# Patient Record
Sex: Male | Born: 1955
Health system: Southern US, Community
[De-identification: ages and names within clinical notes are randomized; demographics above are authoritative.]

## PROBLEM LIST (undated history)

## (undated) DIAGNOSIS — I4891 Unspecified atrial fibrillation: Secondary | ICD-10-CM

## (undated) DIAGNOSIS — N179 Acute kidney failure, unspecified: Secondary | ICD-10-CM

## (undated) DIAGNOSIS — D649 Anemia, unspecified: Secondary | ICD-10-CM

## (undated) DIAGNOSIS — I671 Cerebral aneurysm, nonruptured: Secondary | ICD-10-CM

## (undated) DIAGNOSIS — I639 Cerebral infarction, unspecified: Secondary | ICD-10-CM

## (undated) DIAGNOSIS — I10 Essential (primary) hypertension: Secondary | ICD-10-CM

## (undated) HISTORY — DX: Essential (primary) hypertension: I10

## (undated) HISTORY — DX: Unspecified atrial fibrillation: I48.91

## (undated) HISTORY — PX: KNEE SURGERY: SHX244

## (undated) HISTORY — PX: APPENDECTOMY: SHX54

---

## 2018-12-03 ENCOUNTER — Encounter: Payer: Self-pay | Admitting: Cardiology

## 2018-12-03 ENCOUNTER — Ambulatory Visit (INDEPENDENT_AMBULATORY_CARE_PROVIDER_SITE_OTHER): Payer: Self-pay | Admitting: Cardiology

## 2018-12-03 ENCOUNTER — Other Ambulatory Visit: Payer: Self-pay

## 2018-12-03 VITALS — BP 180/110 | HR 81 | Ht 63.0 in | Wt 181.8 lb

## 2018-12-03 DIAGNOSIS — I10 Essential (primary) hypertension: Secondary | ICD-10-CM

## 2018-12-03 DIAGNOSIS — E785 Hyperlipidemia, unspecified: Secondary | ICD-10-CM

## 2018-12-03 DIAGNOSIS — E669 Obesity, unspecified: Secondary | ICD-10-CM

## 2018-12-03 DIAGNOSIS — I480 Paroxysmal atrial fibrillation: Secondary | ICD-10-CM

## 2018-12-03 HISTORY — DX: Essential (primary) hypertension: I10

## 2018-12-03 HISTORY — DX: Obesity, unspecified: E66.9

## 2018-12-03 HISTORY — DX: Hyperlipidemia, unspecified: E78.5

## 2018-12-03 HISTORY — DX: Paroxysmal atrial fibrillation: I48.0

## 2018-12-03 MED ORDER — CARVEDILOL 6.25 MG PO TABS: 6.2500 mg | ORAL_TABLET | Freq: Two times a day (BID) | ORAL | 1 refills | Status: DC

## 2018-12-03 MED ORDER — APIXABAN 5 MG PO TABS: 5.0000 mg | ORAL_TABLET | Freq: Two times a day (BID) | ORAL | 5 refills | Status: DC

## 2018-12-03 NOTE — Progress Notes (Signed)
Cardiology Office Note:    Date:  12/03/2018   ID:  Blake Carey, DOB 1955-03-04, MRN [id number]  PCP:  Imagene Riches, NP  Cardiologist:  Berniece Salines, DO  Electrophysiologist:  None   Referring MD: Imagene Riches, NP   Patient was referred for atrial fibrillation.  History of Present Illness:    Blake Carey is a 63 y.o. male with a hx of hypertension diagnosed 8 years ago at that time he was told to diet and exercise, atrial fibrillation which was diagnosed yesterday presents today to be evaluated for both his hypertension as well as A. fib.  Today he denies any chest, shortness of breath, nausea, vomiting.  Past Medical History:  Diagnosis Date  . Atrial fibrillation ([place])   . HTN (hypertension)     Past Surgical History:  Procedure Laterality Date  . APPENDECTOMY    . KNEE SURGERY Right     Current Medications: Current Meds  Medication Sig  . lisinopril (ZESTRIL) 10 MG tablet Take 10 mg by mouth daily.     Allergies:   Patient has no known allergies.   Social History   Socioeconomic History  . Marital status: Unknown    Spouse name: Not on file  . Number of children: Not on file  . Years of education: Not on file  . Highest education level: Not on file  Occupational History  . Not on file  Social Needs  . Financial resource strain: Not on file  . Food insecurity    Worry: Not on file    Inability: Not on file  . Transportation needs    Medical: Not on file    Non-medical: Not on file  Tobacco Use  . Smoking status: Never Smoker  . Smokeless tobacco: Never Used  Substance and Sexual Activity  . Alcohol use: Never    Frequency: Never  . Drug use: Never  . Sexual activity: Not on file  Lifestyle  . Physical activity    Days per week: Not on file    Minutes per session: Not on file  . Stress: Not on file  Relationships  . Social Herbalist on phone: Not on file    Gets together: Not on file    Attends religious service: Not on  file    Active member of club or organization: Not on file    Attends meetings of clubs or organizations: Not on file    Relationship status: Not on file  Other Topics Concern  . Not on file  Social History Narrative  . Not on file     Family History: The patient's family history includes Prostate cancer in his father.  ROS:   Review of Systems  Constitution: Negative for decreased appetite, fever and weight gain.  HENT: Negative for congestion, ear discharge, hoarse voice and sore throat.   Eyes: Negative for discharge, redness, vision loss in right eye and visual halos.  Cardiovascular: Negative for chest pain, dyspnea on exertion, leg swelling, orthopnea and palpitations.  Respiratory: Negative for cough, hemoptysis, shortness of breath and snoring.   Endocrine: Negative for heat intolerance and polyphagia.  Hematologic/Lymphatic: Negative for bleeding problem. Does not bruise/bleed easily.  Skin: Negative for flushing, nail changes, rash and suspicious lesions.  Musculoskeletal: Negative for arthritis, joint pain, muscle cramps, myalgias, neck pain and stiffness.  Gastrointestinal: Negative for abdominal pain, bowel incontinence, diarrhea and excessive appetite.  Genitourinary: Negative for decreased libido, genital sores and incomplete emptying.  Neurological:  Negative for brief paralysis, focal weakness, headaches and loss of balance.  Psychiatric/Behavioral: Negative for altered mental status, depression and suicidal ideas.  Allergic/Immunologic: Negative for HIV exposure and persistent infections.    EKGs/Labs/Other Studies Reviewed:    The following studies were reviewed today:   EKG:  The ekg ordered today demonstrates atrial fibrillation with controlled ventricular rate heart rate 81 bpm.  Change from prior EKG performed on 12/01/2018.  Recent Labs: No results found for requested labs within last 8760 hours.  Chemistry: Sodium 140, potassium 4.5, chloride 107, bicarb  27, BUN 19, creatinine 1.03, serum 9.3, albumin 4.2, alk phos 77, AST 23, ALT 19, CBC: WBC 5.9, hemoglobin 14.3, hematocrit 47, PLT 203  Recent Lipid Panel No results found for: CHOL, TRIG, HDL, CHOLHDL, VLDL, LDLCALC, LDLDIRECT   Lipid profile: Performed 12/02/2018 LDL 207, triglyceride 92, HDL 37 total cholesterol 268.  Physical Exam:    VS:  BP (!) 180/110 (BP Location: Right Arm)   Pulse 81   Ht '5\' 3"'  (1.6 m)   Wt 181 lb 12.8 oz (82.5 kg)   BMI 32.20 kg/m     Wt Readings from Last 3 Encounters:  12/03/18 181 lb 12.8 oz (82.5 kg)    GEN: Obese male, well nourished, well developed in no acute distress HEENT: Normal NECK: No JVD; No carotid bruits LYMPHATICS: No lymphadenopathy CARDIAC: S1S2 noted,RRR, no murmurs, rubs, gallops RESPIRATORY:  Clear to auscultation without rales, wheezing or rhonchi  ABDOMEN: Soft, non-tender, non-distended, +bowel sounds, no guarding. EXTREMITIES: No edema, No cyanosis, no clubbing MUSCULOSKELETAL:  No edema; No deformity  SKIN: Warm and dry NEUROLOGIC:  Alert and oriented x 3, non-focal PSYCHIATRIC:  Normal affect, good insight  ASSESSMENT:    1. Essential hypertension   2. Paroxysmal atrial fibrillation (HCC)   3. Dyslipidemia   4. Obesity (BMI 30-39.9)    PLAN:    1.  Hypertension-patient is hypertensive in the office today is manual blood pressure is 180/110 mmHg.  He has been started on lisinopril 10 mg daily which he has not picked up.  Some going to continue him on lisinopril 10 mg daily.  I would going to add Coreg 6.25 mg twice daily.  He has been advised about decrease salt intake.  He tells me he is recently in his diet to more healthy diet and stop eating red meats.  Also has increased his exercise.  2.  Atrial fibrillation-the patient is in atrial fibrillation today in the office.  I have started him on Coreg 25 twice daily which will help with his rate control as well as his blood pressure.  His CHadsVasc Score is 2.  I will  start the patient on anticoagulation with Eliquis 5 mg twice daily.  If this does not happen we will hopefully after 4 weeks be able to safely perform a DC cardioversion.  3.  Hyperlipidemia-h his LDL is 206 and his HDL is 37.  I did educate patient that he should ideally be on a moderate dose intensity statin but at this time he has declined to start this medication.  He prefers to go with lifestyle modification.  Is that he is change his eating habits and he has been losing weight.  I will repeat this testing in 4-8 weeks.   4.  Obesity-the patient understands the need to lose weight with diet and exercise. We have discussed specific strategies for this.  The patient is in agreement with the above plan. The patient left the office  in stable condition.  The patient will follow up in 4 months.    Medication Adjustments/Labs and Tests Ordered: Current medicines are reviewed at length with the patient today.  Concerns regarding medicines are outlined above.  Orders Placed This Encounter  Procedures  . Lipid Profile  . EKG 12-Lead  . ECHOCARDIOGRAM COMPLETE   Meds ordered this encounter  Medications  . carvedilol (COREG) 6.25 MG tablet    Sig: Take 1 tablet (6.25 mg total) by mouth 2 (two) times daily.    Dispense:  180 tablet    Refill:  1  . apixaban (ELIQUIS) 5 MG TABS tablet    Sig: Take 1 tablet (5 mg total) by mouth 2 (two) times daily.    Dispense:  60 tablet    Refill:  5    Patient Instructions  Medication Instructions:  Your physician has recommended you make the following change in your medication:   START: Coreg 6.25 mg Take 1 tablet twice daily with meals START: Eliquis 5 mg Take 1 tablet twice daily  *If you need a refill on your cardiac medications before your next appointment, please call your pharmacy*  Lab Work: Your physician recommends that you return for lab work in:   When you get your echo: Fasting Lipid  If you have labs (blood work) drawn today and  your tests are completely normal, you will receive your results only by: Marland Kitchen MyChart Message (if you have MyChart) OR . A paper copy in the mail If you have any lab test that is abnormal or we need to change your treatment, we will call you to review the results.  Testing/Procedures: Your physician has requested that you have an echocardiogram. Echocardiography is a painless test that uses sound waves to create images of your heart. It provides your doctor with information about the size and shape of your heart and how well your heart's chambers and valves are working. This procedure takes approximately one hour. There are no restrictions for this procedure.   Follow-Up: At [facility], you and your health needs are our priority.  As part of our continuing mission to provide you with exceptional heart care, we have created designated Provider Care Teams.  These Care Teams include your primary Cardiologist (physician) and Advanced Practice Providers (APPs -  Physician Assistants and Nurse Practitioners) who all work together to provide you with the care you need, when you need it.  Your next appointment:   4 weeks  The format for your next appointment:   In Person  Provider:   Berniece Salines, DO  Other Instructions    Ecocardiograma Echocardiogram Un ecocardiograma es un procedimiento que [place] ondas sonoras indoloras (ultrasonido) para obtener una imagen del corazn. Las imgenes de un ecocardiograma pueden proporcionar informacin importante sobre lo siguiente:  Signos de arteriopata coronaria (EAC).  Signos de aneurisma. Un aneurisma es una zona debilitada o daada de la pared de una arteria que se abulta por la fuerza normal del bombeo de la sangre a travs del cuerpo.  Tamao y forma del corazn. Los [employer] tamao y la forma del corazn se pueden asociar a determinadas afecciones, como insuficiencia cardaca, aneurisma y [place].  Funcin del msculo cardaco.  Funcin de la  vlvula cardaca.  Signos de un infarto de miocardio previo.  Acumulacin de lquido alrededor del corazn.  Engrosamiento del msculo cardaco.  Un tumor o crecimiento infeccioso alrededor de las vlvulas cardacas. Informe al mdico acerca de lo siguiente:  Cualquier  alergia que tenga.  Todos los [employer] consume, incluidos vitaminas, hierbas, gotas oftlmicas, cremas y medicamentos de venta libre.  Cualquier enfermedad de la sangre que tenga.  Cirugas a las que se someti.  Cualquier enfermedad que tenga.  Si est embarazada o podra estarlo. Cules son los riesgos? En general, se trata de un procedimiento seguro. Sin embargo, pueden ocurrir complicaciones, por ejemplo:  Reaccin alrgica al tinte de contraste utilizado en el procedimiento. Qu ocurre antes del procedimiento? No se requiere Associate Professor. Podr comer y beber normalmente. Qu ocurre durante el procedimiento?   Pueden colocarle un tubo (catter) intravenoso en una de las venas.  Puede recibir Programmer, multimedia a travs de esta va. Un contraste es una inyeccin que mejora la calidad de las imgenes del corazn.  Le aplicarn un gel sobre el pecho.  Le pasarn un instrumento similar a una vara (transductor) sobre el pecho. El gel ayudar a transmitir las [employer] del transductor.  Las ondas sonoras rebotarn inofensivamente en el corazn para permitir capturar las imgenes del corazn en movimiento, en tiempo real. Las imgenes se registrarn en una computadora. Este procedimiento puede variar segn el mdico y el hospital. Sander Nephew sucede despus del procedimiento?  Puede retomar su rutina diaria normal, incluidos la dieta, las actividades y [employer], a menos que su mdico le indique lo contrario. Resumen  Un ecocardiograma es un procedimiento que [place] ondas sonoras indoloras (ultrasonido) para obtener una imagen del corazn.  Las imgenes de un ecocardiograma pueden brindar  informacin importante sobre el tamao y la forma del corazn, la funcin del msculo cardaco, la funcin de la vlvula cardaca y la acumulacin de lquido alrededor del Training and development officer.  No es necesario prepararse para este procedimiento. Podr comer y beber normalmente.  Al finalizar el ecocardiograma, puede retomar su rutina diaria normal, a menos que su mdico le indique lo contrario. Esta informacin no tiene Marine scientist el consejo del mdico. Asegrese de hacerle al mdico cualquier pregunta que tenga. Document Released: 01/15/2005 Document Revised: 10/25/2016 Document Reviewed: 05/11/2016 Elsevier Patient Education  [place].       Adopting a Healthy Lifestyle.  Know what a healthy weight is for you (roughly BMI <25) and aim to maintain this   Aim for 7+ servings of fruits and vegetables daily   65-80+ fluid ounces of water or unsweet tea for healthy kidneys   Limit to max 1 drink of alcohol per day; avoid smoking/tobacco   Limit animal fats in diet for cholesterol and heart health - choose grass fed whenever available   Avoid highly processed foods, and foods high in saturated/trans fats   Aim for low stress - take time to unwind and care for your mental health   Aim for 150 min of moderate intensity exercise weekly for heart health, and weights twice weekly for bone health   Aim for 7-9 hours of sleep daily   When it comes to diets, agreement about the perfect plan isnt easy to find, even among the experts. Experts at the [place] developed an idea known as the Healthy Eating Plate. Just imagine a plate divided into logical, healthy portions.   The emphasis is on diet quality:   Load up on vegetables and fruits - one-half of your plate: Aim for color and variety, and remember that potatoes dont count.   Go for whole grains - one-quarter of your plate: Whole wheat, barley, wheat berries, quinoa, oats, brown rice, and foods made  with  them. If you want pasta, go with whole wheat pasta.   Protein power - one-quarter of your plate: Fish, chicken, beans, and nuts are all healthy, versatile protein sources. Limit red meat.   The diet, however, does go beyond the plate, offering a few other suggestions.   Use healthy plant oils, such as olive, canola, soy, corn, sunflower and peanut. Check the labels, and avoid partially hydrogenated oil, which have unhealthy trans fats.   If youre thirsty, drink water. Coffee and tea are good in moderation, but skip sugary drinks and limit milk and dairy products to one or two daily servings.   The type of carbohydrate in the diet is more important than the amount. Some sources of carbohydrates, such as vegetables, fruits, whole grains, and beans-are healthier than others.   Finally, stay active  Signed, Berniece Salines, DO  12/03/2018 5:12 PM    [facility]

## 2018-12-03 NOTE — Patient Instructions (Signed)
Medication Instructions:  Your physician has recommended you make the following change in your medication:   START: Coreg 6.25 mg Take 1 tablet twice daily with meals START: Eliquis 5 mg Take 1 tablet twice daily  *If you need a refill on your cardiac medications before your next appointment, please call your pharmacy*  Lab Work: Your physician recommends that you return for lab work in:   When you get your echo: Fasting Lipid  If you have labs (blood work) drawn today and your tests are completely normal, you will receive your results only by: Marland Kitchen MyChart Message (if you have MyChart) OR . A paper copy in the mail If you have any lab test that is abnormal or we need to change your treatment, we will call you to review the results.  Testing/Procedures: Your physician has requested that you have an echocardiogram. Echocardiography is a painless test that uses sound waves to create images of your heart. It provides your doctor with information about the size and shape of your heart and how well your heart's chambers and valves are working. This procedure takes approximately one hour. There are no restrictions for this procedure.   Follow-Up: At [facility], you and your health needs are our priority.  As part of our continuing mission to provide you with exceptional heart care, we have created designated Provider Care Teams.  These Care Teams include your primary Cardiologist (physician) and Advanced Practice Providers (APPs -  Physician Assistants and Nurse Practitioners) who all work together to provide you with the care you need, when you need it.  Your next appointment:   4 weeks  The format for your next appointment:   In Person  Provider:   Berniece Salines, DO  Other Instructions    Ecocardiograma Echocardiogram Un ecocardiograma es un procedimiento que [place] ondas sonoras indoloras (ultrasonido) para obtener una imagen del corazn. Las imgenes de un ecocardiograma pueden  proporcionar informacin importante sobre lo siguiente:  Signos de arteriopata coronaria (EAC).  Signos de aneurisma. Un aneurisma es una zona debilitada o daada de la pared de una arteria que se abulta por la fuerza normal del bombeo de la sangre a travs del cuerpo.  Tamao y forma del corazn. Los [employer] tamao y la forma del corazn se pueden asociar a determinadas afecciones, como insuficiencia cardaca, aneurisma y [place].  Funcin del msculo cardaco.  Funcin de la vlvula cardaca.  Signos de un infarto de miocardio previo.  Acumulacin de lquido alrededor del corazn.  Engrosamiento del msculo cardaco.  Un tumor o crecimiento infeccioso alrededor de las vlvulas cardacas. Informe al mdico acerca de lo siguiente:  Cualquier alergia que tenga.  Todos los [employer] consume, incluidos vitaminas, hierbas, gotas oftlmicas, cremas y medicamentos de venta libre.  Cualquier enfermedad de la sangre que tenga.  Cirugas a las que se someti.  Cualquier enfermedad que tenga.  Si est embarazada o podra estarlo. Cules son los riesgos? En general, se trata de un procedimiento seguro. Sin embargo, pueden ocurrir complicaciones, por ejemplo:  Reaccin alrgica al tinte de contraste utilizado en el procedimiento. Qu ocurre antes del procedimiento? No se requiere Associate Professor. Podr comer y beber normalmente. Qu ocurre durante el procedimiento?   Pueden colocarle un tubo (catter) intravenoso en una de las venas.  Puede recibir Programmer, multimedia a travs de esta va. Un contraste es una inyeccin que mejora la calidad de las imgenes del corazn.  Le aplicarn un gel sobre el pecho.  Marin Comment  pasarn un instrumento similar a una vara (transductor) sobre el pecho. El gel ayudar a transmitir las [employer] del transductor.  Las ondas sonoras rebotarn inofensivamente en el corazn para permitir capturar las imgenes del corazn en movimiento, en  tiempo real. Las imgenes se registrarn en una computadora. Este procedimiento puede variar segn el mdico y el hospital. Ladell Heads sucede despus del procedimiento?  Puede retomar su rutina diaria normal, incluidos la dieta, las actividades y [employer], a menos que su mdico le indique lo contrario. Resumen  Un ecocardiograma es un procedimiento que [place] ondas sonoras indoloras (ultrasonido) para obtener una imagen del corazn.  Las imgenes de un ecocardiograma pueden brindar informacin importante sobre el tamao y la forma del corazn, la funcin del msculo cardaco, la funcin de la vlvula cardaca y la acumulacin de lquido alrededor del Programmer, multimedia.  No es necesario prepararse para este procedimiento. Podr comer y beber normalmente.  Al finalizar el ecocardiograma, puede retomar su rutina diaria normal, a menos que su mdico le indique lo contrario. Esta informacin no tiene Theme park manager el consejo del mdico. Asegrese de hacerle al mdico cualquier pregunta que tenga. Document Released: 01/15/2005 Document Revised: 10/25/2016 Document Reviewed: 05/11/2016 Elsevier Patient Education  2020 [employer].

## 2018-12-09 ENCOUNTER — Telehealth: Payer: Self-pay | Admitting: Cardiology

## 2018-12-09 NOTE — Telephone Encounter (Signed)
Left message to return call 

## 2018-12-09 NOTE — Telephone Encounter (Signed)
Has questions about getting paper work for medications filled out

## 2018-12-17 NOTE — Telephone Encounter (Signed)
Left message that Eliquis paperwork has been filled out and faxed back to the company.

## 2018-12-31 ENCOUNTER — Ambulatory Visit (INDEPENDENT_AMBULATORY_CARE_PROVIDER_SITE_OTHER): Payer: Self-pay | Admitting: Cardiology

## 2018-12-31 ENCOUNTER — Encounter: Payer: Self-pay | Admitting: Cardiology

## 2018-12-31 ENCOUNTER — Other Ambulatory Visit: Payer: Self-pay

## 2018-12-31 VITALS — BP 164/110 | HR 70 | Ht 63.0 in | Wt 181.0 lb

## 2018-12-31 DIAGNOSIS — I480 Paroxysmal atrial fibrillation: Secondary | ICD-10-CM

## 2018-12-31 DIAGNOSIS — Z01812 Encounter for preprocedural laboratory examination: Secondary | ICD-10-CM

## 2018-12-31 DIAGNOSIS — I10 Essential (primary) hypertension: Secondary | ICD-10-CM

## 2018-12-31 HISTORY — DX: Encounter for preprocedural laboratory examination: Z01.812

## 2018-12-31 MED ORDER — CARVEDILOL 12.5 MG PO TABS: 12.5000 mg | ORAL_TABLET | Freq: Two times a day (BID) | ORAL | 1 refills | Status: DC

## 2018-12-31 MED ORDER — LISINOPRIL 20 MG PO TABS: 20.0000 mg | ORAL_TABLET | Freq: Every day | ORAL | 3 refills | Status: DC

## 2018-12-31 NOTE — Patient Instructions (Addendum)
Medication Instructions:  Your physician has recommended you make the following change in your medication:   INCREASE: Coreg (carvedilol) to 12.5 mg Take 1 tab twice daily( May take 2 tabs of 6.25 mg twice daily until you run out) INCREASE: Lisinopril to 20 mg Take 1 tab daily ( May take 2 tabs of 10 mg tabs daily until you run out)  *If you need a refill on your cardiac medications before your next appointment, please call your pharmacy*  Lab Work: NONe  If you have labs (blood work) drawn today and your tests are completely normal, you will receive your results only by: Marland Kitchen MyChart Message (if you have MyChart) OR . A paper copy in the mail If you have any lab test that is abnormal or we need to change your treatment, we will call you to review the results.  Testing/Procedures: NONE  Follow-Up: At [facility], you and your health needs are our priority.  As part of our continuing mission to provide you with exceptional heart care, we have created designated Provider Care Teams.  These Care Teams include your primary Cardiologist (physician) and Advanced Practice Providers (APPs -  Physician Assistants and Nurse Practitioners) who all work together to provide you with the care you need, when you need it.  Your next appointment:   1 month(s)  The format for your next appointment:   In Person  Provider:   Berniece Salines, DO  Other Instructions

## 2018-12-31 NOTE — Progress Notes (Signed)
Cardiology Office Note:    Date:  12/31/2018   ID:  Blake Carey, DOB May 27, 1955, MRN [id number]  PCP:  Dema Severin, NP  Cardiologist:  Thomasene Ripple, DO  Electrophysiologist:  None   Referring MD: Dema Severin, NP   Chief Complaint  Patient presents with  . Follow-up    History of Present Illness:    Blake Carey is a 63 y.o. male with a hx of hypertension diagnosed 8 years ago at that time he was told to diet and exercise, atrial fibrillation which was diagnosed in November 2020.  I did see the patient on December 03, 2018 at that time he was hypertensive and had not started his antihypertensive medication.  He was also in atrial fibrillation so I therefore started the patient on carvedilol 6.25 mg twice a day along with Eliquis 5 mg twice daily.  During that visit I discussed the management plan for atrial fibrillation with the patient and all his questions were answered.  Today he is here for follow-up visit, he denies chest pain,nausea, vomiting or shortness of breath.   Past Medical History:  Diagnosis Date  . Atrial fibrillation (HCC)   . HTN (hypertension)     Past Surgical History:  Procedure Laterality Date  . APPENDECTOMY    . KNEE SURGERY Right     Current Medications: Current Meds  Medication Sig  . apixaban (ELIQUIS) 5 MG TABS tablet Take 1 tablet (5 mg total) by mouth 2 (two) times daily.  . [DISCONTINUED] carvedilol (COREG) 6.25 MG tablet Take 1 tablet (6.25 mg total) by mouth 2 (two) times daily.  . [DISCONTINUED] lisinopril (ZESTRIL) 10 MG tablet Take 10 mg by mouth daily.     Allergies:   Patient has no known allergies.   Social History   Socioeconomic History  . Marital status: Unknown    Spouse name: Not on file  . Number of children: Not on file  . Years of education: Not on file  . Highest education level: Not on file  Occupational History  . Not on file  Social Needs  . Financial resource strain: Not on file  . Food insecurity     Worry: Not on file    Inability: Not on file  . Transportation needs    Medical: Not on file    Non-medical: Not on file  Tobacco Use  . Smoking status: Never Smoker  . Smokeless tobacco: Never Used  Substance and Sexual Activity  . Alcohol use: Never    Frequency: Never  . Drug use: Never  . Sexual activity: Not on file  Lifestyle  . Physical activity    Days per week: Not on file    Minutes per session: Not on file  . Stress: Not on file  Relationships  . Social Musician on phone: Not on file    Gets together: Not on file    Attends religious service: Not on file    Active member of club or organization: Not on file    Attends meetings of clubs or organizations: Not on file    Relationship status: Not on file  Other Topics Concern  . Not on file  Social History Narrative  . Not on file     Family History: The patient's family history includes Prostate cancer in his father.  ROS:   Review of Systems  Constitution: Negative for decreased appetite, fever and weight gain.  HENT: Negative for congestion, ear discharge, hoarse voice  and sore throat.   Eyes: Negative for discharge, redness, vision loss in right eye and visual halos.  Cardiovascular: Negative for chest pain, dyspnea on exertion, leg swelling, orthopnea and palpitations.  Respiratory: Negative for cough, hemoptysis, shortness of breath and snoring.   Endocrine: Negative for heat intolerance and polyphagia.  Hematologic/Lymphatic: Negative for bleeding problem. Does not bruise/bleed easily.  Skin: Negative for flushing, nail changes, rash and suspicious lesions.  Musculoskeletal: Negative for arthritis, joint pain, muscle cramps, myalgias, neck pain and stiffness.  Gastrointestinal: Negative for abdominal pain, bowel incontinence, diarrhea and excessive appetite.  Genitourinary: Negative for decreased libido, genital sores and incomplete emptying.  Neurological: Negative for brief paralysis,  focal weakness, headaches and loss of balance.  Psychiatric/Behavioral: Negative for altered mental status, depression and suicidal ideas.  Allergic/Immunologic: Negative for HIV exposure and persistent infections.    EKGs/Labs/Other Studies Reviewed:    The following studies were reviewed today:  EKG:  The ekg ordered today demonstrates atrial fibrillation, 65 bpm.  Recent Labs: No results found for requested labs within last 8760 hours.  Recent Lipid Panel No results found for: CHOL, TRIG, HDL, CHOLHDL, VLDL, LDLCALC, LDLDIRECT  Physical Exam:    VS:  BP (!) 164/110 (BP Location: Left Arm, Patient Position: Sitting, Cuff Size: Normal)   Pulse 70   Ht 5\' 3"  (1.6 m)   Wt 181 lb (82.1 kg)   SpO2 98%   BMI 32.06 kg/m     Wt Readings from Last 3 Encounters:  12/31/18 181 lb (82.1 kg)  12/03/18 181 lb 12.8 oz (82.5 kg)     GEN: Well nourished, well developed in no acute distress HEENT: Normal NECK: No JVD; No carotid bruits LYMPHATICS: No lymphadenopathy CARDIAC: S1S2 noted,RRR, no murmurs, rubs, gallops RESPIRATORY:  Clear to auscultation without rales, wheezing or rhonchi  ABDOMEN: Soft, non-tender, non-distended, +bowel sounds, no guarding. EXTREMITIES: No edema, No cyanosis, no clubbing MUSCULOSKELETAL:  No edema; No deformity  SKIN: Warm and dry NEUROLOGIC:  Alert and oriented x 3, non-focal PSYCHIATRIC:  Normal affect, good insight  ASSESSMENT:   Hypertension atrial fibrillation Obesity  PLAN:    1.  He is hypertensive in the office today therefore I am going to increase his lisinopril 20 mg daily, as well as his carvedilol to 12.5 mg twice a day.  I will see the patient back in 1 month for blood pressure check.  2.  He remains in atrial fibrillation today, he was encouraged to continue his Eliquis 5 mg twice a day.  Will be on carvedilol for rate control.  He is agreeable for DC cardioversion if we are going to schedule in the near future.  3.  Obesity- the  patient understands the need to lose weight with diet and exercise. We have discussed specific strategies for this.  4.  His echocardiogram is still pending which is scheduled for January 21, 2019.  The patient is in agreement with the above plan. The patient left the office in stable condition.  The patient will follow up in 1 month or sooner if needed   Medication Adjustments/Labs and Tests Ordered: Current medicines are reviewed at length with the patient today.  Concerns regarding medicines are outlined above.  Orders Placed This Encounter  Procedures  . EKG 12-Lead   Meds ordered this encounter  Medications  . carvedilol (COREG) 12.5 MG tablet    Sig: Take 1 tablet (12.5 mg total) by mouth 2 (two) times daily.    Dispense:  180 tablet  Refill:  1  . lisinopril (ZESTRIL) 20 MG tablet    Sig: Take 1 tablet (20 mg total) by mouth daily.    Dispense:  90 tablet    Refill:  3    Patient Instructions  Medication Instructions:  Your physician has recommended you make the following change in your medication:   INCREASE: Coreg (carvedilol) to 12.5 mg Take 1 tab twice daily( May take 2 tabs of 6.25 mg twice daily until you run out) INCREASE: Lisinopril to 20 mg Take 1 tab daily ( May take 2 tabs of 10 mg tabs daily until you run out)  *If you need a refill on your cardiac medications before your next appointment, please call your pharmacy*  Lab Work: NONe  If you have labs (blood work) drawn today and your tests are completely normal, you will receive your results only by: Marland Kitchen MyChart Message (if you have MyChart) OR . A paper copy in the mail If you have any lab test that is abnormal or we need to change your treatment, we will call you to review the results.  Testing/Procedures: NONE  Follow-Up: At [facility], you and your health needs are our priority.  As part of our continuing mission to provide you with exceptional heart care, we have created designated Provider  Care Teams.  These Care Teams include your primary Cardiologist (physician) and Advanced Practice Providers (APPs -  Physician Assistants and Nurse Practitioners) who all work together to provide you with the care you need, when you need it.  Your next appointment:   1 month(s)  The format for your next appointment:   In Person  Provider:   Thomasene Ripple, DO  Other Instructions      Adopting a Healthy Lifestyle.  Know what a healthy weight is for you (roughly BMI <25) and aim to maintain this   Aim for 7+ servings of fruits and vegetables daily   65-80+ fluid ounces of water or unsweet tea for healthy kidneys   Limit to max 1 drink of alcohol per day; avoid smoking/tobacco   Limit animal fats in diet for cholesterol and heart health - choose grass fed whenever available   Avoid highly processed foods, and foods high in saturated/trans fats   Aim for low stress - take time to unwind and care for your mental health   Aim for 150 min of moderate intensity exercise weekly for heart health, and weights twice weekly for bone health   Aim for 7-9 hours of sleep daily   When it comes to diets, agreement about the perfect plan isnt easy to find, even among the experts. Experts at the [facility] [employer] developed an idea known as the Healthy Eating Plate. Just imagine a plate divided into logical, healthy portions.   The emphasis is on diet quality:   Load up on vegetables and fruits - one-half of your plate: Aim for color and variety, and remember that potatoes dont count.   Go for whole grains - one-quarter of your plate: Whole wheat, barley, wheat berries, quinoa, oats, brown rice, and foods made with them. If you want pasta, go with whole wheat pasta.   Protein power - one-quarter of your plate: Fish, chicken, beans, and nuts are all healthy, versatile protein sources. Limit red meat.   The diet, however, does go beyond the plate, offering a few other  suggestions.   Use healthy plant oils, such as olive, canola, soy, corn, sunflower and peanut. Check  the labels, and avoid partially hydrogenated oil, which have unhealthy trans fats.   If youre thirsty, drink water. Coffee and tea are good in moderation, but skip sugary drinks and limit milk and dairy products to one or two daily servings.   The type of carbohydrate in the diet is more important than the amount. Some sources of carbohydrates, such as vegetables, fruits, whole grains, and beans-are healthier than others.   Finally, stay active  Signed, Berniece Salines, DO  12/31/2018 2:32 PM    [place]

## 2019-01-21 ENCOUNTER — Other Ambulatory Visit: Payer: Self-pay

## 2019-02-10 ENCOUNTER — Encounter: Payer: Self-pay | Admitting: Cardiology

## 2019-02-10 ENCOUNTER — Other Ambulatory Visit: Payer: Self-pay

## 2019-02-10 ENCOUNTER — Ambulatory Visit (INDEPENDENT_AMBULATORY_CARE_PROVIDER_SITE_OTHER): Payer: Self-pay | Admitting: Cardiology

## 2019-02-10 ENCOUNTER — Encounter: Payer: Self-pay | Admitting: *Deleted

## 2019-02-10 VITALS — BP 172/92 | HR 70 | Ht 63.0 in | Wt 176.0 lb

## 2019-02-10 DIAGNOSIS — I10 Essential (primary) hypertension: Secondary | ICD-10-CM

## 2019-02-10 DIAGNOSIS — I480 Paroxysmal atrial fibrillation: Secondary | ICD-10-CM

## 2019-02-10 MED ORDER — LISINOPRIL 20 MG PO TABS: 20.0000 mg | ORAL_TABLET | Freq: Two times a day (BID) | ORAL | 1 refills | Status: DC

## 2019-02-10 NOTE — Patient Instructions (Signed)
Medication Instructions:  Your physician has recommended you make the following change in your medication:   INCREASE: Lisinopril to 20 mg Twice daily  *If you need a refill on your cardiac medications before your next appointment, please call your pharmacy*  Lab Work: NOne If you have labs (blood work) drawn today and your tests are completely normal, you will receive your results only by: Marland Kitchen MyChart Message (if you have MyChart) OR . A paper copy in the mail If you have any lab test that is abnormal or we need to change your treatment, we will call you to review the results.  Testing/Procedures: None  Follow-Up: At [facility], you and your health needs are our priority.  As part of our continuing mission to provide you with exceptional heart care, we have created designated Provider Care Teams.  These Care Teams include your primary Cardiologist (physician) and Advanced Practice Providers (APPs -  Physician Assistants and Nurse Practitioners) who all work together to provide you with the care you need, when you need it.  Your next appointment:   2 month(s)  The format for your next appointment:   In Person  Provider:   Thomasene Ripple, DO  Other Instructions

## 2019-02-10 NOTE — Progress Notes (Signed)
Cardiology Office Note:    Date:  02/10/2019   ID:  Blake Carey, DOB 21-Feb-1955, MRN [id number]  PCP:  Dema Severin, NP  Cardiologist:  Thomasene Ripple, DO  Electrophysiologist:  None   Referring MD: Dema Severin, NP    History of Present Illness:    Blake Torresis a 64 y.o.malewith a hx of hypertension diagnosed 8 years ago at that time he was told to diet and exercise, atrial fibrillation which was diagnosed in November 2020.  The patient predominately Spanish therefore he was here with a cone provided intepreter.   I did see the patient on December 03, 2018 at that time he was hypertensive and had not started his antihypertensive medication.  He was also in atrial fibrillation so I therefore started the patient on carvedilol 6.25 mg twice a day along with Eliquis 5 mg twice daily.  During that visit I discussed the management plan for atrial fibrillation with the patient and all his questions were answered.  He presented on January 10, 2019 follow-up visit for hypertension at that time I increase his carvedilol to 12.5 mg daily and his lisinopril to 20 mg daily.  He did have his echocardiogram pending for January 21, 2019 however the patient did not get this testing done at that time.   Today he reports that he is experiencing some coughing at night.  He denies any chest pain, shortness of breath, nausea, vomiting.   Past Medical History:  Diagnosis Date  . Atrial fibrillation (HCC)   . HTN (hypertension)     Past Surgical History:  Procedure Laterality Date  . APPENDECTOMY    . KNEE SURGERY Right     Current Medications: Current Meds  Medication Sig  . apixaban (ELIQUIS) 5 MG TABS tablet Take 1 tablet (5 mg total) by mouth 2 (two) times daily.  . carvedilol (COREG) 12.5 MG tablet Take 1 tablet (12.5 mg total) by mouth 2 (two) times daily.  . [DISCONTINUED] lisinopril (ZESTRIL) 20 MG tablet Take 1 tablet (20 mg total) by mouth daily.     Allergies:   Patient  has no known allergies.   Social History   Socioeconomic History  . Marital status: Unknown    Spouse name: Not on file  . Number of children: Not on file  . Years of education: Not on file  . Highest education level: Not on file  Occupational History  . Not on file  Tobacco Use  . Smoking status: Never Smoker  . Smokeless tobacco: Never Used  Substance and Sexual Activity  . Alcohol use: Never  . Drug use: Never  . Sexual activity: Not on file  Other Topics Concern  . Not on file  Social History Narrative  . Not on file   Social Determinants of Health   Financial Resource Strain:   . Difficulty of Paying Living Expenses: Not on file  Food Insecurity:   . Worried About Programme researcher, broadcasting/film/video in the Last Year: Not on file  . Ran Out of Food in the Last Year: Not on file  Transportation Needs:   . Lack of Transportation (Medical): Not on file  . Lack of Transportation (Non-Medical): Not on file  Physical Activity:   . Days of Exercise per Week: Not on file  . Minutes of Exercise per Session: Not on file  Stress:   . Feeling of Stress : Not on file  Social Connections:   . Frequency of Communication with Friends and Family: Not  on file  . Frequency of Social Gatherings with Friends and Family: Not on file  . Attends Religious Services: Not on file  . Active Member of Clubs or Organizations: Not on file  . Attends Banker Meetings: Not on file  . Marital Status: Not on file     Family History: The patient's family history includes Prostate cancer in his father.  ROS:   Review of Systems  Constitution: Negative for decreased appetite, fever and weight gain.  HENT: Negative for congestion, ear discharge, hoarse voice and sore throat.   Eyes: Negative for discharge, redness, vision loss in right eye and visual halos.  Cardiovascular: Negative for chest pain, dyspnea on exertion, leg swelling, orthopnea and palpitations.  Respiratory: Negative for cough,  hemoptysis, shortness of breath and snoring.   Endocrine: Negative for heat intolerance and polyphagia.  Hematologic/Lymphatic: Negative for bleeding problem. Does not bruise/bleed easily.  Skin: Negative for flushing, nail changes, rash and suspicious lesions.  Musculoskeletal: Negative for arthritis, joint pain, muscle cramps, myalgias, neck pain and stiffness.  Gastrointestinal: Negative for abdominal pain, bowel incontinence, diarrhea and excessive appetite.  Genitourinary: Negative for decreased libido, genital sores and incomplete emptying.  Neurological: Negative for brief paralysis, focal weakness, headaches and loss of balance.  Psychiatric/Behavioral: Negative for altered mental status, depression and suicidal ideas.  Allergic/Immunologic: Negative for HIV exposure and persistent infections.    EKGs/Labs/Other Studies Reviewed:    The following studies were reviewed today:   EKG:  The ekg ordered today demonstrates atrial fibrillation with controlled ventricular rate, heart rate 63 bpm with rare PVCs  Recent Labs: No results found for requested labs within last 8760 hours.  Recent Lipid Panel No results found for: CHOL, TRIG, HDL, CHOLHDL, VLDL, LDLCALC, LDLDIRECT  Physical Exam:    VS:  BP (!) 172/92   Pulse 70   Ht 5\' 3"  (1.6 m)   Wt 176 lb (79.8 kg)   SpO2 98%   BMI 31.18 kg/m     Wt Readings from Last 3 Encounters:  02/10/19 176 lb (79.8 kg)  12/31/18 181 lb (82.1 kg)  12/03/18 181 lb 12.8 oz (82.5 kg)     GEN: Well nourished, well developed in no acute distress HEENT: Normal NECK: No JVD; No carotid bruits LYMPHATICS: No lymphadenopathy CARDIAC: S1S2 noted,RRR, no murmurs, rubs, gallops RESPIRATORY:  Clear to auscultation without rales, wheezing or rhonchi  ABDOMEN: Soft, non-tender, non-distended, +bowel sounds, no guarding. EXTREMITIES: No edema, No cyanosis, no clubbing MUSCULOSKELETAL:  No edema; No deformity  SKIN: Warm and dry NEUROLOGIC:  Alert  and oriented x 3, non-focal PSYCHIATRIC:  Normal affect, good insight  ASSESSMENT:    1. Paroxysmal atrial fibrillation (HCC)   2. Essential hypertension    PLAN:    1.  He is in for atrial fibrillation today I talked to him about moving forward with cardioversion but the patient prefers to do this next month.  He will continue on his Eliquis and his carvedilol at 12.5 mg daily.  Thankfully his rate is controlled.  2.  His blood pressure is improved but not well controlled target is less than 130/80 therefore I will increase his lisinopril to 20 mg twice a day.  3.  I encouraged the patient today to reschedule his echocardiogram so we can be able to understand his LV systolic function well as if there is any other structural abnormalities.  The patient is in agreement with the above plan. The patient left the office in stable  condition.  The patient will follow up in 2 months or sooner if needed  Medication Adjustments/Labs and Tests Ordered: Current medicines are reviewed at length with the patient today.  Concerns regarding medicines are outlined above.  Orders Placed This Encounter  Procedures  . EKG 12-Lead  . ECHOCARDIOGRAM COMPLETE   Meds ordered this encounter  Medications  . lisinopril (ZESTRIL) 20 MG tablet    Sig: Take 1 tablet (20 mg total) by mouth 2 (two) times daily.    Dispense:  180 tablet    Refill:  1    Patient Instructions  Medication Instructions:  Your physician has recommended you make the following change in your medication:   INCREASE: Lisinopril to 20 mg Twice daily  *If you need a refill on your cardiac medications before your next appointment, please call your pharmacy*  Lab Work: NOne If you have labs (blood work) drawn today and your tests are completely normal, you will receive your results only by: Marland Kitchen MyChart Message (if you have MyChart) OR . A paper copy in the mail If you have any lab test that is abnormal or we need to change your  treatment, we will call you to review the results.  Testing/Procedures: None  Follow-Up: At [facility], you and your health needs are our priority.  As part of our continuing mission to provide you with exceptional heart care, we have created designated Provider Care Teams.  These Care Teams include your primary Cardiologist (physician) and Advanced Practice Providers (APPs -  Physician Assistants and Nurse Practitioners) who all work together to provide you with the care you need, when you need it.  Your next appointment:   2 month(s)  The format for your next appointment:   In Person  Provider:   Berniece Salines, DO  Other Instructions      Adopting a Healthy Lifestyle.  Know what a healthy weight is for you (roughly BMI <25) and aim to maintain this   Aim for 7+ servings of fruits and vegetables daily   65-80+ fluid ounces of water or unsweet tea for healthy kidneys   Limit to max 1 drink of alcohol per day; avoid smoking/tobacco   Limit animal fats in diet for cholesterol and heart health - choose grass fed whenever available   Avoid highly processed foods, and foods high in saturated/trans fats   Aim for low stress - take time to unwind and care for your mental health   Aim for 150 min of moderate intensity exercise weekly for heart health, and weights twice weekly for bone health   Aim for 7-9 hours of sleep daily   When it comes to diets, agreement about the perfect plan isnt easy to find, even among the experts. Experts at the [employer] developed an idea known as the Healthy Eating Plate. Just imagine a plate divided into logical, healthy portions.   The emphasis is on diet quality:   Load up on vegetables and fruits - one-half of your plate: Aim for color and variety, and remember that potatoes dont count.   Go for whole grains - one-quarter of your plate: Whole wheat, barley, wheat berries, quinoa, oats, brown rice, and foods made  with them. If you want pasta, go with whole wheat pasta.   Protein power - one-quarter of your plate: Fish, chicken, beans, and nuts are all healthy, versatile protein sources. Limit red meat.   The diet, however, does go beyond the plate, offering a  few other suggestions.   Use healthy plant oils, such as olive, canola, soy, corn, sunflower and peanut. Check the labels, and avoid partially hydrogenated oil, which have unhealthy trans fats.   If youre thirsty, drink water. Coffee and tea are good in moderation, but skip sugary drinks and limit milk and dairy products to one or two daily servings.   The type of carbohydrate in the diet is more important than the amount. Some sources of carbohydrates, such as vegetables, fruits, whole grains, and beans-are healthier than others.   Finally, stay active  Signed, Thomasene Ripple, DO  02/10/2019 2:28 PM    [facility]

## 2019-03-02 ENCOUNTER — Telehealth: Payer: Self-pay | Admitting: *Deleted

## 2019-03-02 DIAGNOSIS — Z01812 Encounter for preprocedural laboratory examination: Secondary | ICD-10-CM

## 2019-03-02 DIAGNOSIS — I480 Paroxysmal atrial fibrillation: Secondary | ICD-10-CM

## 2019-03-02 NOTE — Telephone Encounter (Signed)
Left message to return call regarding cardioversion next week and need for labs to be drawn prior to procedure(CBC and BMP)

## 2019-03-04 NOTE — Telephone Encounter (Signed)
Left message again for patient at home number and cell had no voicemail. Need to have labs drawn for cardioversion that we are trying to set up next week.

## 2019-03-05 NOTE — Telephone Encounter (Signed)
Attempted to call home number. No answer and voicemail full. Mobile number with no voicemail set up.

## 2019-03-10 LAB — BASIC METABOLIC PANEL
BUN/Creatinine Ratio: 12 (ref 10–24)
BUN: 12 mg/dL (ref 8–27)
CO2: 26 mmol/L (ref 20–29)
Calcium: 9.5 mg/dL (ref 8.6–10.2)
Chloride: 105 mmol/L (ref 96–106)
Creatinine, Ser: 1.03 mg/dL (ref 0.76–1.27)
GFR calc Af Amer: 88 mL/min/{1.73_m2} (ref >59)
GFR calc non Af Amer: 76 mL/min/{1.73_m2} (ref >59)
Glucose: 89 mg/dL (ref 65–99)
Potassium: 4.4 mmol/L (ref 3.5–5.2)
Sodium: 142 mmol/L (ref 134–144)

## 2019-03-10 LAB — CBC
Hematocrit: 45 % (ref 37.5–51.0)
Hemoglobin: 13.7 g/dL (ref 13.0–17.7)
MCH: 25.4 pg — ABNORMAL LOW (ref 26.6–33.0)
MCHC: 30.4 g/dL — ABNORMAL LOW (ref 31.5–35.7)
MCV: 84 fL (ref 79–97)
Platelets: 217 10*3/uL (ref 150–450)
RBC: 5.39 x10E6/uL (ref 4.14–5.80)
RDW: 14.6 % (ref 11.6–15.4)
WBC: 6 10*3/uL (ref 3.4–10.8)

## 2019-03-10 NOTE — Telephone Encounter (Signed)
Attempted to call patient daughter(who speaks English) to set up DCCV for this week. Mailbox full and unable to leave message. Will continue to attempt contact later.

## 2019-03-16 ENCOUNTER — Telehealth: Payer: Self-pay | Admitting: Cardiology

## 2019-03-16 ENCOUNTER — Other Ambulatory Visit: Payer: Self-pay | Admitting: *Deleted

## 2019-03-16 MED ORDER — APIXABAN 5 MG PO TABS: 5.0000 mg | ORAL_TABLET | Freq: Two times a day (BID) | ORAL | 3 refills | Status: DC

## 2019-03-16 NOTE — Telephone Encounter (Signed)
Pt c/o medication issue:  1. Name of Medication: apixaban (ELIQUIS) 5 MG TABS tablet    2. How are you currently taking this medication (dosage and times per day)? n/a  3. Are you having a reaction (difficulty breathing--STAT)? no  4. What is your medication issue? Patient's daughter states that they cannot afford this medication and wants to know if there is an alternative to eliquis.

## 2019-03-16 NOTE — Telephone Encounter (Signed)
New message    *STAT* If patient is at the pharmacy, call can be transferred to refill team.   1. Which medications need to be refilled? (please list name of each medication and dose if known) apixaban (ELIQUIS) 5 MG TABS tablet per patient's daughter wants the generic in this medication  2. Which pharmacy/location (including street and city if local pharmacy) is medication to be sent to?[facility] - [place], [place] - [address]  3. Do they need a 30 day or 90 day supply? 90  Patient is out of medication

## 2019-03-16 NOTE — Telephone Encounter (Signed)
Refill sent to  [facility] . There is no generic for Eliquis.

## 2019-03-16 NOTE — Telephone Encounter (Signed)
Telephone call to daughter No answer and voicemail full.

## 2019-03-16 NOTE — Telephone Encounter (Signed)
Called other daughter Darien Ramus. Informed her we have samples at the front desk waiting for them to pick up.Darien Ramus states she will be by today.

## 2019-04-01 ENCOUNTER — Other Ambulatory Visit: Payer: Self-pay

## 2019-04-01 ENCOUNTER — Ambulatory Visit (INDEPENDENT_AMBULATORY_CARE_PROVIDER_SITE_OTHER): Payer: Self-pay

## 2019-04-01 DIAGNOSIS — I480 Paroxysmal atrial fibrillation: Secondary | ICD-10-CM

## 2019-04-01 NOTE — Progress Notes (Signed)
Complete echocardiogram has been performed.  Jimmy Oluwatomisin Deman RDCS, RVT 

## 2019-04-04 ENCOUNTER — Other Ambulatory Visit: Payer: Self-pay | Admitting: Cardiology

## 2019-04-06 ENCOUNTER — Telehealth: Payer: Self-pay

## 2019-04-06 ENCOUNTER — Telehealth: Payer: Self-pay | Admitting: Cardiology

## 2019-04-06 ENCOUNTER — Other Ambulatory Visit: Payer: Self-pay

## 2019-04-06 MED ORDER — CARVEDILOL 12.5 MG PO TABS: 12.5000 mg | ORAL_TABLET | Freq: Two times a day (BID) | ORAL | 1 refills | Status: DC

## 2019-04-06 NOTE — Telephone Encounter (Signed)
Mailed Echo results 04/06/19

## 2019-04-06 NOTE — Telephone Encounter (Signed)
-----   Message from Thomasene Ripple, DO sent at 04/01/2019  4:41 PM EST ----- The echo showed that the heart is not fully relaxing like it should ( diastolic dysfunction) ,but otherwise normal. I will discuss it at the next office visit.

## 2019-04-06 NOTE — Telephone Encounter (Signed)
° ° °*  STAT* If patient is at the pharmacy, call can be transferred to refill team.   1. Which medications need to be refilled? (please list name of each medication and dose if known)   carvedilol (COREG) 12.5 MG tablet     2. Which pharmacy/location (including street and city if local pharmacy) is medication to be sent to? [facility] - [place], [place] - [address]  3. Do they need a 30 day or 90 day supply? 90

## 2019-04-07 ENCOUNTER — Other Ambulatory Visit: Payer: Self-pay | Admitting: *Deleted

## 2019-04-07 MED ORDER — CARVEDILOL 12.5 MG PO TABS: 12.5000 mg | ORAL_TABLET | Freq: Two times a day (BID) | ORAL | 1 refills | Status: DC

## 2019-04-10 ENCOUNTER — Ambulatory Visit: Payer: Self-pay | Admitting: Cardiology

## 2019-05-01 ENCOUNTER — Other Ambulatory Visit: Payer: Self-pay | Admitting: Cardiology

## 2019-05-01 MED ORDER — LISINOPRIL 20 MG PO TABS: 20.0000 mg | ORAL_TABLET | Freq: Two times a day (BID) | ORAL | 2 refills | Status: DC

## 2019-05-01 MED ORDER — CARVEDILOL 12.5 MG PO TABS: 12.5000 mg | ORAL_TABLET | Freq: Two times a day (BID) | ORAL | 2 refills | Status: DC

## 2019-05-01 MED ORDER — APIXABAN 5 MG PO TABS: 5.0000 mg | ORAL_TABLET | Freq: Two times a day (BID) | ORAL | 1 refills | Status: DC

## 2019-05-01 NOTE — Telephone Encounter (Signed)
*  STAT* If patient is at the pharmacy, call can be transferred to refill team.   1. Which medications need to be refilled? (please list name of each medication and dose if known)  apixaban (ELIQUIS) 5 MG TABS tablet carvedilol (COREG) 12.5 MG tablet lisinopril (ZESTRIL) 20 MG tablet  2. Which pharmacy/location (including street and city if local pharmacy) is medication to be sent to?  [facility] - [place], [place] - [address]  3. Do they need a 30 day or 90 day supply? 90

## 2019-05-01 NOTE — Telephone Encounter (Signed)
63 M 79.8 kg, SCr 1.03 (2/21). LOV 1/21 Tobb

## 2019-05-01 NOTE — Addendum Note (Signed)
Addended by: Kacin Dancy, Elmarie Shiley L on: 05/01/2019 03:38 PM   Modules accepted: Orders

## 2019-05-15 ENCOUNTER — Telehealth: Payer: Self-pay | Admitting: Cardiology

## 2019-05-15 ENCOUNTER — Encounter: Payer: Self-pay | Admitting: Cardiology

## 2019-05-15 ENCOUNTER — Other Ambulatory Visit: Payer: Self-pay

## 2019-05-15 ENCOUNTER — Ambulatory Visit (INDEPENDENT_AMBULATORY_CARE_PROVIDER_SITE_OTHER): Payer: Self-pay | Admitting: Cardiology

## 2019-05-15 VITALS — BP 160/76 | HR 58 | Ht 63.0 in | Wt 166.0 lb

## 2019-05-15 DIAGNOSIS — E669 Obesity, unspecified: Secondary | ICD-10-CM

## 2019-05-15 DIAGNOSIS — I480 Paroxysmal atrial fibrillation: Secondary | ICD-10-CM

## 2019-05-15 DIAGNOSIS — I10 Essential (primary) hypertension: Secondary | ICD-10-CM

## 2019-05-15 DIAGNOSIS — E785 Hyperlipidemia, unspecified: Secondary | ICD-10-CM

## 2019-05-15 MED ORDER — AMLODIPINE BESYLATE 2.5 MG PO TABS: 2.5000 mg | ORAL_TABLET | Freq: Every day | ORAL | 1 refills | Status: DC

## 2019-05-15 NOTE — Telephone Encounter (Signed)
New message:    Patient interpreter calling to let the doctor know he is on his way, [facility] Be there in 10 min. If need to reschedule please call.

## 2019-05-15 NOTE — Patient Instructions (Signed)
Medication Instructions:    START TAKING AMLODIPINE 2.5 MG ONCE A DAY   *If you need a refill on your cardiac medications before your next appointment, please call your pharmacy*   Lab Work: NONE ORDERED  TODAY    If you have labs (blood work) drawn today and your tests are completely normal, you will receive your results only by: Marland Kitchen MyChart Message (if you have MyChart) OR . A paper copy in the mail If you have any lab test that is abnormal or we need to change your treatment, we will call you to review the results.   Testing/Procedures: NONE ORDERED  TODAY   Follow-Up: At [facility], you and your health needs are our priority.  As part of our continuing mission to provide you with exceptional heart care, we have created designated Provider Care Teams.  These Care Teams include your primary Cardiologist (physician) and Advanced Practice Providers (APPs -  Physician Assistants and Nurse Practitioners) who all work together to provide you with the care you need, when you need it.  We recommend signing up for the patient portal called "MyChart".  Sign up information is provided on this After Visit Summary.  MyChart is used to connect with patients for Virtual Visits (Telemedicine).  Patients are able to view lab/test results, encounter notes, upcoming appointments, etc.  Non-urgent messages can be sent to your provider as well.   To learn more about what you can do with MyChart, go to [email or web address].    Your next appointment:    1 month(s)  The format for your next appointment:   In Person  Provider:   You will see Thomasene Ripple, DO.  Or, you can be scheduled with the following Advanced Practice Provider on your designated Care Team (at our [facility]):  Gillian Shields, FNP     Other Instructions

## 2019-05-15 NOTE — Progress Notes (Signed)
Cardiology Office Note:    Date:  05/15/2019   ID:  Blake Carey, DOB Feb 16, 1955, MRN [id number]  PCP:  Imagene Riches, NP  Cardiologist:  Berniece Salines, DO  Electrophysiologist:  None   Referring MD: Imagene Riches, NP   Chief Complaint  Patient presents with  . Follow-up    History of Present Illness:    Blake Torresis a 64 y.o.malewith a hx of hypertension diagnosed 8 years ago at that time he was told to diet and exercise, atrial fibrillation which was diagnosedin November 2020.  The patient predominately Spanish therefore he was here with a cone provided intepreter.   I last saw the patient on 02/10/2019, I discussed with him about cardioversion at that time he preferred to have time to consider the option. He is here for a follow up visit today. He tells me that he would rather not under a cardioversion. He states that he has been taking his medications with no interruptions. No other complaints at this time.   Past Medical History:  Diagnosis Date  . Atrial fibrillation ([place])   . HTN (hypertension)     Past Surgical History:  Procedure Laterality Date  . APPENDECTOMY    . KNEE SURGERY Right     Current Medications: Current Meds  Medication Sig  . apixaban (ELIQUIS) 5 MG TABS tablet Take 1 tablet (5 mg total) by mouth 2 (two) times daily.  . carvedilol (COREG) 12.5 MG tablet Take 1 tablet (12.5 mg total) by mouth 2 (two) times daily.  Marland Kitchen lisinopril (ZESTRIL) 20 MG tablet Take 1 tablet (20 mg total) by mouth 2 (two) times daily.     Allergies:   Patient has no known allergies.   Social History   Socioeconomic History  . Marital status: Unknown    Spouse name: Not on file  . Number of children: Not on file  . Years of education: Not on file  . Highest education level: Not on file  Occupational History  . Not on file  Tobacco Use  . Smoking status: Never Smoker  . Smokeless tobacco: Never Used  Substance and Sexual Activity  . Alcohol use: Never  .  Drug use: Never  . Sexual activity: Not on file  Other Topics Concern  . Not on file  Social History Narrative  . Not on file   Social Determinants of Health   Financial Resource Strain:   . Difficulty of Paying Living Expenses:   Food Insecurity:   . Worried About Charity fundraiser in the Last Year:   . Arboriculturist in the Last Year:   Transportation Needs:   . Film/video editor (Medical):   Marland Kitchen Lack of Transportation (Non-Medical):   Physical Activity:   . Days of Exercise per Week:   . Minutes of Exercise per Session:   Stress:   . Feeling of Stress :   Social Connections:   . Frequency of Communication with Friends and Family:   . Frequency of Social Gatherings with Friends and Family:   . Attends Religious Services:   . Active Member of Clubs or Organizations:   . Attends Archivist Meetings:   Marland Kitchen Marital Status:      Family History: The patient's family history includes Prostate cancer in his father.  ROS:   Review of Systems  Constitution: Negative for decreased appetite, fever and weight gain.  HENT: Negative for congestion, ear discharge, hoarse voice and sore throat.   Eyes:  Negative for discharge, redness, vision loss in right eye and visual halos.  Cardiovascular: Negative for chest pain, dyspnea on exertion, leg swelling, orthopnea and palpitations.  Respiratory: Negative for cough, hemoptysis, shortness of breath and snoring.   Endocrine: Negative for heat intolerance and polyphagia.  Hematologic/Lymphatic: Negative for bleeding problem. Does not bruise/bleed easily.  Skin: Negative for flushing, nail changes, rash and suspicious lesions.  Musculoskeletal: Negative for arthritis, joint pain, muscle cramps, myalgias, neck pain and stiffness.  Gastrointestinal: Negative for abdominal pain, bowel incontinence, diarrhea and excessive appetite.  Genitourinary: Negative for decreased libido, genital sores and incomplete emptying.  Neurological:  Negative for brief paralysis, focal weakness, headaches and loss of balance.  Psychiatric/Behavioral: Negative for altered mental status, depression and suicidal ideas.  Allergic/Immunologic: Negative for HIV exposure and persistent infections.    EKGs/Labs/Other Studies Reviewed:    The following studies were reviewed today:   EKG: None today  TTE IMPRESSIONS  3/32021 1. Left ventricular ejection fraction, by estimation, is 60 to 65%. The  left ventricle has normal function. The left ventricle has no regional wall motion abnormalities. Left ventricular diastolic parameters are indeterminate.  2. Right ventricular systolic function is normal. The right ventricular size is normal. There is normal pulmonary artery systolic pressure.  3. Left atrial size was moderately dilated.  4. The mitral valve is normal in structure and function. Trivial mitral valve regurgitation. No evidence of mitral stenosis.  5. The aortic valve is normal in structure and function. Aortic valve regurgitation is mild. No aortic stenosis is present.  6. The inferior vena cava is normal in size with greater than 50%  respiratory variability, suggesting right atrial pressure of 3 mmHg.   Recent Labs: 03/09/2019: BUN 12; Creatinine, Ser 1.03; Hemoglobin 13.7; Platelets 217; Potassium 4.4; Sodium 142  Recent Lipid Panel No results found for: CHOL, TRIG, HDL, CHOLHDL, VLDL, LDLCALC, LDLDIRECT  Physical Exam:    VS:  BP (!) 160/76 (BP Location: Right Arm, Patient Position: Sitting, Cuff Size: Normal)   Pulse (!) 58   Ht 5\' 3"  (1.6 m)   Wt 166 lb (75.3 kg)   SpO2 98%   BMI 29.41 kg/m     Wt Readings from Last 3 Encounters:  05/15/19 166 lb (75.3 kg)  02/10/19 176 lb (79.8 kg)  12/31/18 181 lb (82.1 kg)     GEN: Well nourished, well developed in no acute distress HEENT: Normal NECK: No JVD; No carotid bruits LYMPHATICS: No lymphadenopathy CARDIAC: S1S2 noted,RRR, no murmurs, rubs, gallops RESPIRATORY:   Clear to auscultation without rales, wheezing or rhonchi  ABDOMEN: Soft, non-tender, non-distended, +bowel sounds, no guarding. EXTREMITIES: No edema, No cyanosis, no clubbing MUSCULOSKELETAL:  No deformity  SKIN: Warm and dry NEUROLOGIC:  Alert and oriented x 3, non-focal PSYCHIATRIC:  Normal affect, good insight  ASSESSMENT:    1. Essential hypertension   2. Paroxysmal atrial fibrillation (HCC)   3. Dyslipidemia   4. Obesity (BMI 30-39.9)    PLAN:    He is hypertensive today in the office, he tells me that he has been taking his medication as prescribed.  Since he has been taking her medicine concerned with his increase in blood pressure from going to add amlodipine 2.5 mg to his blood pressure regimen.  Of educated patient about this medication he is agreeable to take this medication.  He has opted for rate control of atrial fibrillation and does not want to undergo any cardioversion.  So can continue him on his carvedilol 12.5  mg twice daily and Eliquis 5 mg twice a day.  He tells me that he would prefer to be on another reasonable anticoagulation.  I discussed the use of Coumadin with the patient but then he declined.  For now he states he will continue with the Eliquis. He was given sample today.   Hyperlipidemia - he prefers diet modification.   Obesity - he has lost 10 lbs since his last visit I did congratulate the patient he is very happy about his weight loss.  The patient is in agreement with the above plan. The patient left the office in stable condition.  The patient will follow up in 1 month due to his medication change.  Medication Adjustments/Labs and Tests Ordered: Current medicines are reviewed at length with the patient today.  Concerns regarding medicines are outlined above.  No orders of the defined types were placed in this encounter.  Meds ordered this encounter  Medications  . amLODipine (NORVASC) 2.5 MG tablet    Sig: Take 1 tablet (2.5 mg total) by mouth  daily.    Dispense:  180 tablet    Refill:  1    Patient Instructions  Medication Instructions:    START TAKING AMLODIPINE 2.5 MG ONCE A DAY   *If you need a refill on your cardiac medications before your next appointment, please call your pharmacy*   Lab Work: NONE ORDERED  TODAY    If you have labs (blood work) drawn today and your tests are completely normal, you will receive your results only by: Marland Kitchen MyChart Message (if you have MyChart) OR . A paper copy in the mail If you have any lab test that is abnormal or we need to change your treatment, we will call you to review the results.   Testing/Procedures: NONE ORDERED  TODAY   Follow-Up: At [facility], you and your health needs are our priority.  As part of our continuing mission to provide you with exceptional heart care, we have created designated Provider Care Teams.  These Care Teams include your primary Cardiologist (physician) and Advanced Practice Providers (APPs -  Physician Assistants and Nurse Practitioners) who all work together to provide you with the care you need, when you need it.  We recommend signing up for the patient portal called "MyChart".  Sign up information is provided on this After Visit Summary.  MyChart is used to connect with patients for Virtual Visits (Telemedicine).  Patients are able to view lab/test results, encounter notes, upcoming appointments, etc.  Non-urgent messages can be sent to your provider as well.   To learn more about what you can do with MyChart, go to [email or web address].    Your next appointment:    1 month(s)  The format for your next appointment:   In Person  Provider:   You will see Thomasene Ripple, DO.  Or, you can be scheduled with the following Advanced Practice Provider on your designated Care Team (at our [facility]):  Gillian Shields, FNP     Other Instructions   Adopting a Healthy Lifestyle.  Know what a healthy weight is for you (roughly BMI  <25) and aim to maintain this   Aim for 7+ servings of fruits and vegetables daily   65-80+ fluid ounces of water or unsweet tea for healthy kidneys   Limit to max 1 drink of alcohol per day; avoid smoking/tobacco   Limit animal fats in diet for cholesterol and heart health - choose grass fed whenever available  Avoid highly processed foods, and foods high in saturated/trans fats   Aim for low stress - take time to unwind and care for your mental health   Aim for 150 min of moderate intensity exercise weekly for heart health, and weights twice weekly for bone health   Aim for 7-9 hours of sleep daily   When it comes to diets, agreement about the perfect plan isnt easy to find, even among the experts. Experts at the [facility] [employer] developed an idea known as the Healthy Eating Plate. Just imagine a plate divided into logical, healthy portions.   The emphasis is on diet quality:   Load up on vegetables and fruits - one-half of your plate: Aim for color and variety, and remember that potatoes dont count.   Go for whole grains - one-quarter of your plate: Whole wheat, barley, wheat berries, quinoa, oats, brown rice, and foods made with them. If you want pasta, go with whole wheat pasta.   Protein power - one-quarter of your plate: Fish, chicken, beans, and nuts are all healthy, versatile protein sources. Limit red meat.   The diet, however, does go beyond the plate, offering a few other suggestions.   Use healthy plant oils, such as olive, canola, soy, corn, sunflower and peanut. Check the labels, and avoid partially hydrogenated oil, which have unhealthy trans fats.   If youre thirsty, drink water. Coffee and tea are good in moderation, but skip sugary drinks and limit milk and dairy products to one or two daily servings.   The type of carbohydrate in the diet is more important than the amount. Some sources of carbohydrates, such as vegetables, fruits, whole  grains, and beans-are healthier than others.   Finally, stay active  Signed, Thomasene Ripple, DO  05/15/2019 10:47 PM    [facility]

## 2019-06-12 ENCOUNTER — Other Ambulatory Visit: Payer: Self-pay

## 2019-06-12 ENCOUNTER — Ambulatory Visit (INDEPENDENT_AMBULATORY_CARE_PROVIDER_SITE_OTHER): Payer: Self-pay | Admitting: Cardiology

## 2019-06-12 ENCOUNTER — Telehealth: Payer: Self-pay

## 2019-06-12 ENCOUNTER — Encounter: Payer: Self-pay | Admitting: Cardiology

## 2019-06-12 VITALS — BP 160/88 | HR 67 | Ht 63.0 in | Wt 168.0 lb

## 2019-06-12 DIAGNOSIS — I480 Paroxysmal atrial fibrillation: Secondary | ICD-10-CM

## 2019-06-12 DIAGNOSIS — E669 Obesity, unspecified: Secondary | ICD-10-CM

## 2019-06-12 DIAGNOSIS — I10 Essential (primary) hypertension: Secondary | ICD-10-CM

## 2019-06-12 DIAGNOSIS — E785 Hyperlipidemia, unspecified: Secondary | ICD-10-CM

## 2019-06-12 LAB — BASIC METABOLIC PANEL
BUN/Creatinine Ratio: 22 (ref 10–24)
BUN: 22 mg/dL (ref 8–27)
CO2: 23 mmol/L (ref 20–29)
Calcium: 9.8 mg/dL (ref 8.6–10.2)
Chloride: 100 mmol/L (ref 96–106)
Creatinine, Ser: 0.99 mg/dL (ref 0.76–1.27)
GFR calc Af Amer: 92 mL/min/{1.73_m2} (ref >59)
GFR calc non Af Amer: 80 mL/min/{1.73_m2} (ref >59)
Glucose: 151 mg/dL — ABNORMAL HIGH (ref 65–99)
Potassium: 4.5 mmol/L (ref 3.5–5.2)
Sodium: 137 mmol/L (ref 134–144)

## 2019-06-12 LAB — CBC
Hematocrit: 44.6 % (ref 37.5–51.0)
Hemoglobin: 13.8 g/dL (ref 13.0–17.7)
MCH: 25.9 pg — ABNORMAL LOW (ref 26.6–33.0)
MCHC: 30.9 g/dL — ABNORMAL LOW (ref 31.5–35.7)
MCV: 84 fL (ref 79–97)
Platelets: 231 10*3/uL (ref 150–450)
RBC: 5.32 x10E6/uL (ref 4.14–5.80)
RDW: 15 % (ref 11.6–15.4)
WBC: 12.8 10*3/uL — ABNORMAL HIGH (ref 3.4–10.8)

## 2019-06-12 MED ORDER — AMLODIPINE BESYLATE 5 MG PO TABS: 5.0000 mg | ORAL_TABLET | Freq: Every day | ORAL | 3 refills | Status: DC

## 2019-06-12 MED ORDER — HYDROCHLOROTHIAZIDE 12.5 MG PO CAPS: 12.5000 mg | ORAL_CAPSULE | Freq: Every day | ORAL | 3 refills | Status: DC

## 2019-06-12 NOTE — Patient Instructions (Addendum)
Medication Instructions:  Your physician has recommended you make the following change in your medication:  START: HCTZ 12.5 MG take one tablet by mouth daily.  INCREASE: Amlodipine 5 mg take one tablet by mouth daily.  *If you need a refill on your cardiac medications before your next appointment, please call your pharmacy*   Lab Work: Your physician recommends that you return for lab work in: TODAY  BMP, CBC If you have labs (blood work) drawn today and your tests are completely normal, you will receive your results only by: Marland Kitchen MyChart Message (if you have MyChart) OR . A paper copy in the mail If you have any lab test that is abnormal or we need to change your treatment, we will call you to review the results.   Testing/Procedures: You are scheduled for a Cardioversion on ________ AT [facility] [address], [place], [place] [address]  DIET: Nothing to eat or drink after midnight except a sip of water with medications (see medication instructions below)  Medication Instructions:  Continue your anticoagulant: Eliquis You will need to continue your anticoagulant after your procedure until you are told by your  Provider that it is safe to stop    You must have a responsible person to drive you home and stay in the waiting area during your procedure. Failure to do so could result in cancellation.  Bring your insurance cards.  *Special Note: Every effort is made to have your procedure done on time. Occasionally there are emergencies that occur at the hospital that may cause delays. Please be patient if a delay does occur.     Follow-Up: At [facility], you and your health needs are our priority.  As part of our continuing mission to provide you with exceptional heart care, we have created designated Provider Care Teams.  These Care Teams include your primary Cardiologist (physician) and Advanced Practice Providers (APPs -  Physician Assistants and Nurse Practitioners) who all  work together to provide you with the care you need, when you need it.  We recommend signing up for the patient portal called "MyChart".  Sign up information is provided on this After Visit Summary.  MyChart is used to connect with patients for Virtual Visits (Telemedicine).  Patients are able to view lab/test results, encounter notes, upcoming appointments, etc.  Non-urgent messages can be sent to your provider as well.   To learn more about what you can do with MyChart, go to [email or web address].    Your next appointment:   1 month(s)  The format for your next appointment:   In Person  Provider:   Thomasene Ripple, DO   Other Instructions You will need to have a COVID swab done prior to your cardioversion. Once the cardioversion is scheduled we will call you and let you know and let you know when you need to get your COVID swab done.

## 2019-06-12 NOTE — Telephone Encounter (Addendum)
This telephone encounter was just to make documentation that Dr. Servando Salina has decided to post-pone scheduling this patients cardioversion at [facility]. She states that she would like to discuss this with him again at his next follow up visit and will then schedule it at [facility].  Tried calling the patient to let him know this but his phone went straight to voicemail and his mailbox was full so I could not leave a message.

## 2019-06-12 NOTE — Progress Notes (Signed)
Cardiology Office Note:    Date:  06/12/2019   ID:  Blake Carey, DOB Dec 03, 1955, MRN [id number]  PCP:  Dema Severin, NP  Cardiologist:  Thomasene Ripple, DO  Electrophysiologist:  None   Referring MD: Dema Severin, NP   Chief Complaint  Patient presents with  . Follow-up    History of Present Illness:    Blake Torresis a 64 y.o.malewith a hx of hypertension diagnosed 8 years ago at that time he was told to diet and exercise, atrial fibrillation which was diagnosedin November 2020.The patient predominately Spanish therefore he was here with a cone provided intepreter.  I last saw the patient on 02/10/2019, I discussed with him about cardioversion at that time he preferred to have time to consider the option. He is here for a follow up visit today. He tells me that he would rather not under a cardioversion. He states that he has been taking his medications with no interruptions. No other complaints at this time. \  I did see the patient on 05/15/2019 at that time his blood pressure regimen.  In addition, we discussed cardioversion and the patient was not ready to do so.   Is here today for follow-up visit.  He tells me that he ended up in emergency department [facility] for shortness of breath 02/16/2019 at that time he underwent a coronary CTA which showed no pulmonary embolism.  He has partial complete calcified gallstones with mild aortic atherosclerosis.  There is concern for bronchitis he may was given steroids as well as medicine.  He had albuterol inhaler given to him as well.  He reports that he feels a lot better.  Past Medical History:  Diagnosis Date  . Atrial fibrillation (HCC)   . HTN (hypertension)     Past Surgical History:  Procedure Laterality Date  . APPENDECTOMY    . KNEE SURGERY Right     Current Medications: Current Meds  Medication Sig  . apixaban (ELIQUIS) 5 MG TABS tablet Take 1 tablet (5 mg total) by mouth 2 (two) times daily.  .  benzonatate (TESSALON) 100 MG capsule Take 100 mg by mouth 3 (three) times daily as needed.  . carvedilol (COREG) 12.5 MG tablet Take 1 tablet (12.5 mg total) by mouth 2 (two) times daily.  Marland Kitchen lisinopril (ZESTRIL) 20 MG tablet Take 20 mg by mouth daily.  Marland Kitchen PREDNISONE PO Take by mouth as directed.  . [DISCONTINUED] amLODipine (NORVASC) 2.5 MG tablet Take 1 tablet (2.5 mg total) by mouth daily.     Allergies:   Patient has no known allergies.   Social History   Socioeconomic History  . Marital status: Unknown    Spouse name: Not on file  . Number of children: Not on file  . Years of education: Not on file  . Highest education level: Not on file  Occupational History  . Not on file  Tobacco Use  . Smoking status: Never Smoker  . Smokeless tobacco: Never Used  Substance and Sexual Activity  . Alcohol use: Never  . Drug use: Never  . Sexual activity: Not on file  Other Topics Concern  . Not on file  Social History Narrative  . Not on file   Social Determinants of Health   Financial Resource Strain:   . Difficulty of Paying Living Expenses:   Food Insecurity:   . Worried About Programme researcher, broadcasting/film/video in the Last Year:   . [employer] of Food in the Last Year:  Transportation Needs:   . Freight forwarder (Medical):   Marland Kitchen Lack of Transportation (Non-Medical):   Physical Activity:   . Days of Exercise per Week:   . Minutes of Exercise per Session:   Stress:   . Feeling of Stress :   Social Connections:   . Frequency of Communication with Friends and Family:   . Frequency of Social Gatherings with Friends and Family:   . Attends Religious Services:   . Active Member of Clubs or Organizations:   . Attends Banker Meetings:   Marland Kitchen Marital Status:      Family History: The patient's family history includes Prostate cancer in his father.  ROS:   Review of Systems  Constitution: Negative for decreased appetite, fever and weight gain.  HENT: Negative for congestion,  ear discharge, hoarse voice and sore throat.   Eyes: Negative for discharge, redness, vision loss in right eye and visual halos.  Cardiovascular: Negative for chest pain, dyspnea on exertion, leg swelling, orthopnea and palpitations.  Respiratory: Negative for cough, hemoptysis, shortness of breath and snoring.   Endocrine: Negative for heat intolerance and polyphagia.  Hematologic/Lymphatic: Negative for bleeding problem. Does not bruise/bleed easily.  Skin: Negative for flushing, nail changes, rash and suspicious lesions.  Musculoskeletal: Negative for arthritis, joint pain, muscle cramps, myalgias, neck pain and stiffness.  Gastrointestinal: Negative for abdominal pain, bowel incontinence, diarrhea and excessive appetite.  Genitourinary: Negative for decreased libido, genital sores and incomplete emptying.  Neurological: Negative for brief paralysis, focal weakness, headaches and loss of balance.  Psychiatric/Behavioral: Negative for altered mental status, depression and suicidal ideas.  Allergic/Immunologic: Negative for HIV exposure and persistent infections.    EKGs/Labs/Other Studies Reviewed:    The following studies were reviewed today:   EKG: None today  TTE IMPRESSIONS  3/32021 1. Left ventricular ejection fraction, by estimation, is 60 to 65%. The  left ventricle has normal function. The left ventricle has no regional wall motion abnormalities. Left ventricular diastolic parameters are indeterminate.  2. Right ventricular systolic function is normal. The right ventricular size is normal. There is normal pulmonary artery systolic pressure.  3. Left atrial size was moderately dilated.  4. The mitral valve is normal in structure and function. Trivial mitral valve regurgitation. No evidence of mitral stenosis.  5. The aortic valve is normal in structure and function. Aortic valve regurgitation is mild. No aortic stenosis is present.  6. The inferior vena cava is normal in  size with greater than 50%  respiratory variability, suggesting right atrial pressure of 3 mmHg.    Recent Labs: 03/09/2019: BUN 12; Creatinine, Ser 1.03; Hemoglobin 13.7; Platelets 217; Potassium 4.4; Sodium 142  Recent Lipid Panel No results found for: CHOL, TRIG, HDL, CHOLHDL, VLDL, LDLCALC, LDLDIRECT  Physical Exam:    VS:  BP (!) 160/88 (BP Location: Right Arm, Patient Position: Sitting, Cuff Size: Normal)   Pulse 67   Ht 5\' 3"  (1.6 m)   Wt 168 lb (76.2 kg)   SpO2 98%   BMI 29.76 kg/m     Wt Readings from Last 3 Encounters:  06/12/19 168 lb (76.2 kg)  05/15/19 166 lb (75.3 kg)  02/10/19 176 lb (79.8 kg)     GEN: Well nourished, well developed in no acute distress HEENT: Normal NECK: No JVD; No carotid bruits LYMPHATICS: No lymphadenopathy CARDIAC: S1S2 noted,RRR, no murmurs, rubs, gallops RESPIRATORY:  Clear to auscultation without rales, wheezing or rhonchi  ABDOMEN: Soft, non-tender, non-distended, +bowel sounds, no guarding. EXTREMITIES:  No edema, No cyanosis, no clubbing MUSCULOSKELETAL:  No deformity  SKIN: Warm and dry NEUROLOGIC:  Alert and oriented x 3, non-focal PSYCHIATRIC:  Normal affect, good insight  ASSESSMENT:    1. Essential hypertension   2. Paroxysmal atrial fibrillation (HCC)   3. Dyslipidemia   4. Obesity (BMI 30-39.9)    PLAN:     He tells me that he take his blood pressure medication is present but his pressure remains to be elevated.  I am going to his amlodipine to 5 mg daily and add hydrochlorothiazide 12.5 mg daily to his regimen which also includes carvedilol 12.5 mg daily.  Hoping that his regimen can bring him down to less than 130/80.  For now discussed possibility of cardioversion.  Beginning to open up on this possibility.  For now we will continue his Eliquis 5 mg twice a day and rate control with carvedilol.  His next visit will be able to schedule his cardioversion. Blood work done which will include BMP and  mag. Hyperlipidemia-patient prefers diet modification at this time.   The patient is in agreement with the above plan. The patient left the office in stable condition.  The patient will follow up in a month or sooner if needed.   Medication Adjustments/Labs and Tests Ordered: Current medicines are reviewed at length with the patient today.  Concerns regarding medicines are outlined above.  Orders Placed This Encounter  Procedures  . Basic Metabolic Panel (BMET)  . CBC   Meds ordered this encounter  Medications  . hydrochlorothiazide (MICROZIDE) 12.5 MG capsule    Sig: Take 1 capsule (12.5 mg total) by mouth daily.    Dispense:  90 capsule    Refill:  3  . amLODipine (NORVASC) 5 MG tablet    Sig: Take 1 tablet (5 mg total) by mouth daily.    Dispense:  90 tablet    Refill:  3    Patient Instructions  Medication Instructions:  Your physician has recommended you make the following change in your medication:  START: HCTZ 12.5 MG take one tablet by mouth daily.  INCREASE: Amlodipine 5 mg take one tablet by mouth daily.  *If you need a refill on your cardiac medications before your next appointment, please call your pharmacy*   Lab Work: Your physician recommends that you return for lab work in: TODAY  BMP, CBC If you have labs (blood work) drawn today and your tests are completely normal, you will receive your results only by: Marland Kitchen MyChart Message (if you have MyChart) OR . A paper copy in the mail If you have any lab test that is abnormal or we need to change your treatment, we will call you to review the results.   Testing/Procedures: You are scheduled for a Cardioversion on ________ AT [facility] [address] [place], [place] [address]  DIET: Nothing to eat or drink after midnight except a sip of water with medications (see medication instructions below)  Medication Instructions:  Continue your anticoagulant: Eliquis You will need to continue your anticoagulant after  your procedure until you are told by your  Provider that it is safe to stop    You must have a responsible person to drive you home and stay in the waiting area during your procedure. Failure to do so could result in cancellation.  Bring your insurance cards.  *Special Note: Every effort is made to have your procedure done on time. Occasionally there are emergencies that occur at the hospital that  may cause delays. Please be patient if a delay does occur.     Follow-Up: At [facility], you and your health needs are our priority.  As part of our continuing mission to provide you with exceptional heart care, we have created designated Provider Care Teams.  These Care Teams include your primary Cardiologist (physician) and Advanced Practice Providers (APPs -  Physician Assistants and Nurse Practitioners) who all work together to provide you with the care you need, when you need it.  We recommend signing up for the patient portal called "MyChart".  Sign up information is provided on this After Visit Summary.  MyChart is used to connect with patients for Virtual Visits (Telemedicine).  Patients are able to view lab/test results, encounter notes, upcoming appointments, etc.  Non-urgent messages can be sent to your provider as well.   To learn more about what you can do with MyChart, go to [email or web address].    Your next appointment:   1 month(s)  The format for your next appointment:   In Person  Provider:   Berniece Salines, DO   Other Instructions You will need to have a COVID swab done prior to your cardioversion. Once the cardioversion is scheduled we will call you and let you know and let you know when you need to get your COVID swab done.      Adopting a Healthy Lifestyle.  Know what a healthy weight is for you (roughly BMI <25) and aim to maintain this   Aim for 7+ servings of fruits and vegetables daily   65-80+ fluid ounces of water or unsweet tea for healthy kidneys    Limit to max 1 drink of alcohol per day; avoid smoking/tobacco   Limit animal fats in diet for cholesterol and heart health - choose grass fed whenever available   Avoid highly processed foods, and foods high in saturated/trans fats   Aim for low stress - take time to unwind and care for your mental health   Aim for 150 min of moderate intensity exercise weekly for heart health, and weights twice weekly for bone health   Aim for 7-9 hours of sleep daily   When it comes to diets, agreement about the perfect plan isnt easy to find, even among the experts. Experts at the [employer] developed an idea known as the Healthy Eating Plate. Just imagine a plate divided into logical, healthy portions.   The emphasis is on diet quality:   Load up on vegetables and fruits - one-half of your plate: Aim for color and variety, and remember that potatoes dont count.   Go for whole grains - one-quarter of your plate: Whole wheat, barley, wheat berries, quinoa, oats, brown rice, and foods made with them. If you want pasta, go with whole wheat pasta.   Protein power - one-quarter of your plate: Fish, chicken, beans, and nuts are all healthy, versatile protein sources. Limit red meat.   The diet, however, does go beyond the plate, offering a few other suggestions.   Use healthy plant oils, such as olive, canola, soy, corn, sunflower and peanut. Check the labels, and avoid partially hydrogenated oil, which have unhealthy trans fats.   If youre thirsty, drink water. Coffee and tea are good in moderation, but skip sugary drinks and limit milk and dairy products to one or two daily servings.   The type of carbohydrate in the diet is more important than the amount. Some sources of carbohydrates, such as vegetables,  fruits, whole grains, and beans-are healthier than others.   Finally, stay active  Signed, Thomasene RippleKardie Vishal Sandlin, DO  06/12/2019 10:01 AM    [facility]

## 2019-06-12 NOTE — Telephone Encounter (Signed)
Telephone note started in error 

## 2019-06-15 ENCOUNTER — Telehealth: Payer: Self-pay

## 2019-06-15 ENCOUNTER — Telehealth: Payer: Self-pay | Admitting: Cardiology

## 2019-06-15 NOTE — Telephone Encounter (Signed)
-----   Message from Thomasene Ripple, DO sent at 06/12/2019  6:22 PM EDT ----- Wbc and blood glucose elevated. Other labs normal

## 2019-06-15 NOTE — Telephone Encounter (Signed)
New message   Patient's daughter has a question about patient's medication list and dosage of the medications. Please call to discuss.

## 2019-06-15 NOTE — Telephone Encounter (Signed)
Tried calling patient. No answer and no voicemail set up for me to leave a message.  Results were reviewed on my chart on 06/15/19

## 2019-06-15 NOTE — Telephone Encounter (Signed)
I spoke to the patients daughter Blake Carey who requested that I call the other daughterSunny Carey. I spoke with the patient briefly and he stated that it would be fine for [place] to speak with both of these daughters. When trying to call [name] at [phone] I did not get an answer and the voicemail was full so I could not leave a message for her to call back.

## 2019-06-15 NOTE — Telephone Encounter (Signed)
Spoke with patient through My Chart. 

## 2019-06-20 ENCOUNTER — Encounter (HOSPITAL_COMMUNITY)
Admission: EM | Disposition: A | Discharge status: Home / Self Care | Payer: Self-pay | Source: Home / Self Care | Attending: Neurology

## 2019-06-20 ENCOUNTER — Emergency Department (HOSPITAL_COMMUNITY): Payer: Self-pay

## 2019-06-20 ENCOUNTER — Encounter (HOSPITAL_COMMUNITY): Payer: Self-pay | Admitting: Anesthesiology

## 2019-06-20 ENCOUNTER — Inpatient Hospital Stay (HOSPITAL_COMMUNITY)
Admission: EM | Admit: 2019-06-20 | Discharge: 2019-06-26 | DRG: 024 | Disposition: A | Discharge status: Home / Self Care | Payer: Self-pay | Attending: Neurology | Admitting: Neurology

## 2019-06-20 ENCOUNTER — Other Ambulatory Visit: Payer: Self-pay

## 2019-06-20 ENCOUNTER — Emergency Department (HOSPITAL_COMMUNITY): Payer: Self-pay | Admitting: Anesthesiology

## 2019-06-20 DIAGNOSIS — E876 Hypokalemia: Secondary | ICD-10-CM | POA: Diagnosis not present

## 2019-06-20 DIAGNOSIS — Z7901 Long term (current) use of anticoagulants: Secondary | ICD-10-CM

## 2019-06-20 DIAGNOSIS — B9620 Unspecified Escherichia coli [E. coli] as the cause of diseases classified elsewhere: Secondary | ICD-10-CM | POA: Diagnosis present

## 2019-06-20 DIAGNOSIS — I351 Nonrheumatic aortic (valve) insufficiency: Secondary | ICD-10-CM | POA: Diagnosis present

## 2019-06-20 DIAGNOSIS — Z20822 Contact with and (suspected) exposure to covid-19: Secondary | ICD-10-CM | POA: Diagnosis present

## 2019-06-20 DIAGNOSIS — R4701 Aphasia: Secondary | ICD-10-CM | POA: Diagnosis present

## 2019-06-20 DIAGNOSIS — Z79899 Other long term (current) drug therapy: Secondary | ICD-10-CM

## 2019-06-20 DIAGNOSIS — E669 Obesity, unspecified: Secondary | ICD-10-CM | POA: Diagnosis present

## 2019-06-20 DIAGNOSIS — Z8042 Family history of malignant neoplasm of prostate: Secondary | ICD-10-CM

## 2019-06-20 DIAGNOSIS — I959 Hypotension, unspecified: Secondary | ICD-10-CM | POA: Diagnosis not present

## 2019-06-20 DIAGNOSIS — N179 Acute kidney failure, unspecified: Secondary | ICD-10-CM | POA: Diagnosis present

## 2019-06-20 DIAGNOSIS — N390 Urinary tract infection, site not specified: Secondary | ICD-10-CM | POA: Diagnosis present

## 2019-06-20 DIAGNOSIS — E785 Hyperlipidemia, unspecified: Secondary | ICD-10-CM | POA: Diagnosis present

## 2019-06-20 DIAGNOSIS — R509 Fever, unspecified: Secondary | ICD-10-CM

## 2019-06-20 DIAGNOSIS — T508X5A Adverse effect of diagnostic agents, initial encounter: Secondary | ICD-10-CM | POA: Diagnosis not present

## 2019-06-20 DIAGNOSIS — I63412 Cerebral infarction due to embolism of left middle cerebral artery: Principal | ICD-10-CM | POA: Diagnosis present

## 2019-06-20 DIAGNOSIS — G8191 Hemiplegia, unspecified affecting right dominant side: Secondary | ICD-10-CM | POA: Diagnosis present

## 2019-06-20 DIAGNOSIS — R29720 NIHSS score 20: Secondary | ICD-10-CM | POA: Diagnosis present

## 2019-06-20 DIAGNOSIS — N141 Nephropathy induced by other drugs, medicaments and biological substances: Secondary | ICD-10-CM | POA: Diagnosis not present

## 2019-06-20 DIAGNOSIS — I480 Paroxysmal atrial fibrillation: Secondary | ICD-10-CM | POA: Diagnosis present

## 2019-06-20 DIAGNOSIS — Y92239 Unspecified place in hospital as the place of occurrence of the external cause: Secondary | ICD-10-CM | POA: Diagnosis not present

## 2019-06-20 DIAGNOSIS — Z6829 Body mass index (BMI) 29.0-29.9, adult: Secondary | ICD-10-CM

## 2019-06-20 DIAGNOSIS — I672 Cerebral atherosclerosis: Secondary | ICD-10-CM | POA: Diagnosis present

## 2019-06-20 DIAGNOSIS — I671 Cerebral aneurysm, nonruptured: Secondary | ICD-10-CM

## 2019-06-20 DIAGNOSIS — I63013 Cerebral infarction due to thrombosis of bilateral vertebral arteries: Secondary | ICD-10-CM

## 2019-06-20 DIAGNOSIS — I10 Essential (primary) hypertension: Secondary | ICD-10-CM | POA: Diagnosis present

## 2019-06-20 DIAGNOSIS — D72829 Elevated white blood cell count, unspecified: Secondary | ICD-10-CM | POA: Diagnosis present

## 2019-06-20 DIAGNOSIS — I639 Cerebral infarction, unspecified: Secondary | ICD-10-CM

## 2019-06-20 HISTORY — PX: RADIOLOGY WITH ANESTHESIA: SHX6223

## 2019-06-20 HISTORY — PX: IR US GUIDE VASC ACCESS RIGHT: IMG2390

## 2019-06-20 HISTORY — DX: Cerebral infarction, unspecified: I63.9

## 2019-06-20 HISTORY — PX: IR PERCUTANEOUS ART THROMBECTOMY/INFUSION INTRACRANIAL INC DIAG ANGIO: IMG6087

## 2019-06-20 HISTORY — PX: IR CT HEAD LTD: IMG2386

## 2019-06-20 LAB — CBC
HCT: 45.9 % (ref 39.0–52.0)
Hemoglobin: 14.6 g/dL (ref 13.0–17.0)
MCH: 26.4 pg (ref 26.0–34.0)
MCHC: 31.8 g/dL (ref 30.0–36.0)
MCV: 82.9 fL (ref 80.0–100.0)
Platelets: 259 10*3/uL (ref 150–400)
RBC: 5.54 MIL/uL (ref 4.22–5.81)
RDW: 16.3 % — ABNORMAL HIGH (ref 11.5–15.5)
WBC: 18.9 10*3/uL — ABNORMAL HIGH (ref 4.0–10.5)
nRBC: 0 % (ref 0.0–0.2)

## 2019-06-20 LAB — I-STAT CHEM 8, ED
BUN: 36 mg/dL — ABNORMAL HIGH (ref 8–23)
Calcium, Ion: 1.02 mmol/L — ABNORMAL LOW (ref 1.15–1.40)
Chloride: 98 mmol/L (ref 98–111)
Creatinine, Ser: 1.4 mg/dL — ABNORMAL HIGH (ref 0.61–1.24)
Glucose, Bld: 105 mg/dL — ABNORMAL HIGH (ref 70–99)
HCT: 47 % (ref 39.0–52.0)
Hemoglobin: 16 g/dL (ref 13.0–17.0)
Potassium: 3.9 mmol/L (ref 3.5–5.1)
Sodium: 133 mmol/L — ABNORMAL LOW (ref 135–145)
TCO2: 30 mmol/L (ref 22–32)

## 2019-06-20 LAB — DIFFERENTIAL
Abs Immature Granulocytes: 0.23 10*3/uL — ABNORMAL HIGH (ref 0.00–0.07)
Basophils Absolute: 0 10*3/uL (ref 0.0–0.1)
Basophils Relative: 0 %
Eosinophils Absolute: 0.2 10*3/uL (ref 0.0–0.5)
Eosinophils Relative: 1 %
Immature Granulocytes: 1 %
Lymphocytes Relative: 16 %
Lymphs Abs: 2.9 10*3/uL (ref 0.7–4.0)
Monocytes Absolute: 1.4 10*3/uL — ABNORMAL HIGH (ref 0.1–1.0)
Monocytes Relative: 8 %
Neutro Abs: 14.1 10*3/uL — ABNORMAL HIGH (ref 1.7–7.7)
Neutrophils Relative %: 74 %

## 2019-06-20 LAB — COMPREHENSIVE METABOLIC PANEL
ALT: 67 U/L — ABNORMAL HIGH (ref 0–44)
AST: 29 U/L (ref 15–41)
Albumin: 3.5 g/dL (ref 3.5–5.0)
Alkaline Phosphatase: 57 U/L (ref 38–126)
Anion gap: 11 (ref 5–15)
BUN: 34 mg/dL — ABNORMAL HIGH (ref 8–23)
CO2: 27 mmol/L (ref 22–32)
Calcium: 9 mg/dL (ref 8.9–10.3)
Chloride: 95 mmol/L — ABNORMAL LOW (ref 98–111)
Creatinine, Ser: 1.21 mg/dL (ref 0.61–1.24)
GFR calc Af Amer: >60 mL/min (ref >60)
GFR calc non Af Amer: >60 mL/min (ref >60)
Glucose, Bld: 108 mg/dL — ABNORMAL HIGH (ref 70–99)
Potassium: 4.1 mmol/L (ref 3.5–5.1)
Sodium: 133 mmol/L — ABNORMAL LOW (ref 135–145)
Total Bilirubin: 1 mg/dL (ref 0.3–1.2)
Total Protein: 6.6 g/dL (ref 6.5–8.1)

## 2019-06-20 LAB — SARS CORONAVIRUS 2 BY RT PCR (HOSPITAL ORDER, PERFORMED IN ~~LOC~~ HOSPITAL LAB): SARS Coronavirus 2: NEGATIVE

## 2019-06-20 LAB — APTT: aPTT: 27 seconds (ref 24–36)

## 2019-06-20 LAB — PROTIME-INR
INR: 1 (ref 0.8–1.2)
Prothrombin Time: 13.2 seconds (ref 11.4–15.2)

## 2019-06-20 SURGERY — IR WITH ANESTHESIA
Anesthesia: General

## 2019-06-20 MED ORDER — LISINOPRIL 20 MG PO TABS: 20.0000 mg | ORAL_TABLET | Freq: Every day | ORAL | Status: DC

## 2019-06-20 MED ORDER — CLEVIDIPINE BUTYRATE 0.5 MG/ML IV EMUL
0.0000 mg/h | INTRAVENOUS | Status: DC
  Administered 2019-06-20: 8 mg/h via INTRAVENOUS
  Filled 2019-06-20: qty 50

## 2019-06-20 MED ORDER — HYDRALAZINE HCL 20 MG/ML IJ SOLN
INTRAMUSCULAR | Status: DC | PRN
  Administered 2019-06-20: 10 mg via INTRAVENOUS

## 2019-06-20 MED ORDER — PHENYLEPHRINE HCL-NACL 10-0.9 MG/250ML-% IV SOLN
INTRAVENOUS | Status: DC | PRN
  Administered 2019-06-20: 50 ug/min via INTRAVENOUS

## 2019-06-20 MED ORDER — IOHEXOL 350 MG/ML SOLN
75.0000 mL | Freq: Once | INTRAVENOUS | Status: AC | PRN
  Administered 2019-06-20: 75 mL via INTRAVENOUS

## 2019-06-20 MED ORDER — IOHEXOL 300 MG/ML  SOLN
150.0000 mL | Freq: Once | INTRAMUSCULAR | Status: AC | PRN
  Administered 2019-06-20: 50 mL via INTRA_ARTERIAL

## 2019-06-20 MED ORDER — STROKE: EARLY STAGES OF RECOVERY BOOK
Freq: Once | Status: AC
  Filled 2019-06-20 (×2): qty 1

## 2019-06-20 MED ORDER — HYDROCHLOROTHIAZIDE 12.5 MG PO CAPS: 12.5000 mg | ORAL_CAPSULE | Freq: Every day | ORAL | Status: DC

## 2019-06-20 MED ORDER — ACETAMINOPHEN 160 MG/5ML PO SOLN: 650.0000 mg | ORAL | Status: DC | PRN

## 2019-06-20 MED ORDER — SUCCINYLCHOLINE CHLORIDE 20 MG/ML IJ SOLN
INTRAMUSCULAR | Status: DC | PRN
Start: 2019-06-20 — End: 2019-06-20
  Administered 2019-06-20: 100 mg via INTRAVENOUS

## 2019-06-20 MED ORDER — SODIUM CHLORIDE 0.9% FLUSH: 3.0000 mL | Freq: Once | INTRAVENOUS | Status: DC

## 2019-06-20 MED ORDER — ACETAMINOPHEN 650 MG RE SUPP: 650.0000 mg | RECTAL | Status: DC | PRN

## 2019-06-20 MED ORDER — CARVEDILOL 12.5 MG PO TABS: 12.5000 mg | ORAL_TABLET | Freq: Two times a day (BID) | ORAL | Status: DC

## 2019-06-20 MED ORDER — SUGAMMADEX SODIUM 200 MG/2ML IV SOLN
INTRAVENOUS | Status: DC | PRN
  Administered 2019-06-20: 200 mg via INTRAVENOUS

## 2019-06-20 MED ORDER — IOHEXOL 300 MG/ML  SOLN
50.0000 mL | Freq: Once | INTRAMUSCULAR | Status: AC | PRN
  Administered 2019-06-20: 10 mL via INTRA_ARTERIAL

## 2019-06-20 MED ORDER — SODIUM CHLORIDE 0.9 % IV SOLN: INTRAVENOUS | Status: DC | PRN

## 2019-06-20 MED ORDER — SODIUM CHLORIDE 0.9 % IV SOLN: INTRAVENOUS | Status: DC

## 2019-06-20 MED ORDER — ROCURONIUM BROMIDE 10 MG/ML (PF) SYRINGE
PREFILLED_SYRINGE | INTRAVENOUS | Status: DC | PRN
  Administered 2019-06-20: 50 mg via INTRAVENOUS

## 2019-06-20 MED ORDER — ONDANSETRON HCL 4 MG/2ML IJ SOLN
INTRAMUSCULAR | Status: DC | PRN
  Administered 2019-06-20: 4 mg via INTRAVENOUS

## 2019-06-20 MED ORDER — ACETAMINOPHEN 325 MG PO TABS
650.0000 mg | ORAL_TABLET | ORAL | Status: DC | PRN
  Administered 2019-06-23: 650 mg via ORAL
  Filled 2019-06-20: qty 2

## 2019-06-20 MED ORDER — EPHEDRINE SULFATE 50 MG/ML IJ SOLN
INTRAMUSCULAR | Status: DC | PRN
  Administered 2019-06-20: 5 mg via INTRAVENOUS
  Administered 2019-06-20: 10 mg via INTRAVENOUS

## 2019-06-20 MED ORDER — AMLODIPINE BESYLATE 5 MG PO TABS: 5.0000 mg | ORAL_TABLET | Freq: Every day | ORAL | Status: DC

## 2019-06-20 MED ORDER — LIDOCAINE 2% (20 MG/ML) 5 ML SYRINGE
INTRAMUSCULAR | Status: DC | PRN
  Administered 2019-06-20: 80 mg via INTRAVENOUS

## 2019-06-20 MED ORDER — PROPOFOL 10 MG/ML IV BOLUS
INTRAVENOUS | Status: DC | PRN
  Administered 2019-06-20: 30 mg via INTRAVENOUS
  Administered 2019-06-20: 50 mg via INTRAVENOUS
  Administered 2019-06-20: 120 mg via INTRAVENOUS

## 2019-06-20 MED ORDER — CLEVIDIPINE BUTYRATE 0.5 MG/ML IV EMUL
INTRAVENOUS | Status: AC
  Filled 2019-06-20: qty 50

## 2019-06-20 MED ORDER — CLEVIDIPINE BUTYRATE 0.5 MG/ML IV EMUL
INTRAVENOUS | Status: DC | PRN
  Administered 2019-06-20: 4 mg/h via INTRAVENOUS

## 2019-06-20 MED ORDER — SENNOSIDES-DOCUSATE SODIUM 8.6-50 MG PO TABS
1.0000 | ORAL_TABLET | Freq: Every evening | ORAL | Status: DC | PRN
  Administered 2019-06-25: 1 via ORAL
  Filled 2019-06-20: qty 1

## 2019-06-20 NOTE — Transfer of Care (Signed)
Immediate Anesthesia Transfer of Care Note  Patient: Agapito Games  Procedure(s) Performed: IR WITH ANESTHESIA (N/A )  Patient Location: ICU  Anesthesia Type:General  Level of Consciousness: awake and responds to stimulation  Airway & Oxygen Therapy: Patient Spontanous Breathing and Patient connected to face mask oxygen  Post-op Assessment: Report given to RN, Post -op Vital signs reviewed and stable and Patient moving all extremities X 4  Post vital signs: Reviewed and stable  Last Vitals:  Vitals Value Taken Time  BP    Temp    Pulse 75 06/20/19 2152  Resp 22 06/20/19 2152  SpO2 100 % 06/20/19 2152  Vitals shown include unvalidated device data.  Last Pain:  Vitals:   06/20/19 1930  TempSrc: Oral         Complications: No apparent anesthesia complications

## 2019-06-20 NOTE — H&P (Signed)
Neurology History and Physical    Chief Complaint: Acute onset of right hemiplegia and aphasia.   HPI: Blake Carey is an 64 y.o. male with a history of HTN and a-fib, on Eliquis, who presents via EMS as a Code Stroke after acute onset of right hemiplegia and aphasia while eating at a restaurant this evening. Time of onset is the same as LKN: 1845. He was witnessed by family to become non-verbal and not acting right, as well as unable to follow instructions.   He has no prior history of stroke.   LSN: [id number] tPA Given: No: On Eliquis NIHSS: 20  CT head: Normal for age non contrast CT appearance of the brain. ASPECTS 10.  CTA head and neck: 1. Positive for Left MCA M1 large vessel occlusion. 2. 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 3. Additionally, the study is positive for: - Severe stenosis of the Dominant Left Vertebral Artery V4 segment, - Moderate distal Basilar Artery stenosis, - severe Right PCA P2 stenosis, - mild to moderate Right MCA branch irregularity.  Past Medical History:  Diagnosis Date  . Atrial fibrillation (HCC)   . HTN (hypertension)     Past Surgical History:  Procedure Laterality Date  . APPENDECTOMY    . KNEE SURGERY Right     Family History  Problem Relation Age of Onset  . Prostate cancer Father    Social History:  reports that he has never smoked. He has never used smokeless tobacco. He reports that he does not drink alcohol or use drugs.  Allergies: No Known Allergies  Home Medications:  No current facility-administered medications on file prior to encounter.   Current Outpatient Medications on File Prior to Encounter  Medication Sig Dispense Refill  . amLODipine (NORVASC) 5 MG tablet Take 1 tablet (5 mg total) by mouth daily. 90 tablet 3  . apixaban (ELIQUIS) 5 MG TABS tablet Take 1 tablet (5 mg total) by mouth 2 (two) times daily. 180 tablet 1  . benzonatate (TESSALON) 100 MG capsule Take 100 mg  by mouth 3 (three) times daily as needed.    . carvedilol (COREG) 12.5 MG tablet Take 1 tablet (12.5 mg total) by mouth 2 (two) times daily. 180 tablet 2  . hydrochlorothiazide (MICROZIDE) 12.5 MG capsule Take 1 capsule (12.5 mg total) by mouth daily. 90 capsule 3  . lisinopril (ZESTRIL) 20 MG tablet Take 20 mg by mouth daily.    Marland Kitchen PREDNISONE PO Take by mouth as directed.       ROS: Unable to obtain due to aphasia.   Physical Examination: There were no vitals taken for this visit.  HEENT: [place]/AT Lungs: Respirations unlabored Ext: No edema  Neurologic Examination: Assistance from medical translator required (Spanish). Mental Status: Awake and alert. Dense receptive and expressive aphasia. Unable to follow commands. Mild right hemineglect.  Cranial Nerves: II:  No blink to threat on the right. PERRL.  III,IV, VI: No ptosis. Leftward gaze preference, but at times will gaze to the right past the midline in response to verbal stimuli coming from that direction. No nystagmus.   V,VII: No definite facial droop, but patient will not grimace to command or noxious. Reacts to tactile stimuli bilaterally.  VIII: Cannot formally assess hearing.  IX,X: Does not open mouth to command.  XI: Head preferentially rotated slightly to the left.  XII: Does not protrude tongue to command.  Motor: Moves LUE and LLE normally both spontaneously and to noxious stimuli.  RUE 0/5 RLE 3/5 Bulk is normal x 4.  Sensory: Decreased reactivity to noxious stimuli applied to RUE and RLE.  Deep Tendon Reflexes:  2+ bilateral brachioradialis and patellae Plantars: Right: equivocal   Left: downgoing Cerebellar: No gross ataxia on the left. Unable to assess on the right Gait: Unable to assess   Results for orders placed or performed during the hospital encounter of 06/20/19 (from the past 48 hour(s))  I-stat chem 8, ed     Status: Abnormal   Collection Time: 06/20/19  7:30 PM  Result Value Ref Range   Sodium 133  (L) 135 - 145 mmol/L   Potassium 3.9 3.5 - 5.1 mmol/L   Chloride 98 98 - 111 mmol/L   BUN 36 (H) 8 - 23 mg/dL   Creatinine, Ser 1.40 (H) 0.61 - 1.24 mg/dL   Glucose, Bld 105 (H) 70 - 99 mg/dL    Comment: Glucose reference range applies only to samples taken after fasting for at least 8 hours.   Calcium, Ion 1.02 (L) 1.15 - 1.40 mmol/L   TCO2 30 22 - 32 mmol/L   Hemoglobin 16.0 13.0 - 17.0 g/dL   HCT 47.0 39.0 - 52.0 %   No results found.  Assessment: 64 y.o. male with a PMHx of HTN and a-fib, on Eliquis, who presents via EMS as a Code Stroke after acute onset of right hemiplegia and aphasia while eating at a restaurant this evening. Time of onset is the same as LKN: 2671. He was witnessed by family to become non-verbal and not acting right, as well as unable to follow instructions. He has no prior history of stroke.  1. CTA of head and neck is positive for left MCA M1 segment occlusion. Additionally, the study is positive for severe stenosis of the Dominant Left Vertebral Artery V4 segment, Moderate distal Basilar Artery stenosis, severe Right PCA P2 stenosis and mild to moderate Right MCA branch irregularity. 2. CTA also reveals a 5 mm Saccular Aneurysm of the Anterior.  Communicating Artery. 3. NIHSS of 20 4. Stroke Risk Factors - atrial fibrillation and HTN 5. The patient is a candidate for endovascular thrombectomy. Case discussed with Dr. Earleen Newport. Daughter has provided informed consent for thrombectomy after extensive discussion of risks/benefits. The patient is unable to provide his own consent due to aphasia.   Plan: 1. Admitting to Neuro ICU under the Neurology service following VIR.  2. Post-VIR order set to include frequent neuro checks and BP management.  3. No antiplatelet medications or anticoagulants for at least 24 hours following tPA.  4. DVT prophylaxis with SCDs.  5. Will need to be started on a statin.  6. May need to be switched to an alternate anticoagulant (Coumadin  or Pradaxa) if follow up CT at 24 hours is negative for hemorrhagic conversion, as he has failed therapy with Eliquis. 7. MRI brain  8. TEE to assess for possible mural thrombus (has had a recent TTE).  9. Cardiac telemetry 10. PT/OT/Speech.  11. NPO until passes swallow evaluation.  12. Fasting lipid panel, HgbA1c 13. Consult Dr. Debbrah Alar on Monday to assess for possible coiling of Acomm aneurysm.   A total of 50 minutes was spent in the emergent neurological evaluation and management of this critically ill patient.   @Electronically  signed: Dr. Kerney Elbe  06/20/2019, 7:44 PM

## 2019-06-20 NOTE — Sedation Documentation (Signed)
Gold colored wedding band removed and given to pts daughter, Tobi Bastos.

## 2019-06-20 NOTE — Anesthesia Postprocedure Evaluation (Signed)
Anesthesia Post Note  Patient: Blake Carey  Procedure(s) Performed: IR WITH ANESTHESIA (N/A )     Patient location during evaluation: SICU Anesthesia Type: General Level of consciousness: awake and lethargic Pain management: pain level controlled Vital Signs Assessment: post-procedure vital signs reviewed and stable Respiratory status: spontaneous breathing, nonlabored ventilation, respiratory function stable and patient connected to face mask oxygen Cardiovascular status: Requiring cleviprex gtt for BP goals per IR. Postop Assessment: no headache Anesthetic complications: no Comments: S/p Code Stroke for Left MCA stroke. Presented left side down, aphasic.  Minimally interactive following extubation and transport to ICU.    Last Vitals:  Vitals:   06/20/19 2155 06/20/19 2200  BP:  (!) 150/86  Pulse:  (!) 111  Resp:  (!) 21  Temp: (P) 36.6 C   SpO2:  100%    Last Pain:  Vitals:   06/20/19 2150  TempSrc: Oral                 Cecile Hearing

## 2019-06-20 NOTE — Anesthesia Procedure Notes (Addendum)
Procedure Name: Intubation Date/Time: 06/20/2019 8:22 PM Performed by: Edmonia Caprio, CRNA Pre-anesthesia Checklist: Patient identified, Emergency Drugs available, Suction available, Patient being monitored and Timeout performed Patient Re-evaluated:Patient Re-evaluated prior to induction Oxygen Delivery Method: Ambu bag Preoxygenation: Pre-oxygenation with 100% oxygen Induction Type: IV induction Laryngoscope Size: Glidescope and 3 Grade View: Grade I Tube type: Oral Tube size: 7.5 mm Number of attempts: 1 Airway Equipment and Method: Stylet and Video-laryngoscopy Placement Confirmation: ETT inserted through vocal cords under direct vision,  breath sounds checked- equal and bilateral,  CO2 detector and positive ETCO2 Secured at: 23 cm Tube secured with: Tape Dental Injury: Teeth and Oropharynx as per pre-operative assessment

## 2019-06-20 NOTE — Progress Notes (Signed)
NeuroInterventional Radiology  Pre-Procedure Note  History: 64 yo male presents as code stroke to [facility].   LSN was 6:45pm, when family noticed patient had acute symptoms of dysarthria.   Known history of afib, taking eliquis.   Baseline mRS: 0 NIHSS:   19  CT ASPECTS: 10 CTA:   Left M1 occlusion  Discussed with Dr. Otelia Limes of Stroke Neurology,  Given the patient's symptoms, imaging findings, baseline function, I agree they are an appropriate candidate for attempt at mechanical thrombectomy.    The risks and benefits of the procedure were discussed with the patient/patient's family by way of 3-person phone call, with specific risks including: bleeding, infection, arterial injury/dissection, contrast reaction, kidney injury, need for further procedure/surgery, neurologic deficit, 10-15% risk of intracranial hemorrhage, cardiopulmonary collapse, death. All questions were answered.  The patient/family would like to proceed with attempt at thrombectomy.    Plan for cerebral angiogram and attempt at mechanical thrombectomy.   Signed,   Yvone Neu. Loreta Ave, DO

## 2019-06-20 NOTE — Sedation Documentation (Signed)
Groin and pulses assess on arrival to 4N22. No change, see flowsheet.

## 2019-06-20 NOTE — Sedation Documentation (Signed)
Pt extubated

## 2019-06-20 NOTE — Anesthesia Procedure Notes (Signed)
Arterial Line Insertion Start/End5/22/2021 8:35 PM Performed by: Edmonia Caprio, CRNA  Preanesthetic checklist: patient identified, IV checked and monitors and equipment checked Emergency situation radial was placed Catheter size: 20 G Hand hygiene performed  and Seldinger technique used  Attempts: 1 Procedure performed without using ultrasound guided technique. Following insertion, dressing applied and Biopatch. Patient tolerated the procedure well with no immediate complications.

## 2019-06-20 NOTE — Anesthesia Preprocedure Evaluation (Signed)
Anesthesia Evaluation  Patient identified by MRN, date of birth, ID band Patient unresponsive    Reviewed: Allergy & Precautions, NPO status , Patient's Chart, lab work & pertinent test results, Unable to perform ROS - Chart review onlyPreop documentation limited or incomplete due to emergent nature of procedure.  History of Anesthesia Complications Negative for: history of anesthetic complications  Airway Mallampati: II  TM Distance: >3 FB Neck ROM: Full    Dental  (+) Teeth Intact, Dental Advisory Given, Poor Dentition   Pulmonary Recent URI ,    Pulmonary exam normal breath sounds clear to auscultation       Cardiovascular hypertension, Pt. on home beta blockers and Pt. on medications + dysrhythmias Atrial Fibrillation  Rhythm:Irregular Rate:Abnormal     Neuro/Psych Left MCA stroke negative psych ROS   GI/Hepatic negative GI ROS, Neg liver ROS,   Endo/Other  negative endocrine ROS  Renal/GU negative Renal ROS     Musculoskeletal   Abdominal   Peds  Hematology  (+) Blood dyscrasia (Eliquis), ,   Anesthesia Other Findings   Reproductive/Obstetrics                             Anesthesia Physical Anesthesia Plan  ASA: IV and emergent  Anesthesia Plan: General   Post-op Pain Management:    Induction: Intravenous and Rapid sequence  PONV Risk Score and Plan: 2 and Treatment may vary due to age or medical condition  Airway Management Planned: Oral ETT and Video Laryngoscope Planned  Additional Equipment: Arterial line  Intra-op Plan:   Post-operative Plan: Possible Post-op intubation/ventilation  Informed Consent: I have reviewed the patients History and Physical, chart, labs and discussed the procedure including the risks, benefits and alternatives for the proposed anesthesia with the patient or authorized representative who has indicated his/her understanding and acceptance.      Only emergency history available, History available from chart only and Consent reviewed with POA  Plan Discussed with:   Anesthesia Plan Comments: (Pre-op evaluation completed in Epic after induction of anesthesia due to emergent nature of procedure.  Patient's daughter at bedside and answered brief H&P questions prior to proceeding with induction of anesthesia. )        Anesthesia Quick Evaluation

## 2019-06-20 NOTE — ED Triage Notes (Addendum)
Pt was eating supper when @ approximately 18:30ish when family noticed at 18:45 that patient became non-verbal/not acting right/ unable to follow commands. Pt has hx of Afib and takes Eliquis. Pt is non-English speaking.

## 2019-06-20 NOTE — Procedures (Signed)
Neuro-Interventional Radiology  Post Cerebral Angiogram Procedure Note  History:   64 yo male with acute left M1 occlusion/ELVO  Baseline mRS:  0 NIHSS:   19 Last Known Well:  18:45 ASPECTS:   10 Site of occlusion:  Left M1   Skin Puncture:   20:37 First Pass Date & Time: 20:51 IV tPA administered?: no IA Medication:  no  Final mTICI Score & time: TICI 3 & 20:51  First Pass Device:   ADAPT strategy, with 55 zoom catheter Anesthesia    GETA  Procedure:  - US guided R CFA access - Cerebral Angiogram - Mechanical thrombectomy of ELVO involving left m1 occlusion - Deployment of Angioseal for hemostasis - Flat panel CT in NIR suite  Findings:  TICI 0 through the affected branch to start TICI 3 at completion after 1 pass aspiration Flat panel CT shows no hemorrhage  Complications: None  EBL: 50cc  Recommendations: - right hip straight overnight - Goal SBP 120-140.   - Frequent NV checks - Repeat CT or MRI imaging recommended within 36 hours, discretion of Neurology - NIR to follow  Signed,  Yvone Neu. Loreta Ave, DO

## 2019-06-21 ENCOUNTER — Inpatient Hospital Stay (HOSPITAL_COMMUNITY): Payer: Self-pay

## 2019-06-21 DIAGNOSIS — I63512 Cerebral infarction due to unspecified occlusion or stenosis of left middle cerebral artery: Secondary | ICD-10-CM

## 2019-06-21 LAB — BASIC METABOLIC PANEL
Anion gap: 4 — ABNORMAL LOW (ref 5–15)
BUN: 23 mg/dL (ref 8–23)
CO2: 26 mmol/L (ref 22–32)
Calcium: 8.3 mg/dL — ABNORMAL LOW (ref 8.9–10.3)
Chloride: 108 mmol/L (ref 98–111)
Creatinine, Ser: 0.9 mg/dL (ref 0.61–1.24)
GFR calc Af Amer: >60 mL/min (ref >60)
GFR calc non Af Amer: >60 mL/min (ref >60)
Glucose, Bld: 95 mg/dL (ref 70–99)
Potassium: 4.1 mmol/L (ref 3.5–5.1)
Sodium: 138 mmol/L (ref 135–145)

## 2019-06-21 LAB — CBC
HCT: 41.1 % (ref 39.0–52.0)
Hemoglobin: 13.4 g/dL (ref 13.0–17.0)
MCH: 26.8 pg (ref 26.0–34.0)
MCHC: 32.6 g/dL (ref 30.0–36.0)
MCV: 82.2 fL (ref 80.0–100.0)
Platelets: 218 10*3/uL (ref 150–400)
RBC: 5 MIL/uL (ref 4.22–5.81)
RDW: 16.6 % — ABNORMAL HIGH (ref 11.5–15.5)
WBC: 14.5 10*3/uL — ABNORMAL HIGH (ref 4.0–10.5)
nRBC: 0 % (ref 0.0–0.2)

## 2019-06-21 LAB — URINALYSIS, ROUTINE W REFLEX MICROSCOPIC
Bilirubin Urine: NEGATIVE
Glucose, UA: NEGATIVE mg/dL
Hgb urine dipstick: NEGATIVE
Ketones, ur: NEGATIVE mg/dL
Leukocytes,Ua: NEGATIVE
Nitrite: NEGATIVE
Protein, ur: NEGATIVE mg/dL
Specific Gravity, Urine: 1.023 (ref 1.005–1.030)
pH: 6 (ref 5.0–8.0)

## 2019-06-21 LAB — RAPID URINE DRUG SCREEN, HOSP PERFORMED
Amphetamines: NOT DETECTED
Barbiturates: NOT DETECTED
Benzodiazepines: NOT DETECTED
Cocaine: NOT DETECTED
Opiates: NOT DETECTED
Tetrahydrocannabinol: NOT DETECTED

## 2019-06-21 LAB — LIPID PANEL
Cholesterol: 212 mg/dL — ABNORMAL HIGH (ref 0–200)
HDL: 50 mg/dL (ref >40)
LDL Cholesterol: 143 mg/dL — ABNORMAL HIGH (ref 0–99)
Total CHOL/HDL Ratio: 4.2 RATIO
Triglycerides: 94 mg/dL (ref <150)
VLDL: 19 mg/dL (ref 0–40)

## 2019-06-21 LAB — HEMOGLOBIN A1C
Hgb A1c MFr Bld: 6 % — ABNORMAL HIGH (ref 4.8–5.6)
Mean Plasma Glucose: 125.5 mg/dL

## 2019-06-21 LAB — GLUCOSE, CAPILLARY: Glucose-Capillary: 98 mg/dL (ref 70–99)

## 2019-06-21 LAB — APTT: aPTT: 69 seconds — ABNORMAL HIGH (ref 24–36)

## 2019-06-21 LAB — HEPARIN LEVEL (UNFRACTIONATED): Heparin Unfractionated: 0.4 IU/mL (ref 0.30–0.70)

## 2019-06-21 LAB — HIV ANTIBODY (ROUTINE TESTING W REFLEX): HIV Screen 4th Generation wRfx: NONREACTIVE

## 2019-06-21 MED ORDER — PANTOPRAZOLE SODIUM 40 MG PO TBEC
40.0000 mg | DELAYED_RELEASE_TABLET | Freq: Every day | ORAL | Status: DC
  Administered 2019-06-21 – 2019-06-26 (×6): 40 mg via ORAL
  Filled 2019-06-21 (×6): qty 1

## 2019-06-21 MED ORDER — ATORVASTATIN CALCIUM 40 MG PO TABS
40.0000 mg | ORAL_TABLET | Freq: Every day | ORAL | Status: DC
  Administered 2019-06-21 – 2019-06-26 (×6): 40 mg via ORAL
  Filled 2019-06-21 (×6): qty 1

## 2019-06-21 MED ORDER — SODIUM CHLORIDE 0.9 % IV SOLN: INTRAVENOUS | Status: DC

## 2019-06-21 MED ORDER — HYDRALAZINE HCL 20 MG/ML IJ SOLN
5.0000 mg | INTRAMUSCULAR | Status: DC | PRN
  Administered 2019-06-23: 5 mg via INTRAVENOUS
  Filled 2019-06-21: qty 1

## 2019-06-21 MED ORDER — WARFARIN SODIUM 7.5 MG PO TABS
7.5000 mg | ORAL_TABLET | Freq: Once | ORAL | Status: AC
  Administered 2019-06-21: 7.5 mg via ORAL
  Filled 2019-06-21: qty 1

## 2019-06-21 MED ORDER — INSULIN ASPART 100 UNIT/ML ~~LOC~~ SOLN
0.0000 [IU] | Freq: Three times a day (TID) | SUBCUTANEOUS | Status: DC
  Administered 2019-06-22: 1 [IU] via SUBCUTANEOUS
  Administered 2019-06-23: 2 [IU] via SUBCUTANEOUS
  Administered 2019-06-24 (×2): 1 [IU] via SUBCUTANEOUS

## 2019-06-21 MED ORDER — PANTOPRAZOLE SODIUM 40 MG IV SOLR: 40.0000 mg | INTRAVENOUS | Status: DC

## 2019-06-21 MED ORDER — SODIUM CHLORIDE 0.9 % IV BOLUS
1000.0000 mL | Freq: Once | INTRAVENOUS | Status: AC
  Administered 2019-06-21: 1000 mL via INTRAVENOUS

## 2019-06-21 MED ORDER — WARFARIN - PHARMACIST DOSING INPATIENT: Freq: Every day | Status: DC

## 2019-06-21 MED ORDER — HEPARIN (PORCINE) 25000 UT/250ML-% IV SOLN
1100.0000 [IU]/h | INTRAVENOUS | Status: DC
  Administered 2019-06-21: 900 [IU]/h via INTRAVENOUS
  Administered 2019-06-23 – 2019-06-25 (×3): 800 [IU]/h via INTRAVENOUS
  Filled 2019-06-21 (×4): qty 250

## 2019-06-21 MED ORDER — CHLORHEXIDINE GLUCONATE CLOTH 2 % EX PADS
6.0000 | MEDICATED_PAD | Freq: Every day | CUTANEOUS | Status: DC
  Administered 2019-06-22 – 2019-06-26 (×4): 6 via TOPICAL

## 2019-06-21 NOTE — Progress Notes (Signed)
ANTICOAGULATION CONSULT NOTE - Initial Consult  Pharmacy Consult for Heparin + Warfarin Indication: stroke  No Known Allergies  Patient Measurements:   Actual body weight: 76.2 kg Ideal body weight: 56.9 kg (125 lb 7.1 oz) Adjusted ideal body weight: 64.6 kg (142 lb 7.4 oz)   Heparin Dosing Weight: 72.5 kg  Vital Signs: Temp: 97.5 F (36.4 C) (05/23 1200) Temp Source: Oral (05/23 1200) BP: 127/88 (05/23 1100) Pulse Rate: 67 (05/23 1100)  Labs: Recent Labs    06/20/19 1930 06/20/19 1930 06/20/19 1935 06/21/19 1027  HGB 16.0   < > 14.6 13.4  HCT 47.0  --  45.9 41.1  PLT  --   --  259 218  APTT  --   --  27  --   LABPROT  --   --  13.2  --   INR  --   --  1.0  --   CREATININE 1.40*  --  1.21 0.90   < > = values in this interval not displayed.    Estimated Creatinine Clearance: 73.8 mL/min (by C-G formula based on SCr of 0.9 mg/dL).   Medical History: Past Medical History:  Diagnosis Date  . Atrial fibrillation (HCC)   . HTN (hypertension)     Assessment: 39 YOM who presented on 5/22 w/ R-hemiplegia and aphasia and found to have an acute stroke. The patient was on Apixaban PTA for hx Afib. Given the new stroke - pharmacy has been consulted to transition to warfarin with a heparin bridge.   The patient's last dose of Apixaban was noted to be 5/22 at 1800. Recent dosing will effect heparin levels so will utilize aPTT checks for heparin monitoring until correlated. No bolus and low goal given acute stroke. Baseline INR 1  Goal of Therapy:  Heparin level 0.3-0.5 units/ml aPTT 66-85 seconds Monitor platelets by anticoagulation protocol: Yes   Plan:  - Start Heparin at 900 units/hr (9 ml/hr) - Warfarin 7.5 mg x 1 - Daily aPTT/HL until correlated, daily PT/INR - Will continue to monitor for any signs/symptoms of bleeding and will follow up with heparin level in 6 hours and PT/INR in the AM  Thank you for allowing pharmacy to be a part of this patient's  care.  Georgina Pillion, PharmD, BCPS Clinical Pharmacist Clinical phone for 06/21/2019: [phone] 06/21/2019 12:50 PM   **Pharmacist phone directory can now be found on [email or web address] ([email or web address]).  Listed under [facility].

## 2019-06-21 NOTE — Evaluation (Signed)
Clinical/Bedside Swallow Evaluation Patient Details  Name: Blake Carey MRN: [id number] Date of Birth: 1955-03-27  Today's Date: 06/21/2019 Time: SLP Start Time (ACUTE ONLY): 1050 SLP Stop Time (ACUTE ONLY): 1108 SLP Time Calculation (min) (ACUTE ONLY): 18 min  Past Medical History:  Past Medical History:  Diagnosis Date  . Atrial fibrillation (HCC)   . HTN (hypertension)    Past Surgical History:  Past Surgical History:  Procedure Laterality Date  . APPENDECTOMY    . IR CT HEAD LTD  06/20/2019  . IR PERCUTANEOUS ART THROMBECTOMY/INFUSION INTRACRANIAL INC DIAG ANGIO  06/20/2019  . IR US GUIDE VASC ACCESS RIGHT  06/20/2019  . KNEE SURGERY Right    HPI:   Blake Carey is an 64 y.o. male who presents via EMS as a Code Stroke after acute onset of right hemiplegia and aphasia while eating at a restaurant.     Assessment / Plan / Recommendation Clinical Impression   Pt presents with unremarkable CN exam.  His oral phase is timely and efficient with no anterior spillage of boluses or residue remaining in the oral cavity post swallow.  Pt exhibits no overt s/s of aspiration with any consistencies assessed including thin liquids, purees, and solids.  As a result, will recommend that pt resume a regular diet and thin liquids at this time.  SLP will follow up 1-2 additional sessions to ensure adequate toleration of diet but do not anticipate ST needs for dysphagia beyond that.    SLP Visit Diagnosis: Dysphagia, unspecified (R13.10)    Aspiration Risk  Mild aspiration risk    Diet Recommendation Regular;Thin liquid   Liquid Administration via: Cup;Straw Medication Administration: Whole meds with liquid Supervision: Patient able to self feed;Intermittent supervision to cue for compensatory strategies Compensations: Minimize environmental distractions;Slow rate;Small sips/bites Postural Changes: Seated upright at 90 degrees;Remain upright for at least 30 minutes after po intake    Other   Recommendations Oral Care Recommendations: Oral care BID   Follow up Recommendations Other (comment)(To be determined)      Frequency and Duration min 2x/week          Prognosis Prognosis for Safe Diet Advancement: Good      Swallow Study   General HPI:  Blake Carey is an 64 y.o. male who presents via EMS as a Code Stroke after acute onset of right hemiplegia and aphasia while eating at [employer].   Type of Study: Bedside Swallow Evaluation Previous Swallow Assessment: none on record Diet Prior to this Study: NPO Temperature Spikes Noted: Yes Respiratory Status: Room air History of Recent Intubation: Yes Length of Intubations (days): 1 days(for procedure only) Date extubated: 06/20/19 Behavior/Cognition: Alert;Cooperative;Pleasant mood Oral Cavity Assessment: Within Functional Limits Oral Care Completed by SLP: Yes Oral Cavity - Dentition: Adequate natural dentition Vision: Functional for self-feeding Self-Feeding Abilities: Able to feed self Patient Positioning: Upright in bed Baseline Vocal Quality: Normal Volitional Cough: Strong    Oral/Motor/Sensory Function Overall Oral Motor/Sensory Function: Within functional limits   Ice Chips     Thin Liquid Thin Liquid: Within functional limits    Nectar Thick     Honey Thick     Puree Puree: Within functional limits   Solid     Solid: Within functional limits      Jackalyn Lombard L 06/21/2019,11:31 AM

## 2019-06-21 NOTE — Progress Notes (Signed)
STROKE TEAM PROGRESS NOTE   INTERVAL HISTORY His RN is at the bedside.  I later talked to patient daughter and the wife over the phone.  Patient awake alert, orientated.  Moving all extremities.  Is going to MRI and MRA.  As per family, patient has been on Eliquis for the last 5 months.  He has no insurance, he has been paying $500 per month for Eliquis.  Although family has difficulty to afford in the long run, however he was able to get medication every months and compliant with medication.  Last Eliquis dose was yesterday.  Daughter and the wife are interested in transition Eliquis to Coumadin for better affordability.  He follows with Dr. Servando Salina cardiology at [facility].   OBJECTIVE Vitals:   06/21/19 0700 06/21/19 0715 06/21/19 0800 06/21/19 0900  BP: 114/72  116/76 116/74  Pulse: (!) 48 (!) 55 (!) 58 63  Resp: 15 14 15 12   Temp:   97.7 F (36.5 C)   TempSrc:   Oral   SpO2: 99% 100% 99% 100%    CBC:  Recent Labs  Lab 06/20/19 1930 06/20/19 1935  WBC  --  18.9*  NEUTROABS  --  14.1*  HGB 16.0 14.6  HCT 47.0 45.9  MCV  --  82.9  PLT  --  259    Basic Metabolic Panel:  Recent Labs  Lab 06/20/19 1930 06/20/19 1935  NA 133* 133*  K 3.9 4.1  CL 98 95*  CO2  --  27  GLUCOSE 105* 108*  BUN 36* 34*  CREATININE 1.40* 1.21  CALCIUM  --  9.0    Lipid Panel:     Component Value Date/Time   CHOL 212 (H) 06/21/2019 0236   TRIG 94 06/21/2019 0236   HDL 50 06/21/2019 0236   CHOLHDL 4.2 06/21/2019 0236   VLDL 19 06/21/2019 0236   LDLCALC 143 (H) 06/21/2019 0236   HgbA1c:  Lab Results  Component Value Date   HGBA1C 6.0 (H) 06/21/2019   Urine Drug Screen:     Component Value Date/Time   LABOPIA NONE DETECTED 06/21/2019 0753   COCAINSCRNUR NONE DETECTED 06/21/2019 0753   LABBENZ NONE DETECTED 06/21/2019 0753   AMPHETMU NONE DETECTED 06/21/2019 0753   THCU NONE DETECTED 06/21/2019 0753   LABBARB NONE DETECTED 06/21/2019 0753    Alcohol Level No results found for:  ETH  IMAGING  CT ANGIO HEAD CODE STROKE CT ANGIO NECK W OR WO CONTRAST 06/20/2019 IMPRESSION:  1. Positive for Left MCA M1 large vessel occlusion.  2. 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  3. Additionally, the study is positive for: - Severe stenosis of the Dominant Left Vertebral Artery V4 segment, - Moderate distal Basilar Artery stenosis, - severe Right PCA P2 stenosis, - mild to moderate Right MCA branch irregularity.  CT HEAD CODE STROKE WO CONTRAST 06/20/2019 IMPRESSION:  Normal for age non contrast CT appearance of the brain. ASPECTS 10.   MRI / MRA Head 06/21/19 1. Few, tiny acute infarcts in the left MCA distribution, primarily at the basal ganglia. There is a punctate acute cortical infarct in the high right frontal lobe. 2. The treated left M1 segment remains patent with irregularity from atherosclerosis or treated thrombus. The upper division left M2 branch is severely stenotic with focal appearance likely reflecting atherosclerosis. 3. Extensive intracranial atherosclerosis with dominant narrowings noted above. 4. 5 mm anterior communicating artery aneurysm.  IR CT Head Ltd 06/20/2019 IMPRESSION:  Status  post ultrasound-guided access right common femoral artery for left-sided cervical and cerebral angiogram and treatment of left M1 occlusion with single pass of aspiration thrombectomy restoring complete flow through the M1. Angio-Seal for hemostasis. Signed, Yvone Neu. Reyne Dumas, RPVI [facility] Radiology  PLAN:  ICU status, [id number] Target systolic blood pressure of 120-140 Right hip straight time 6 hours Frequent neurovascular checks Repeat neurologic imaging with CT and/MRI at the discretion of neurology team   DG CHEST PORT 1 VIEW 06/21/2019 IMPRESSION:  1. No evident pneumonia or significant atelectasis.  2. Cardiomegaly   Transthoracic Echocardiogram  04/01/19 IMPRESSIONS   1. Left ventricular ejection fraction, by estimation, is 60 to 65%. The  left ventricle has normal function. The left ventricle has no regional  wall motion abnormalities. Left ventricular diastolic parameters are  indeterminate.  2. Right ventricular systolic function is normal. The right ventricular  size is normal. There is normal pulmonary artery systolic pressure.  3. Left atrial size was moderately dilated.  4. The mitral valve is normal in structure and function. Trivial mitral  valve regurgitation. No evidence of mitral stenosis.  5. The aortic valve is normal in structure and function. Aortic valve  regurgitation is mild. No aortic stenosis is present.  6. The inferior vena cava is normal in size with greater than 50%  respiratory variability, suggesting right atrial pressure of 3 mmHg.    PHYSICAL EXAM  Temp:  [97.5 F (36.4 C)-98.8 F (37.1 C)] 98.8 F (37.1 C) (05/23 1600) Pulse Rate:  [31-111] 36 (05/23 1600) Resp:  [9-22] 16 (05/23 1600) BP: (92-154)/(55-105) 122/76 (05/23 1600) SpO2:  [93 %-100 %] 99 % (05/23 1600) Arterial Line BP: (96-195)/(43-74) 139/60 (05/23 0900) FiO2 (%):  [28 %] 28 % (05/22 2055)  General - Well nourished, well developed, in no apparent distress.  Ophthalmologic - fundi not visualized due to noncooperation.  Cardiovascular - irregularly irregular heart rate and rhythm with bradycardia at 40s.  Mental Status -  Level of arousal and orientation to time, place, and person were intact. Language including expression, naming, repetition, comprehension was assessed and found intact.  Cranial Nerves II - XII - II - Visual field intact OU. III, IV, VI - Extraocular movements intact. V - Facial sensation intact bilaterally. VII - Facial movement intact bilaterally. VIII - Hearing & vestibular intact bilaterally. X - Palate elevates symmetrically. XI - Chin turning & shoulder shrug intact bilaterally. XII - Tongue protrusion  intact.  Motor Strength - The patient's strength was normal in all extremities and pronator drift was absent.  Bulk was normal and fasciculations were absent.   Motor Tone - Muscle tone was assessed at the neck and appendages and was normal.  Reflexes - The patient's reflexes were symmetrical in all extremities and he had no pathological reflexes.  Sensory - Light touch, temperature/pinprick were assessed and were symmetrical.    Coordination - The patient had normal movements in the hands and feet with no ataxia or dysmetria.  Tremor was absent.  Gait and Station - deferred.   ASSESSMENT/PLAN Mr. Edman Heitman is a 64 y.o. male with history of HTN and a-fib, on Eliquis, who presents via EMS as a Code Stroke after acute onset of right hemiplegia, aphasia and unable to follow instructions. while eating at a restaurant this evening. Time of onset is the same as LKN: 1845. He did not receive IV t-PA due to anticoagulation therapy. IR - left M1 occlusion with single pass of aspiration  thrombectomy restoring complete flow through the M1.  Stroke: Scattered punctate infarcts left MCA territory due to left M1 occlusion s/p IR with TICI3 reperfusion, embolic due to A. fib even on Eliquis. Large vessel disease also possible given CTA/MRA findings  CT Head - Normal for age non contrast CT appearance of the brain. ASPECTS 10.   CTA H&N - Positive for Left MCA M1 large vessel occlusion. Additionally, the study is positive for: - Severe stenosis of the Dominant Left Vertebral Artery V4 segment, - Moderate distal Basilar Artery stenosis, - severe Right PCA P2 stenosis, - mild to moderate Right MCA branch irregularity.  MRI head - Few, tiny acute infarcts in the left MCA distribution, primarily at the basal ganglia. There is a punctate acute cortical infarct in the high right frontal lobe.  MRA head - treated left M1 segment remains patent with irregularity from atherosclerosis or treated thrombus. The  upper division left M2 branch is severely stenotic with focal appearance likely reflecting atherosclerosis. high-grade right supraclinoid ICA stenosis. tandem high-grade P2 and P3 segment narrowings  2D Echo - 04/01/19 - EF 60 - 65%. No cardiac source of emboli identified.   Sars Corona Virus 2 - negative  LDL - 143  HgbA1c - 6.0  UDS - negative  VTE prophylaxis - heparin IV  Eliquis (apixaban) daily prior to admission, now on heparin IV. Plan for coumadin with INR goal 2-3 (family not able to afford $500 / month eliquis anymore). Not on coumadin yet due to potential coiling with ACOM aneurysm this admission  Patient will be counseled to be compliant with his antithrombotic medications  Ongoing aggressive stroke risk factor management  Therapy recommendations: outpt PT/OT  Disposition:  Pending  ACOM aneurysm  CTA head and neck positive also for 5 mm Saccular Aneurysm of the ACOM.  MRA head confirmed the finding  Please contact Dr. Norma Fredrickson in a.m. for further management (Dr. Earleen Newport has informed Dr. Norma Fredrickson)  On heparin IV  Not put on coumadin yet, in case pt needs coiling this admission  Afib with bradycardia  HR 5s  Follows with Dr. Sherren Mocha Cardiology in [place]  On Eliquis PTA  Now on heparin IV  Due to cost, will switch to Coumadin with INR 2-3 for long term AC  Close monitor heart rate  Did not resume home Coreg  Hypertension  Home BP meds: amlodipine ; HCTZ ; Zestril ; Coreg  Off cleviprex  Stable . BP goal 120-140 within 24 hours of IR procedure . Long-term BP goal normotensive  Hyperlipidemia  Home Lipid lowering medication: none  LDL 143, goal < 70  Current lipid lowering medication: Lipitor 40  Continue statin at discharge  Other Stroke Risk Factors  Advanced age  Obesity, recommend weight loss, diet and exercise as appropriate   Other Active Problems  Code status - Full code  Leukocytosis - 18.9->14.5 (afebrile)   AKI -  creatinine - 1.40->1.21->0.9  Hospital day # 1  This patient is critically ill due to left MCA stroke s/p thrombectomy, afib with bradycardia, HTN, HLD and at significant risk of neurological worsening, death form recurrent stroke, hemorrhagic conversion, heart failure, cardiac arrest, seizure. This patient's care requires constant monitoring of vital signs, hemodynamics, respiratory and cardiac monitoring, review of multiple databases, neurological assessment, discussion with family, other specialists and medical decision making of high complexity. I spent 40 minutes of neurocritical care time in the care of this patient. I had long discussion with daughter and the wife over the  phone, updated pt current condition, treatment plan and potential prognosis, and answered all the questions.  They expressed understanding and appreciation.   Marvel Plan, MD PhD Stroke Neurology 06/21/2019 5:34 PM   To contact Stroke Continuity provider, please refer to [email or web address]. After hours, contact General Neurology

## 2019-06-21 NOTE — Evaluation (Signed)
Occupational Therapy Evaluation Patient Details Name: Blake Carey MRN: [id number] DOB: 08/01/1955 Today's Date: 06/21/2019    History of Present Illness 64 y.o male admitted for stroke work up after R hemiplegia and aphasia. Imaging positive for Left MCA M1 large vessel occlusion. 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 At time of eval, pt able to complete bed mobility and sit <> stands at supervision level. He then completed beyond household level of functional mobility with supervision assist due to higher level dynamic balance deficits. Noted decreased fine motor control, coordination, and proprioception with RUE. Pt cognition appears to be somewhat below baseline. Noted delayed responses and poor awareness of deficits. Given current status, recommend OP OT for continued progression of BADL and RUE function to promote independent baseline. Will continue to follow per POC Listed below.    Follow Up Recommendations  Outpatient OT    Equipment Recommendations  None recommended by OT    Recommendations for Other Services       Precautions / Restrictions Precautions Precautions: Fall Restrictions Weight Bearing Restrictions: No      Mobility Bed Mobility Overal bed mobility: Needs Assistance Bed Mobility: Supine to Sit     Supine to sit: Supervision        Transfers Overall transfer level: Needs assistance Equipment used: None Transfers: Sit to/from Stand Sit to Stand: Supervision              Balance Overall balance assessment: Needs assistance Sitting-balance support: No upper extremity supported;Feet supported Sitting balance-Leahy Scale: Good Sitting balance - Comments: supervision in attempts to don socks   Standing balance support: No upper extremity supported;During functional activity Standing balance-Leahy Scale: Good Standing balance comment: supervision to wash hands                            ADL either performed or assessed with clinical judgement   ADL Overall ADL's : Needs assistance/impaired Eating/Feeding: Set up;Sitting   Grooming: Supervision/safety;Standing   Upper Body Bathing: Supervision/ safety;Sitting   Lower Body Bathing: Supervison/ safety;Sit to/from stand   Upper Body Dressing : Supervision/safety;Sitting   Lower Body Dressing: Minimal assistance;Sit to/from stand Lower Body Dressing Details (indicate cue type and reason): min A to don socks, unable to maintain strong grasp with RUE                     Vision Patient Visual Report: No change from baseline Additional Comments: difficult getting full assessment 2/2 language barrier. Pt with head turns to find objects in right field of vision     Perception     Praxis      Pertinent Vitals/Pain Pain Assessment: No/denies pain     Hand Dominance     Extremity/Trunk Assessment Upper Extremity Assessment Upper Extremity Assessment: RUE deficits/detail RUE Deficits / Details: decreased proprioception; decr R grip strength RUE Coordination: decreased fine motor;decreased gross motor   Lower Extremity Assessment Lower Extremity Assessment: Defer to PT evaluation   Cervical / Trunk Assessment Cervical / Trunk Assessment: Normal   Communication Communication Communication: No difficulties   Cognition Arousal/Alertness: Awake/alert Behavior During Therapy: WFL for tasks assessed/performed Overall Cognitive Status: Impaired/Different from baseline Area of Impairment: Safety/judgement;Problem solving                       Following Commands: Follows multi-step commands with increased time  Safety/Judgement: Decreased awareness of deficits Awareness: Intellectual Problem Solving: Slow processing;Decreased initiation;Requires verbal cues General Comments: slow to process, keeping eyes closed often. 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   General Comments  VSS on RA, pt's daughter interpreting as needed during session    Exercises     Shoulder Instructions      Home Living Family/patient expects to be discharged to:: Private residence Living Arrangements: Spouse/significant other Available Help at Discharge: Family;Available 24 hours/day Type of Home: House Home Access: Stairs to enter [employer] of Steps: 3 Entrance Stairs-Rails: Can reach both Home Layout: One level     Bathroom Shower/Tub: Teacher, early years/pre: Standard     Home Equipment: None      Lives With: Family    Prior Functioning/Environment Level of Independence: Independent        Comments: electrician, independent at baseline        OT Problem List: Decreased strength;Impaired vision/perception;Decreased knowledge of use of DME or AE;Decreased coordination;Decreased activity tolerance;Impaired UE functional use;Impaired balance (sitting and/or standing);Decreased safety awareness      OT Treatment/Interventions: Self-care/ADL training;Therapeutic exercise;Patient/family education;Balance training;Neuromuscular education;Therapeutic activities;Energy conservation;DME and/or AE instruction    OT Goals(Current goals can be found in the care plan section) Acute Rehab OT Goals Patient Stated Goal: To return to baseline OT Goal Formulation: With patient Time For Goal Achievement: 07/05/19 Potential to Achieve Goals: Good  OT Frequency: Min 2X/week   Barriers to D/C:            Co-evaluation PT/OT/SLP Co-Evaluation/Treatment: Yes Reason for Co-Treatment: Complexity of the patient's impairments (multi-system involvement);To address functional/ADL transfers;For patient/therapist safety PT goals addressed during session: Mobility/safety with mobility;Balance OT goals addressed during session: ADL's and self-care;Strengthening/ROM      AM-PAC OT "6 Clicks" Daily Activity     Outcome  Measure Help from another person eating meals?: None Help from another person taking care of personal grooming?: None Help from another person toileting, which includes using toliet, bedpan, or urinal?: A Little Help from another person bathing (including washing, rinsing, drying)?: A Little Help from another person to put on and taking off regular upper body clothing?: None Help from another person to put on and taking off regular lower body clothing?: A Little 6 Click Score: 21   End of Session Nurse Communication: Mobility status  Activity Tolerance: Patient tolerated treatment well Patient left: in chair;with call bell/phone within reach;with chair alarm set;with family/visitor present  OT Visit Diagnosis: Other abnormalities of gait and mobility (R26.89);Hemiplegia and hemiparesis;Other symptoms and signs involving the nervous system (R29.898) Hemiplegia - Right/Left: Right Hemiplegia - dominant/non-dominant: Dominant Hemiplegia - caused by: Cerebral infarction                Time: 4193-7902 OT Time Calculation (min): 33 min Charges:  OT General Charges $OT Visit: 1 Visit OT Evaluation $OT Eval Moderate Complexity: Arial, MSOT, OTR/L Acute Rehabilitation Services [facility] Office Number: [phone] Pager: [phone]  Zenovia Jarred 06/21/2019, 3:10 PM

## 2019-06-21 NOTE — Evaluation (Addendum)
Speech Language Pathology Evaluation Patient Details Name: Blake Carey MRN: [id number] DOB: 28-Apr-1955 Today's Date: 06/21/2019 Time: 6270-3500 SLP Time Calculation (min) (ACUTE ONLY): 12 min  Problem List:  Patient Active Problem List   Diagnosis Date Noted  . Stroke (cerebrum) (HCC) 06/20/2019  . Pre-procedure lab exam 12/31/2018  . Essential hypertension 12/03/2018  . Dyslipidemia 12/03/2018  . Paroxysmal atrial fibrillation (HCC) 12/03/2018  . Obesity (BMI 30-39.9) 12/03/2018   Past Medical History:  Past Medical History:  Diagnosis Date  . Atrial fibrillation (HCC)   . HTN (hypertension)    Past Surgical History:  Past Surgical History:  Procedure Laterality Date  . APPENDECTOMY    . IR CT HEAD LTD  06/20/2019  . IR PERCUTANEOUS ART THROMBECTOMY/INFUSION INTRACRANIAL INC DIAG ANGIO  06/20/2019  . IR US GUIDE VASC ACCESS RIGHT  06/20/2019  . KNEE SURGERY Right    HPI:   Blake Carey is an 64 y.o. male who presents via EMS as a Code Stroke after acute onset of right hemiplegia and aphasia while eating at a restaurant.     Assessment / Plan / Recommendation Clinical Impression   Pt presents with grossly intact ability to follow commands and answer open ended biographical, environmental, and situational questions.  Pt could name objects around his room without difficulty and was slowed but independent with convergent, divergent, and responsive naming tasks.  Pt had some mild groping behaviors with non speech oral motor movements during CN exam; however, these tended to resolve with repeated attempts.  Pt had difficulty sustaining his attention to cognitive tasks which also lead to decreased storage and retrieval of information but I suspect this was at least in part related to lethargy as pt needed intermittent encouragement to keep his eyes open throughout evaluation.  As a result, pt would benefit from skilled ST while inpatient in order to maximize functional independence  and reduce burden of care prior to discharge.     SLP Assessment  SLP Recommendation/Assessment: Patient needs continued Speech Lanaguage Pathology Services SLP Visit Diagnosis: Cognitive communication deficit (R41.841)    Follow Up Recommendations  Other (comment)(to be determined)    Frequency and Duration min 2x/week         SLP Evaluation Cognition  Overall Cognitive Status: Impaired/Different from baseline Arousal/Alertness: Lethargic Orientation Level: Oriented to person;Oriented to place;Disoriented to situation;Oriented to time Attention: Sustained Sustained Attention: Impaired Sustained Attention Impairment: Functional basic;Verbal basic(suspect due to lethargy) Memory: Impaired Memory Impairment: Storage deficit;Retrieval deficit(also suspect due to lethargy) Awareness: Appears intact Problem Solving: Appears intact       Comprehension  Auditory Comprehension Overall Auditory Comprehension: Appears within functional limits for tasks assessed    Expression Expression Primary Mode of Expression: Verbal Verbal Expression Overall Verbal Expression: Appears within functional limits for tasks assessed   Oral / Motor  Oral Motor/Sensory Function Overall Oral Motor/Sensory Function: Within functional limits Motor Speech Overall Motor Speech: Appears within functional limits for tasks assessed   GO                    Maryjane Hurter 06/21/2019, 11:38 AM

## 2019-06-21 NOTE — Evaluation (Signed)
Physical Therapy Evaluation Patient Details Name: Blake Carey MRN: [id number] DOB: 1955-10-06 Today's Date: 06/21/2019   History of Present Illness  64 y.o male admitted for stroke work up after R hemiplegia and aphasia. Imaging positive for Left MCA M1 large vessel occlusion. PMH includes HTN and a-fib, on Eliquis  Clinical Impression  Pt presents to PT with deficits in balance, cognition, coordination, and proprioception. Pt demonstrates deficits in balance during high-level gait tasks as noted below. Pt with impaired finger to nose, demonstrating slowed coordination with eyes open and impaired proprioception by consistently touching his mouth instead of nose when his eyes were closed during task. Family reports pt's cognition appears to be within functional limits, however his processing does appear slowed compared to PT expectations for his profession. Pt will continue to benefit from acute PT POC to progress upon balance, coordination, and proprioception deficits in an effort to restore independence. PT recommends outpatient PT to improve upon balance deficits at the time of discharge.    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)    Equipment Recommendations  None recommended by PT    Recommendations for Other Services       Precautions / Restrictions Precautions Precautions: Fall Restrictions Weight Bearing Restrictions: No      Mobility  Bed Mobility Overal bed mobility: Needs Assistance Bed Mobility: Supine to Sit     Supine to sit: Supervision        Transfers Overall transfer level: Needs assistance Equipment used: None Transfers: Sit to/from Stand Sit to Stand: Supervision            Ambulation/Gait Ambulation/Gait assistance: Supervision Gait Distance (Feet): 200 Feet Assistive device: None Gait Pattern/deviations: Step-through pattern Gait velocity: functional Gait velocity interpretation: >2.62  ft/sec, indicative of community ambulatory General Gait Details: pt with steady step through gait. Pt is able to perform head turns, change gait speed, and stop abruptly without noticeable LOB or gait deviation. Pt performs turns in full circle slowly but this may possibly be due to impaired cognition rather than gait quality. Pt with noticeable balance deviations when stepping/hurdling over objects on floor, but does not require physical assistance to correct  Stairs            Wheelchair Mobility    Modified Rankin (Stroke Patients Only) Modified Rankin (Stroke Patients Only) Pre-Morbid Rankin Score: No symptoms Modified Rankin: Slight disability     Balance Overall balance assessment: Needs assistance Sitting-balance support: No upper extremity supported;Feet supported Sitting balance-Leahy Scale: Good Sitting balance - Comments: supervision in attempts to don socks   Standing balance support: No upper extremity supported;During functional activity Standing balance-Leahy Scale: Good Standing balance comment: supervision to wash hands                             Pertinent Vitals/Pain Pain Assessment: No/denies pain    Home Living Family/patient expects to be discharged to:: Private residence Living Arrangements: Spouse/significant other Available Help at Discharge: Family;Available 24 hours/day Type of Home: House Home Access: Stairs to enter Entrance Stairs-Rails: Can reach both Entrance Stairs-Number of Steps: 3 Home Layout: One level Home Equipment: None      Prior Function Level of Independence: Independent         Comments: electrician, independent at baseline     Hand Dominance        Extremity/Trunk Assessment   Upper Extremity Assessment Upper Extremity Assessment: RUE deficits/detail  RUE Deficits / Details: decreased proprioception RUE Coordination: decreased fine motor;decreased gross motor    Lower Extremity Assessment Lower  Extremity Assessment: Defer to PT evaluation    Cervical / Trunk Assessment Cervical / Trunk Assessment: Normal  Communication   Communication: No difficulties  Cognition Arousal/Alertness: Awake/alert Behavior During Therapy: WFL for tasks assessed/performed Overall Cognitive Status: Impaired/Different from baseline Area of Impairment: Safety/judgement;Problem solving                       Following Commands: Follows multi-step commands with increased time Safety/Judgement: Decreased awareness of deficits Awareness: Intellectual Problem Solving: Slow processing;Decreased initiation;Requires verbal cues General Comments: slow to process, keeping eyes closed often. 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      General Comments General comments (skin integrity, edema, etc.): VSS on RA, pt's daughter interpreting as needed during session    Exercises     Assessment/Plan    PT Assessment Patient needs continued PT services  PT Problem List Decreased balance;Decreased mobility;Decreased coordination;Decreased cognition       PT Treatment Interventions DME instruction;Gait training;Stair training;Functional mobility training;Therapeutic activities;Therapeutic exercise;Balance training;Neuromuscular re-education;Cognitive remediation;Patient/family education    PT Goals (Current goals can be found in the Care Plan section)  Acute Rehab PT Goals Patient Stated Goal: To return to baseline PT Goal Formulation: With patient/family Time For Goal Achievement: 07/05/19 Potential to Achieve Goals: Good Additional Goals Additional Goal #1: Pt will maintain dynamic standing balance within 10 inches of his base of support independently.    Frequency Min 4X/week   Barriers to discharge        Co-evaluation PT/OT/SLP Co-Evaluation/Treatment: Yes Reason for Co-Treatment: Complexity of the patient's impairments (multi-system involvement);To address  functional/ADL transfers;For patient/therapist safety PT goals addressed during session: Mobility/safety with mobility;Balance OT goals addressed during session: ADL's and self-care;Strengthening/ROM       AM-PAC PT "6 Clicks" Mobility  Outcome Measure Help needed turning from your back to your side while in a flat bed without using bedrails?: None Help needed moving from lying on your back to sitting on the side of a flat bed without using bedrails?: None Help needed moving to and from a bed to a chair (including a wheelchair)?: None Help needed standing up from a chair using your arms (e.g., wheelchair or bedside chair)?: None Help needed to walk in hospital room?: None Help needed climbing 3-5 steps with a railing? : A Little 6 Click Score: 23    End of Session   Activity Tolerance: Patient tolerated treatment well Patient left: in chair;with call bell/phone within reach;with family/visitor present;with chair alarm set Nurse Communication: Mobility status PT Visit Diagnosis: Other abnormalities of gait and mobility (R26.89);Other symptoms and signs involving the nervous system (R29.898)    Time: 1324-4010 PT Time Calculation (min) (ACUTE ONLY): 33 min   Charges:   PT Evaluation $PT Eval Moderate Complexity: 1 Mod          Zenaida Niece, PT, DPT Acute Rehabilitation Pager: [phone]   Zenaida Niece 06/21/2019, 3:05 PM

## 2019-06-21 NOTE — Progress Notes (Signed)
ANTICOAGULATION CONSULT NOTE - Initial Consult  Pharmacy Consult for Heparin + Warfarin Indication: stroke  No Known Allergies  Patient Measurements:   Actual body weight: 76.2 kg Ideal body weight: 56.9 kg (125 lb 7.1 oz) Adjusted ideal body weight: 64.6 kg (142 lb 7.4 oz)   Heparin Dosing Weight: 72.5 kg  Vital Signs: Temp: 98.6 F (37 C) (05/23 2000) Temp Source: Oral (05/23 2000) BP: 142/84 (05/23 2000) Pulse Rate: 64 (05/23 1800)  Labs: Recent Labs    06/20/19 1930 06/20/19 1930 06/20/19 1935 06/21/19 1027 06/21/19 1254 06/21/19 2101  HGB 16.0   < > 14.6 13.4  --   --   HCT 47.0  --  45.9 41.1  --   --   PLT  --   --  259 218  --   --   APTT  --   --  27  --   --  69*  LABPROT  --   --  13.2  --   --   --   INR  --   --  1.0  --   --   --   HEPARINUNFRC  --   --   --   --  0.40  --   CREATININE 1.40*  --  1.21 0.90  --   --    < > = values in this interval not displayed.    Estimated Creatinine Clearance: 73.8 mL/min (by C-G formula based on SCr of 0.9 mg/dL).  Assessment: 34 YOM who presented on 5/22 w/ R-hemiplegia and aphasia and found to have an acute stroke. The patient was on Apixaban PTA for hx Afib. Given the new stroke - pharmacy has been consulted to transition to warfarin with a heparin bridge.   Initial aPTT 69 - within goal  Also earlier this evening; Dr Roda Shutters asked warfarin to be stopped d/t possible coiling. Dose already given but further dosing on hold pending ordered from stroke team.   Goal of Therapy:  Heparin level 0.3-0.5 units/ml aPTT 66-85 seconds Monitor platelets by anticoagulation protocol: Yes   Plan:  Continue heparin 900 units/hr  Daily hep lvl aptt cbc  Elmer Sow, PharmD, BCPS, BCCCP Clinical Pharmacist [phone]  Please check AMION for all [facility] Pharmacy numbers  06/21/2019 9:42 PM

## 2019-06-22 ENCOUNTER — Other Ambulatory Visit: Payer: Self-pay | Admitting: Interventional Radiology

## 2019-06-22 ENCOUNTER — Other Ambulatory Visit: Payer: Self-pay | Admitting: Student

## 2019-06-22 LAB — CBC
HCT: 41.6 % (ref 39.0–52.0)
Hemoglobin: 13.1 g/dL (ref 13.0–17.0)
MCH: 26.2 pg (ref 26.0–34.0)
MCHC: 31.5 g/dL (ref 30.0–36.0)
MCV: 83.2 fL (ref 80.0–100.0)
Platelets: 183 10*3/uL (ref 150–400)
RBC: 5 MIL/uL (ref 4.22–5.81)
RDW: 16.7 % — ABNORMAL HIGH (ref 11.5–15.5)
WBC: 16.5 10*3/uL — ABNORMAL HIGH (ref 4.0–10.5)
nRBC: 0 % (ref 0.0–0.2)

## 2019-06-22 LAB — BASIC METABOLIC PANEL
Anion gap: 7 (ref 5–15)
BUN: 19 mg/dL (ref 8–23)
CO2: 26 mmol/L (ref 22–32)
Calcium: 8.2 mg/dL — ABNORMAL LOW (ref 8.9–10.3)
Chloride: 104 mmol/L (ref 98–111)
Creatinine, Ser: 1.01 mg/dL (ref 0.61–1.24)
GFR calc Af Amer: >60 mL/min (ref >60)
GFR calc non Af Amer: >60 mL/min (ref >60)
Glucose, Bld: 84 mg/dL (ref 70–99)
Potassium: 3.9 mmol/L (ref 3.5–5.1)
Sodium: 137 mmol/L (ref 135–145)

## 2019-06-22 LAB — GLUCOSE, CAPILLARY
Glucose-Capillary: 94 mg/dL (ref 70–99)
Glucose-Capillary: 140 mg/dL — ABNORMAL HIGH (ref 70–99)
Glucose-Capillary: 82 mg/dL (ref 70–99)
Glucose-Capillary: 117 mg/dL — ABNORMAL HIGH (ref 70–99)

## 2019-06-22 LAB — APTT: aPTT: 75 seconds — ABNORMAL HIGH (ref 24–36)

## 2019-06-22 LAB — HEPARIN LEVEL (UNFRACTIONATED): Heparin Unfractionated: 0.55 IU/mL (ref 0.30–0.70)

## 2019-06-22 LAB — PROTIME-INR
INR: 1.3 — ABNORMAL HIGH (ref 0.8–1.2)
Prothrombin Time: 15.2 seconds (ref 11.4–15.2)

## 2019-06-22 NOTE — TOC Initial Note (Signed)
Transition of Care [facility]) - Initial/Assessment Note    Patient Details  Name: Blake Carey MRN: [id number] Date of Birth: [birth year]  Transition of [facility]) CM/SW Contact:    Pollie Friar, RN Phone Number: 06/22/2019, 2:13 PM  Clinical Narrative:                 Recommendations are for outpatient therapy. Pt lives in [place].With the assistance of his daughter translating they have decided they want to attend outpatient at [place] will fax the orders to [facility].  Pt is without insurance. He does have a PCP that keeps the cost of medications as low as possible.  Daughter denies issues with transportation. CM has asked TOC the out of pocket cost of lovenox per CM consult.  TOC following.  Expected Discharge Plan: OP Rehab Barriers to Discharge: Continued Medical Work up, Inadequate or no insurance   Patient Goals and CMS Choice   CMS Medicare.gov Compare Post Acute Care list provided to:: Patient Represenative (must comment) Choice offered to / list presented to : Adult Children  Expected Discharge Plan and Services Expected Discharge Plan: OP Rehab   Discharge Planning Services: CM Consult   Living arrangements for the past 2 months: Single Family Home                                      Prior Living Arrangements/Services Living arrangements for the past 2 months: Single Family Home Lives with:: Spouse Patient language and need for interpreter reviewed:: Yes(daughter at the bedside translated) Do you feel safe going back to the place where you live?: Yes      Need for Family Participation in Patient Care: Yes (Comment) Care giver support system in place?: Yes (comment)   Criminal Activity/Legal Involvement Pertinent to Current Situation/Hospitalization: No - Comment as needed  Activities of Daily Living Home Assistive Devices/Equipment: (P) None ADL Screening (condition at time of admission) Patient's cognitive ability  adequate to safely complete daily activities?: (P) No Is the patient deaf or have difficulty hearing?: (P) No Does the patient have difficulty seeing, even when wearing glasses/contacts?: (P) Yes Does the patient have difficulty concentrating, remembering, or making decisions?: (P) Yes Patient able to express need for assistance with ADLs?: (P) No Does the patient have difficulty dressing or bathing?: (P) Yes Independently performs ADLs?: (P) No Communication: (P) Needs assistance Is this a change from baseline?: (P) Change from baseline, expected to last <3 days Dressing (OT): (P) Dependent Is this a change from baseline?: (P) Change from baseline, expected to last <3days Grooming: (P) Dependent Is this a change from baseline?: (P) Change from baseline, expected to last <3 days Feeding: (P) Dependent Is this a change from baseline?: (P) Change from baseline, expected to last <3 days Bathing: (P) Dependent Is this a change from baseline?: (P) Change from baseline, expected to last <3 days Toileting: (P) Dependent Is this a change from baseline?: (P) Change from baseline, expected to last <3 days In/Out Bed: (P) Dependent Is this a change from baseline?: (P) Change from baseline, expected to last <3 days Walks in Home: (P) Dependent Is this a change from baseline?: (P) Change from baseline, expected to last <3 days Does the patient have difficulty walking or climbing stairs?: (P) Yes Weakness of Legs: (P) Right Weakness of Arms/Hands: (P) Right  Permission Sought/Granted  Emotional Assessment Appearance:: Appears stated age   Affect (typically observed): Accepting Orientation: : Oriented to Self, Oriented to Place, Oriented to  Time, Oriented to Situation   Psych Involvement: No (comment)  Admission diagnosis:  Stroke [facility]) [I63.9] Stroke (cerebrum) [facility]) [I63.9] Patient Active Problem List   Diagnosis Date Noted  . Stroke (cerebrum) (HCC) 06/20/2019  .  Pre-procedure lab exam 12/31/2018  . Essential hypertension 12/03/2018  . Dyslipidemia 12/03/2018  . Paroxysmal atrial fibrillation (HCC) 12/03/2018  . Obesity (BMI 30-39.9) 12/03/2018   PCP:  Dema Severin, NP Pharmacy:   [facility] [address], [place] - [address] [place] [address] Phone: [phone] Fax: [phone]     Social Determinants of Health (SDOH) Interventions    Readmission Risk Interventions No flowsheet data found.

## 2019-06-22 NOTE — Progress Notes (Signed)
STROKE TEAM PROGRESS NOTE   INTERVAL HISTORY Patient is sitting in the bedside chair.  His wife and daughter at the bedside.  He is doing well and neurologically stable.  He is on IV heparin drip.  Dr. Salvadore Dom plans to do a com aneurysm endovascular coiling tomorrow.  I discussed risk-benefit of the procedure and risk of rupture and need for necessity of antiplatelet therapy as well as anticoagulation and increased risk of bruising and bleeding and answered questions.  OBJECTIVE Vitals:   06/21/19 2000 06/22/19 0000 06/22/19 0400 06/22/19 0717  BP: (!) 142/84 (!) 149/88 130/74 (!) 143/86  Pulse:  64 74 75  Resp:  18 18 18   Temp: 98.6 F (37 C) 98.4 F (36.9 C) 98.4 F (36.9 C) 97.9 F (36.6 C)  TempSrc: Oral Oral Oral Oral  SpO2:  98% 97% 97%   CBC:  Recent Labs  Lab 06/20/19 1935 06/20/19 1935 06/21/19 1027 06/22/19 0509  WBC 18.9*   < > 14.5* 16.5*  NEUTROABS 14.1*  --   --   --   HGB 14.6   < > 13.4 13.1  HCT 45.9   < > 41.1 41.6  MCV 82.9   < > 82.2 83.2  PLT 259   < > 218 183   < > = values in this interval not displayed.   Basic Metabolic Panel:  Recent Labs  Lab 06/21/19 1027 06/22/19 0509  NA 138 137  K 4.1 3.9  CL 108 104  CO2 26 26  GLUCOSE 95 84  BUN 23 19  CREATININE 0.90 1.01  CALCIUM 8.3* 8.2*   Lipid Panel:     Component Value Date/Time   CHOL 212 (H) 06/21/2019 0236   TRIG 94 06/21/2019 0236   HDL 50 06/21/2019 0236   CHOLHDL 4.2 06/21/2019 0236   VLDL 19 06/21/2019 0236   LDLCALC 143 (H) 06/21/2019 0236   HgbA1c:  Lab Results  Component Value Date   HGBA1C 6.0 (H) 06/21/2019   Urine Drug Screen:     Component Value Date/Time   LABOPIA NONE DETECTED 06/21/2019 0753   COCAINSCRNUR NONE DETECTED 06/21/2019 0753   LABBENZ NONE DETECTED 06/21/2019 0753   AMPHETMU NONE DETECTED 06/21/2019 0753   THCU NONE DETECTED 06/21/2019 0753   LABBARB NONE DETECTED 06/21/2019 0753    Alcohol Level No results found for: ETH  IMAGING  past 24h No results found.   PHYSICAL EXAM   General - Well nourished, well developed, in no apparent distress.  Ophthalmologic - fundi not visualized due to noncooperation.  Cardiovascular - irregularly irregular heart rate and rhythm    Mental Status -  Level of arousal and orientation to time, place, and person were intact. Language including expression, naming, repetition, comprehension was assessed and found intact.  Cranial Nerves II - XII - II - Visual field intact OU. III, IV, VI - Extraocular movements intact. V - Facial sensation intact bilaterally. VII - Facial movement intact bilaterally. VIII - Hearing & vestibular intact bilaterally. X - Palate elevates symmetrically. XI - Chin turning & shoulder shrug intact bilaterally. XII - Tongue protrusion intact.  Motor Strength - The patient's strength was normal in all extremities and pronator drift was absent.  Bulk was normal and fasciculations were absent.   Motor Tone - Muscle tone was assessed at the neck and appendages and was normal.  Diminished fine finger movements on the right.  Orbits left over right upper extremity.  Mild weakness of right grip  Reflexes - The patient's reflexes were symmetrical in all extremities and he had no pathological reflexes.  Sensory - Light touch, temperature/pinprick were assessed and were symmetrical.    Coordination - The patient had normal movements in the hands and feet with no ataxia or dysmetria.  Tremor was absent.  Gait and Station - deferred.   ASSESSMENT/PLAN Mr. Blake Carey is a 64 y.o. male with history of HTN and a-fib, on Eliquis, who presents via EMS as a Code Stroke after acute onset of right hemiplegia, aphasia and unable to follow instructions. while eating at a restaurant this evening. Time of onset is the same as LKN: 1845. He did not receive IV t-PA due to anticoagulation therapy. IR - left M1 occlusion with single pass of aspiration thrombectomy restoring  complete flow through the M1.  Stroke: Scattered punctate infarcts left MCA territory due to left M1 occlusion s/p IR with TICI3 reperfusion, embolic due to A. fib even on Eliquis. Large vessel disease also possible given CTA/MRA findings  CT Head - Normal for age non contrast CT appearance of the brain. ASPECTS 10.   CTA H&N - Positive for Left MCA M1 large vessel occlusion. Additionally, the study is positive for: - Severe stenosis of the Dominant Left Vertebral Artery V4 segment, - Moderate distal Basilar Artery stenosis, - severe Right PCA P2 stenosis, - mild to moderate Right MCA branch irregularity.  MRI head - Few, tiny acute infarcts in the left MCA distribution, primarily at the basal ganglia. There is a punctate acute cortical infarct in the high right frontal lobe.  MRA head - treated left M1 segment remains patent with irregularity from atherosclerosis or treated thrombus. The upper division left M2 branch is severely stenotic with focal appearance likely reflecting atherosclerosis. high-grade right supraclinoid ICA stenosis. tandem high-grade P2 and P3 segment narrowings  2D Echo - 04/01/19 - EF 60 - 65%. No cardiac source of emboli identified.   Sars Corona Virus 2 - negative  LDL - 143  HgbA1c - 6.0  UDS - negative  VTE prophylaxis - heparin IV  Eliquis (apixaban) daily prior to admission, now on heparin IV. Plan for coumadin with INR goal 2-3 (family not able to afford $500 / month eliquis anymore). 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  MRA head confirmed the finding  Contacted Dr. Sherlon Handing Monday a.m. for further management (Dr. Loreta Ave has informed Dr. Sherlon Handing)  On heparin IV  Not put on coumadin yet, in case pt needs coiling this admission   Dr. Sherlon Handing would like  cerebral angio, may do today  NPO now  Afib with bradycardia  HR 40s  Follows with Dr. Tawanna [facility] in [place]  On Eliquis PTA  Now on heparin IV  Due to cost, will switch to Coumadin with INR 2-3 for long term AC  Close monitor heart rate  Did not resume home Coreg  Hypertension  Home BP meds: amlodipine ; HCTZ ; Zestril ; Coreg  Off cleviprex  Stable . Long-term BP goal normotensive  Hyperlipidemia  Home Lipid lowering medication: none  LDL 143, goal < 70  Current lipid lowering medication: Lipitor 40  Continue statin at discharge  Other Stroke Risk Factors  Advanced age  Obesity, recommend weight loss, diet and exercise as appropriate   Other  Active Problems  Leukocytosis - 18.9->14.5->16.5 (afebrile)   AKI - creatinine - 1.01 - resolved  Hospital day # 2  Patient presented with left MCA occlusion due to atrial fibrillation and was treated with successful mechanical thrombectomy and clinically is doing well.  He needs to be on anticoagulation and was having trouble tolerating Eliquis and may perhaps be switched to warfarin.  He also however has a 5 mm unruptured a common rhythm which carries risk for subarachnoid hemorrhage and it may be prudent to call this endovascularly prior to resuming anticoagulation with warfarin.  I discussed risk-benefit with the patient, wife and daughter and answered questions.  They are agreeable to discussing with Dr. Ladean Raya risk-benefit of the procedure which may be scheduled for tomorrow if willing.  Discussed with interventional neuroradiology  Greater than 50% time during this 35-minute visit was spent on counseling and coordination of care and discussion with care team and patient and family Antony Contras, MD To contact Stroke Continuity provider, please refer to [email or web address] After hours, contact General Neurology

## 2019-06-22 NOTE — Consult Note (Signed)
Chief Complaint: [facility]  Referring Physician(s): Micki RileySethi, Pramod S  Supervising Physician: Baldemar Lenise Macedo Rodrigues, [place]  [facility]  History of Present Illness: Blake Carey is a 64 y.o. male with a past medical history of hypertension, atrial fibrillation on chronic anticoagulation with Eliquis, and CVA 05/2019. He [facility] to [facility]/2019 via EMS with complaints of right-sided weakness and speech difficulty. Code stroke was activated. CT head revealed no acute abnormalities. CTA head/neck revealed a left MCA M1 LVO along with an incidental finding of an ACOM aneurysm. Patient was admitted for further management. He underwent an image-guided cerebral arteriogram with emergent mechanical thrombectomy of left MCA M1 occlusion achieving a TICI 3 revascularization via right femoral approach 06/20/2019 by Dr. Loreta AveWagner. Due to need to re-start anticoagulation coupled with risk of aneurysm rupture, neurology recommend treatment of aneurysm during this admission prior to re-starting patient's anticoagulation.  CTA head/necl 06/20/2019: 1. Positive for Left MCA M1 large vessel occlusion. 2. 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 3. Preliminary results of the above were communicated to Dr. Otelia LimesLindzen at 8:02 pm by text page via the [facility] messaging system. 4. Additionally, the study is positive for: - Severe stenosis of the Dominant Left Vertebral Artery V4 segment, - Moderate distal Basilar Artery stenosis, - severe Right PCA P2 stenosis, - mild to moderate Right MCA branch irregularity.  NIR consulted by Dr. Pearlean BrownieSethi for management of an incidental finding of ACOM aneurysm. Patient awake and alert sitting in bed. Accompanied by wife and daughter at bedside. Complains of difficulty urinating. Denies fever, chills, chest pain, dyspnea, abdominal pain, or headache.   Past Medical  History:  Diagnosis Date  . Atrial fibrillation (HCC)   . HTN (hypertension)     Past Surgical History:  Procedure Laterality Date  . APPENDECTOMY    . IR CT HEAD LTD  06/20/2019  . IR PERCUTANEOUS ART THROMBECTOMY/INFUSION INTRACRANIAL INC DIAG ANGIO  06/20/2019  . IR US GUIDE VASC ACCESS RIGHT  06/20/2019  . KNEE SURGERY Right   . RADIOLOGY WITH ANESTHESIA N/A 06/20/2019   Procedure: IR WITH ANESTHESIA;  Surgeon: Julieanne Cottoneveshwar, Sanjeev, MD;  Location: [facility] OR;  Service: Radiology;  Laterality: N/A;    Allergies: Patient has no known allergies.  Medications: Prior to Admission medications   Medication Sig Start Date End Date Taking? Authorizing Provider  amLODipine (NORVASC) 5 MG tablet Take 1 tablet (5 mg total) by mouth daily. Patient taking differently: Take 2.5 mg by mouth daily.  06/12/19 09/10/19 Yes Tobb, Kardie, DO  apixaban (ELIQUIS) 5 MG TABS tablet Take 1 tablet (5 mg total) by mouth 2 (two) times daily. 05/01/19  Yes Tobb, Kardie, DO  benzonatate (TESSALON) 100 MG capsule Take 100 mg by mouth 3 (three) times daily as needed.   Yes [provider]  carvedilol (COREG) 12.5 MG tablet Take 1 tablet (12.5 mg total) by mouth 2 (two) times daily. 05/01/19 07/30/19 Yes Tobb, Kardie, DO  hydrochlorothiazide (MICROZIDE) 12.5 MG capsule Take 1 capsule (12.5 mg total) by mouth daily. 06/12/19 09/10/19 Yes Tobb, Kardie, DO  lisinopril (ZESTRIL) 20 MG tablet Take 20 mg by mouth daily.   Yes [provider]  PREDNISONE PO Take by mouth as directed.    [provider]     Family History  Problem Relation Age of Onset  . Prostate cancer Father     Social History   Socioeconomic History  .  Marital status: Unknown    Spouse name: Not on file  . Number of children: Not on file  . Years of education: Not on file  . Highest education level: Not on file  Occupational History  . Not on file  Tobacco Use  . Smoking status: Never Smoker  . Smokeless tobacco: Never Used    Substance and Sexual Activity  . Alcohol use: Never  . Drug use: Never  . Sexual activity: Not on file  Other Topics Concern  . Not on file  Social History Narrative  . Not on file   Social Determinants of Health   Financial Resource Strain:   . Difficulty of Paying Living Expenses:   Food Insecurity:   . Worried About Charity fundraiser in the Last Year:   . Arboriculturist in the Last Year:   Transportation Needs:   . Film/video editor (Medical):   Marland Kitchen Lack of Transportation (Non-Medical):   Physical Activity:   . Days of Exercise per Week:   . Minutes of Exercise per Session:   Stress:   . Feeling of Stress :   Social Connections:   . Frequency of Communication with Friends and Family:   . Frequency of Social Gatherings with Friends and Family:   . Attends Religious Services:   . Active Member of Clubs or Organizations:   . Attends Archivist Meetings:   Marland Kitchen Marital Status:      Review of Systems: A 12 point ROS discussed and pertinent positives are indicated in the HPI above.  All other systems are negative.  Review of Systems  Constitutional: Negative for chills and fever.  Respiratory: Negative for shortness of breath and wheezing.   Cardiovascular: Negative for chest pain and palpitations.  Gastrointestinal: Negative for abdominal pain.  Genitourinary: Positive for difficulty urinating.  Neurological: Negative for headaches.  Psychiatric/Behavioral: Negative for behavioral problems and confusion.    Vital Signs: BP 124/66 (BP Location: Left Arm)   Pulse 74   Temp 98.2 F (36.8 C) (Oral)   Resp 18   SpO2 97%   Physical Exam Vitals and nursing note reviewed.  Constitutional:      General: He is not in acute distress.    Appearance: Normal appearance.  Cardiovascular:     Rate and Rhythm: Normal rate and regular rhythm.     Heart sounds: Normal heart sounds. No murmur.  Pulmonary:     Effort: Pulmonary effort is normal. No respiratory  distress.     Breath sounds: Normal breath sounds. No wheezing.  Skin:    General: Skin is warm and dry.  Neurological:     Mental Status: He is alert and oriented to person, place, and time.      MD Evaluation Airway: WNL Heart: WNL Abdomen: WNL Chest/ Lungs: WNL ASA  Classification: 3 Mallampati/Airway Score: Two   Imaging: CT ANGIO NECK W OR WO CONTRAST  Result Date: 06/20/2019 CLINICAL DATA:  64 year old male code stroke presentation with right side weakness. EXAM: CT ANGIOGRAPHY HEAD AND NECK TECHNIQUE: Multidetector CT imaging of the head and neck was performed using the standard protocol during bolus administration of intravenous contrast. Multiplanar CT image reconstructions and MIPs were obtained to evaluate the vascular anatomy. Carotid stenosis measurements (when applicable) are obtained utilizing NASCET criteria, using the distal internal carotid diameter as the denominator. CONTRAST:  76mL OMNIPAQUE IOHEXOL 350 MG/ML SOLN COMPARISON:  Head CT 1939 hours today. FINDINGS: CTA NECK Skeleton: No acute osseous  abnormality identified. Age-appropriate cervical spine degeneration. Upper chest: Negative; patent visible central pulmonary arteries. Other neck: Negative, no acute findings. Aortic arch: 3 vessel arch configuration with mild to moderate mostly soft thoracic aorta plaque. Right carotid system: Brachiocephalic artery and right CCA origin are patent without stenosis despite some plaque. Tortuous proximal right CCA. Minimal plaque at the right ICA origin with no stenosis to the skull base. Left carotid system: Minimal plaque at the left CCA and left ICA origins. Tortuous left ICA distal to the bulb. No stenosis to the skull base. Vertebral arteries: No significant proximal right subclavian artery stenosis despite soft plaque. Patent right vertebral artery origin without stenosis. Non dominant appearing right vertebral artery remains patent to the skull base. No proximal left  subclavian artery stenosis despite plaque. Normal left vertebral artery origin. Tortuous left V1 segment. Mild to moderate stenosis in the left V2 segment on series 7, image 238 presumably due to soft plaque. Otherwise the left vertebral is patent to the skull base. CTA HEAD Early intracranial contrast timing. Posterior circulation: Patent PICA origins which occurred early. The left V4 segment appears dominant however, there is a severe distal V4 stenosis on the left (series 11, image 28 and series 7, image 147. No right V4 stenosis although that side is non dominant. Patent vertebrobasilar junction. Patent although highly diminutive basilar artery, and moderate distal basilar stenosis proximal to the SCA origins (series 11, image 26 and series 12, image 22. SCA origins remain patent. Left PCA origin is patent. Fetal type right PCA origin. Bilateral PCA branches are diminutive. Those on the left are within normal limits. There is additional severe stenosis of the right PCA P2 segment on series 10, image 23. Anterior circulation: Both ICA siphons are patent. And there is no left siphon plaque or stenosis. On the right side there is mild supraclinoid ICA stenosis. Carotid termini remain patent. MCA and left ACA origins are patent. The right A1 appears to be diminutive or absent. The anterior communicating artery is patent although there is a saccular 5 mm aneurysm of the anterior communicating artery directed anteriorly and superiorly (series 7, image 100 and series 11, image 23). Otherwise mild irregularity of the ACA branches. The right MCA M1 segment and bifurcation are patent. Right MCA branches appear patent with mild to moderate irregularity (series 12, image 15). The left MCA M1 occludes distal to the anterior temporal artery origin as seen on series 10, image 23. There is low to intermediate reconstitution of left MCA branches. Venous sinuses: Not evaluated due to early contrast timing. Anatomic variants:  Dominant left vertebral artery. Fetal type right PCA origin. Dominant left and diminutive or absent right ACA A1 segments. Review of the MIP images confirms the above findings IMPRESSION: 1. Positive for Left MCA M1 large vessel occlusion. 2. 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 3. Preliminary results of the above were communicated to Dr. Otelia Limes at 8:02 pm by text page via the [facility] messaging system. 4. Additionally, the study is positive for: - Severe stenosis of the Dominant Left Vertebral Artery V4 segment, - Moderate distal Basilar Artery stenosis, - severe Right PCA P2 stenosis, - mild to moderate Right MCA branch irregularity. Final results were communicated to Dr. Otelia Limes at 8:13 pm on 06/20/2019 by text page via the [facility] messaging system. Electronically Signed   By: Odessa Fleming M.D.   On: 06/20/2019 20:14   MR ANGIO HEAD WO CONTRAST  Result Date: 06/21/2019 CLINICAL  DATA:  Right hemiplegia and aphasia. EXAM: MRI HEAD WITHOUT CONTRAST MRA HEAD WITHOUT CONTRAST TECHNIQUE: Multiplanar, multiecho pulse sequences of the brain and surrounding structures were obtained without intravenous contrast. Angiographic images of the head were obtained using MRA technique without contrast. COMPARISON:  None. FINDINGS: MRI HEAD FINDINGS Brain: Small acute infarcts seen in the deep left cerebral white matter and clustered at the left basal ganglia. Small acute high right frontal and left parietal cortex infarcts. No acute hemorrhage, hydrocephalus, or collection. Few remote white matter insults. Normal brain volume. Vascular: Arterial findings below Skull and upper cervical spine: No focal marrow lesion Sinuses/Orbits: Negative MRA HEAD FINDINGS Atheromatous irregularity of the carotid siphons with high-grade right supraclinoid ICA stenosis. There is a fetal type right PCA with tandem high-grade P2 and P3 segment narrowings. There is high-grade narrowing at the dominant left V4  segment and moderate atheromatous irregularity of the basilar. The upper division M2 branch shows a focal high-grade stenosis. Recanalized left MCA with luminal irregularity from plaque or treated thrombus. Aplastic right A1 segment. Superiorly projecting aneurysm at the anterior communicating artery measuring 5 mm by CTA. IMPRESSION: 1. Few, tiny acute infarcts in the left MCA distribution, primarily at the basal ganglia. There is a punctate acute cortical infarct in the high right frontal lobe. 2. The treated left M1 segment remains patent with irregularity from atherosclerosis or treated thrombus. The upper division left M2 branch is severely stenotic with focal appearance likely reflecting atherosclerosis. 3. Extensive intracranial atherosclerosis with dominant narrowings noted above. 4. 5 mm anterior communicating artery aneurysm. Electronically Signed   By: Marnee Spring M.D.   On: 06/21/2019 10:35   MR BRAIN WO CONTRAST  Result Date: 06/21/2019 CLINICAL DATA:  Right hemiplegia and aphasia. EXAM: MRI HEAD WITHOUT CONTRAST MRA HEAD WITHOUT CONTRAST TECHNIQUE: Multiplanar, multiecho pulse sequences of the brain and surrounding structures were obtained without intravenous contrast. Angiographic images of the head were obtained using MRA technique without contrast. COMPARISON:  None. FINDINGS: MRI HEAD FINDINGS Brain: Small acute infarcts seen in the deep left cerebral white matter and clustered at the left basal ganglia. Small acute high right frontal and left parietal cortex infarcts. No acute hemorrhage, hydrocephalus, or collection. Few remote white matter insults. Normal brain volume. Vascular: Arterial findings below Skull and upper cervical spine: No focal marrow lesion Sinuses/Orbits: Negative MRA HEAD FINDINGS Atheromatous irregularity of the carotid siphons with high-grade right supraclinoid ICA stenosis. There is a fetal type right PCA with tandem high-grade P2 and P3 segment narrowings. There is  high-grade narrowing at the dominant left V4 segment and moderate atheromatous irregularity of the basilar. The upper division M2 branch shows a focal high-grade stenosis. Recanalized left MCA with luminal irregularity from plaque or treated thrombus. Aplastic right A1 segment. Superiorly projecting aneurysm at the anterior communicating artery measuring 5 mm by CTA. IMPRESSION: 1. Few, tiny acute infarcts in the left MCA distribution, primarily at the basal ganglia. There is a punctate acute cortical infarct in the high right frontal lobe. 2. The treated left M1 segment remains patent with irregularity from atherosclerosis or treated thrombus. The upper division left M2 branch is severely stenotic with focal appearance likely reflecting atherosclerosis. 3. Extensive intracranial atherosclerosis with dominant narrowings noted above. 4. 5 mm anterior communicating artery aneurysm. Electronically Signed   By: Marnee Spring M.D.   On: 06/21/2019 10:35   IR CT Head Ltd  Result Date: 06/20/2019 INDICATION: 64 year old male with a history of acute stroke, left M1 occlusion, presents  for treatment EXAM: ULTRASOUND GUIDED ACCESS RIGHT COMMON FEMORAL ARTERY CERVICAL AND CEREBRAL ANGIOGRAM MECHANICAL THROMBECTOMY LEFT M1 ANGIO-SEAL FOR HEMOSTASIS COMPARISON:  CT IMAGING SAME DAY MEDICATIONS: None ANESTHESIA/SEDATION: The anesthesia team was present to provide general endotracheal tube anesthesia and for patient monitoring during the procedure. Interventional neuro radiology nursing staff was also present. CONTRAST:  60 cc Omnipaque 300 FLUOROSCOPY TIME:  Fluoroscopy Time: 3 minutes 56 seconds (965 mGy). COMPLICATIONS: 5 none TECHNIQUE: Informed written consent was obtained from the patient's family after a thorough discussion of the procedural risks, benefits and alternatives. Specific risks discussed include: Bleeding, infection, contrast reaction, kidney injury/failure, need for further procedure/surgery, arterial  injury or dissection, embolization to new territory, intracranial hemorrhage (10-15% risk), neurologic deterioration, cardiopulmonary collapse, death. All questions were addressed. Maximal Sterile Barrier Technique was utilized including during the procedure including caps, mask, sterile gowns, sterile gloves, sterile drape, hand hygiene and skin antiseptic. A timeout was performed prior to the initiation of the procedure. The anesthesia team was present to provide general endotracheal tube anesthesia and for patient monitoring during the procedure. Interventional neuro radiology nursing staff was also present. FINDINGS: Initial Findings: Left internal carotid artery: Mild irregular plaque of the internal carotid artery at the cavernous segment and supraclinoid segment without high-grade stenosis. Left MCA: There is an early temporal branch with frontal polar distribution. There is also flow into temporal branch with supplies the anterior middle and posteroinferior temporal lobes. Occlusion of distal M1 segment, affecting the perfusion of frontal region, parietal region. Initial images demonstrate minimal leptomeningeal collateral flow from the ACA and temporal regions. Left ACA: Left ACA is patent. Minimal leptomeningeal collaterals into the frontal and parietal region. Anterior communicating artery is patent. The left A1 segment perfuses both the right and left ACA territory with patent anterior communicating artery. There does not appear to be any right A1 segment. Aneurysm of the anterior communicating artery. Completion Findings: Left MCA: After treatment with aspiration thrombectomy there is complete reperfusion of the left hemisphere. TICI 3 flow Flat panel CT demonstrates no intracranial hemorrhage or contrast stain. PROCEDURE: The anesthesia team was present to provide general endotracheal tube anesthesia and for patient monitoring during the procedure. Intubation was performed in negative pressure [place] in  neuro IR holding. Interventional neuro radiology nursing staff was also present. Ultrasound survey of the right inguinal region was performed with images stored and sent to PACs. 11 blade scalpel was used to make a small incision. Blunt dissection was performed with US guidance. A micropuncture needle was used access the right common femoral artery under ultrasound. With excellent arterial blood flow returned, an .018 micro wire was passed through the needle, observed to enter the abdominal aorta under fluoroscopy. The needle was removed, and a micropuncture sheath was placed over the wire. The inner dilator and wire were removed, and an 035 wire was advanced under fluoroscopy into the abdominal aorta. The sheath was removed and a 25cm 72F straight vascular sheath was placed. The dilator was removed and the sheath was flushed. Sheath was attached to pressurized and heparinized saline bag for constant forward flow. A coaxial system was then advanced over the 035 wire. This included a 95cm 087 "Walrus" balloon guide with coaxial 125cm Berenstein diagnostic catheter. This was advanced to the proximal descending thoracic aorta. Wire was then removed. Double flush of the catheter was performed. Catheter was then used to select the innominate artery. Angiogram was performed. Using roadmap technique, the catheter was advanced over a standard glide wire into  left cervical ICA, with distal position achieved of the balloon guide. The diagnostic catheter and the wire were removed. Formal angiogram was performed. Road map function was used once the occluded vessel was identified. Copious back flush was performed and the balloon catheter was attached to heparinized and pressurized saline bag for forward flow. A second coaxial system was then advanced through the balloon catheter, which included the selected intermediate catheter, microcatheter, and microwire. In this scenario, the set up included a zoom 55 catheter, a 150 cm  Trevo Provue18 microcatheter, and 014 synchro soft wire. This system was advanced through the balloon guide catheter under the road-map function, with adequate back-flush at the rotating hemostatic valve at that back end of the balloon guide. Microcatheter and the intermediate catheter system were advanced through the terminal ICA and MCA to the level of the occlusion at the distal M1. The micro wire advanced through the occluded segment. The 55 cm catheter was then advanced on the microwire into the proximal M1 segment, and then the microcatheter and the microwire were slowly withdrawn. Aspiration of blood was confirmed at the hub of the aspiration catheter and then the catheter was attached to the proprietary aspiration engine. Aspiration was performed and free return of blood was confirmed. The catheter was then slowly advanced into the face of the thrombus and blood return. 2 minutes time interval was observed and then the catheter was withdrawn into the proximal M1. There was no free return of blood. Upon withdrawal of the fifty-five zoom catheter, gentle aspiration was performed at the hub of the balloon guide. Intermediate catheter was entirely withdrawn from the balloon catheter. Free return of blood was confirmed at the hub of the balloon guide. Repeat angiogram was performed with multiple obliquity. Balloon guide was withdrawn Final angiogram of the cervical ICA performed. The skin at the puncture site was then cleaned with Chlorhexidine. The 8 French sheath was removed and an 47F angioseal was deployed. Flat panel CT was performed. Patient was extubated once the CT was reviewed. Patient tolerated the procedure well and remained hemodynamically stable throughout. No complications were encountered and no significant blood loss encountered. IMPRESSION: Status post ultrasound-guided access right common femoral artery for left-sided cervical and cerebral angiogram and treatment of left M1 occlusion with single  pass of aspiration thrombectomy restoring complete flow through the M1. Angio-Seal for hemostasis. Signed, Yvone Neu. Reyne Dumas, RPVI [facility] Radiology PLAN: ICU status, [phone] Target systolic blood pressure of 120-140 Right hip straight time 6 hours Frequent neurovascular checks Repeat neurologic imaging with CT and/MRI at the discretion of neurology team Electronically Signed   By: Gilmer Mor D.O.   On: 06/20/2019 22:15   IR US Guide Vasc Access Right  Result Date: 06/20/2019 INDICATION: 64 year old male with a history of acute stroke, left M1 occlusion, presents for treatment EXAM: ULTRASOUND GUIDED ACCESS RIGHT COMMON FEMORAL ARTERY CERVICAL AND CEREBRAL ANGIOGRAM MECHANICAL THROMBECTOMY LEFT M1 ANGIO-SEAL FOR HEMOSTASIS COMPARISON:  CT IMAGING SAME DAY MEDICATIONS: None ANESTHESIA/SEDATION: The anesthesia team was present to provide general endotracheal tube anesthesia and for patient monitoring during the procedure. Interventional neuro radiology nursing staff was also present. CONTRAST:  60 cc Omnipaque 300 FLUOROSCOPY TIME:  Fluoroscopy Time: 3 minutes 56 seconds (965 mGy). COMPLICATIONS: 5 none TECHNIQUE: Informed written consent was obtained from the patient's family after a thorough discussion of the procedural risks, benefits and alternatives. Specific risks discussed include: Bleeding, infection, contrast reaction, kidney injury/failure, need for further procedure/surgery, arterial  injury or dissection, embolization to new territory, intracranial hemorrhage (10-15% risk), neurologic deterioration, cardiopulmonary collapse, death. All questions were addressed. Maximal Sterile Barrier Technique was utilized including during the procedure including caps, mask, sterile gowns, sterile gloves, sterile drape, hand hygiene and skin antiseptic. A timeout was performed prior to the initiation of the procedure. The anesthesia team was present to provide  general endotracheal tube anesthesia and for patient monitoring during the procedure. Interventional neuro radiology nursing staff was also present. FINDINGS: Initial Findings: Left internal carotid artery: Mild irregular plaque of the internal carotid artery at the cavernous segment and supraclinoid segment without high-grade stenosis. Left MCA: There is an early temporal branch with frontal polar distribution. There is also flow into temporal branch with supplies the anterior middle and posteroinferior temporal lobes. Occlusion of distal M1 segment, affecting the perfusion of frontal region, parietal region. Initial images demonstrate minimal leptomeningeal collateral flow from the ACA and temporal regions. Left ACA: Left ACA is patent. Minimal leptomeningeal collaterals into the frontal and parietal region. Anterior communicating artery is patent. The left A1 segment perfuses both the right and left ACA territory with patent anterior communicating artery. There does not appear to be any right A1 segment. Aneurysm of the anterior communicating artery. Completion Findings: Left MCA: After treatment with aspiration thrombectomy there is complete reperfusion of the left hemisphere. TICI 3 flow Flat panel CT demonstrates no intracranial hemorrhage or contrast stain. PROCEDURE: The anesthesia team was present to provide general endotracheal tube anesthesia and for patient monitoring during the procedure. Intubation was performed in negative pressure [place] in neuro IR holding. Interventional neuro radiology nursing staff was also present. Ultrasound survey of the right inguinal region was performed with images stored and sent to PACs. 11 blade scalpel was used to make a small incision. Blunt dissection was performed with US guidance. A micropuncture needle was used access the right common femoral artery under ultrasound. With excellent arterial blood flow returned, an .018 micro wire was passed through the needle,  observed to enter the abdominal aorta under fluoroscopy. The needle was removed, and a micropuncture sheath was placed over the wire. The inner dilator and wire were removed, and an 035 wire was advanced under fluoroscopy into the abdominal aorta. The sheath was removed and a 25cm 65F straight vascular sheath was placed. The dilator was removed and the sheath was flushed. Sheath was attached to pressurized and heparinized saline bag for constant forward flow. A coaxial system was then advanced over the 035 wire. This included a 95cm 087 "Walrus" balloon guide with coaxial 125cm Berenstein diagnostic catheter. This was advanced to the proximal descending thoracic aorta. Wire was then removed. Double flush of the catheter was performed. Catheter was then used to select the innominate artery. Angiogram was performed. Using roadmap technique, the catheter was advanced over a standard glide wire into left cervical ICA, with distal position achieved of the balloon guide. The diagnostic catheter and the wire were removed. Formal angiogram was performed. Road map function was used once the occluded vessel was identified. Copious back flush was performed and the balloon catheter was attached to heparinized and pressurized saline bag for forward flow. A second coaxial system was then advanced through the balloon catheter, which included the selected intermediate catheter, microcatheter, and microwire. In this scenario, the set up included a zoom 55 catheter, a 150 cm Trevo Provue18 microcatheter, and 014 synchro soft wire. This system was advanced through the balloon guide catheter under the road-map function, with adequate back-flush  at the rotating hemostatic valve at that back end of the balloon guide. Microcatheter and the intermediate catheter system were advanced through the terminal ICA and MCA to the level of the occlusion at the distal M1. The micro wire advanced through the occluded segment. The 55 cm catheter was  then advanced on the microwire into the proximal M1 segment, and then the microcatheter and the microwire were slowly withdrawn. Aspiration of blood was confirmed at the hub of the aspiration catheter and then the catheter was attached to the proprietary aspiration engine. Aspiration was performed and free return of blood was confirmed. The catheter was then slowly advanced into the face of the thrombus and blood return. 2 minutes time interval was observed and then the catheter was withdrawn into the proximal M1. There was no free return of blood. Upon withdrawal of the fifty-five zoom catheter, gentle aspiration was performed at the hub of the balloon guide. Intermediate catheter was entirely withdrawn from the balloon catheter. Free return of blood was confirmed at the hub of the balloon guide. Repeat angiogram was performed with multiple obliquity. Balloon guide was withdrawn Final angiogram of the cervical ICA performed. The skin at the puncture site was then cleaned with Chlorhexidine. The 8 French sheath was removed and an 5F angioseal was deployed. Flat panel CT was performed. Patient was extubated once the CT was reviewed. Patient tolerated the procedure well and remained hemodynamically stable throughout. No complications were encountered and no significant blood loss encountered. IMPRESSION: Status post ultrasound-guided access right common femoral artery for left-sided cervical and cerebral angiogram and treatment of left M1 occlusion with single pass of aspiration thrombectomy restoring complete flow through the M1. Angio-Seal for hemostasis. Signed, Yvone Neu. Reyne Dumas, RPVI [facility] Radiology PLAN: ICU status, [phone] Target systolic blood pressure of 120-140 Right hip straight time 6 hours Frequent neurovascular checks Repeat neurologic imaging with CT and/MRI at the discretion of neurology team Electronically Signed   By: Gilmer Mor D.O.   On:  06/20/2019 22:15   DG CHEST PORT 1 VIEW  Result Date: 06/21/2019 CLINICAL DATA:  Fever EXAM: PORTABLE CHEST 1 VIEW COMPARISON:  None. FINDINGS: Cardiomegaly. Negative aortic and hilar contours. There is no edema, consolidation, effusion, or pneumothorax. Interstitial crowding at the bases. IMPRESSION: 1. No evident pneumonia or significant atelectasis. 2. Cardiomegaly Electronically Signed   By: Marnee Spring M.D.   On: 06/21/2019 07:34   IR PERCUTANEOUS ART THROMBECTOMY/INFUSION INTRACRANIAL INC DIAG ANGIO  Result Date: 06/20/2019 INDICATION: 64 year old male with a history of acute stroke, left M1 occlusion, presents for treatment EXAM: ULTRASOUND GUIDED ACCESS RIGHT COMMON FEMORAL ARTERY CERVICAL AND CEREBRAL ANGIOGRAM MECHANICAL THROMBECTOMY LEFT M1 ANGIO-SEAL FOR HEMOSTASIS COMPARISON:  CT IMAGING SAME DAY MEDICATIONS: None ANESTHESIA/SEDATION: The anesthesia team was present to provide general endotracheal tube anesthesia and for patient monitoring during the procedure. Interventional neuro radiology nursing staff was also present. CONTRAST:  60 cc Omnipaque 300 FLUOROSCOPY TIME:  Fluoroscopy Time: 3 minutes 56 seconds (965 mGy). COMPLICATIONS: 5 none TECHNIQUE: Informed written consent was obtained from the patient's family after a thorough discussion of the procedural risks, benefits and alternatives. Specific risks discussed include: Bleeding, infection, contrast reaction, kidney injury/failure, need for further procedure/surgery, arterial injury or dissection, embolization to new territory, intracranial hemorrhage (10-15% risk), neurologic deterioration, cardiopulmonary collapse, death. All questions were addressed. Maximal Sterile Barrier Technique was utilized including during the procedure including caps, mask, sterile gowns, sterile gloves, sterile drape, hand hygiene and skin  antiseptic. A timeout was performed prior to the initiation of the procedure. The anesthesia team was present to  provide general endotracheal tube anesthesia and for patient monitoring during the procedure. Interventional neuro radiology nursing staff was also present. FINDINGS: Initial Findings: Left internal carotid artery: Mild irregular plaque of the internal carotid artery at the cavernous segment and supraclinoid segment without high-grade stenosis. Left MCA: There is an early temporal branch with frontal polar distribution. There is also flow into temporal branch with supplies the anterior middle and posteroinferior temporal lobes. Occlusion of distal M1 segment, affecting the perfusion of frontal region, parietal region. Initial images demonstrate minimal leptomeningeal collateral flow from the ACA and temporal regions. Left ACA: Left ACA is patent. Minimal leptomeningeal collaterals into the frontal and parietal region. Anterior communicating artery is patent. The left A1 segment perfuses both the right and left ACA territory with patent anterior communicating artery. There does not appear to be any right A1 segment. Aneurysm of the anterior communicating artery. Completion Findings: Left MCA: After treatment with aspiration thrombectomy there is complete reperfusion of the left hemisphere. TICI 3 flow Flat panel CT demonstrates no intracranial hemorrhage or contrast stain. PROCEDURE: The anesthesia team was present to provide general endotracheal tube anesthesia and for patient monitoring during the procedure. Intubation was performed in negative pressure [place] in neuro IR holding. Interventional neuro radiology nursing staff was also present. Ultrasound survey of the right inguinal region was performed with images stored and sent to PACs. 11 blade scalpel was used to make a small incision. Blunt dissection was performed with US guidance. A micropuncture needle was used access the right common femoral artery under ultrasound. With excellent arterial blood flow returned, an .018 micro wire was passed through the needle,  observed to enter the abdominal aorta under fluoroscopy. The needle was removed, and a micropuncture sheath was placed over the wire. The inner dilator and wire were removed, and an 035 wire was advanced under fluoroscopy into the abdominal aorta. The sheath was removed and a 25cm 87F straight vascular sheath was placed. The dilator was removed and the sheath was flushed. Sheath was attached to pressurized and heparinized saline bag for constant forward flow. A coaxial system was then advanced over the 035 wire. This included a 95cm 087 "Walrus" balloon guide with coaxial 125cm Berenstein diagnostic catheter. This was advanced to the proximal descending thoracic aorta. Wire was then removed. Double flush of the catheter was performed. Catheter was then used to select the innominate artery. Angiogram was performed. Using roadmap technique, the catheter was advanced over a standard glide wire into left cervical ICA, with distal position achieved of the balloon guide. The diagnostic catheter and the wire were removed. Formal angiogram was performed. Road map function was used once the occluded vessel was identified. Copious back flush was performed and the balloon catheter was attached to heparinized and pressurized saline bag for forward flow. A second coaxial system was then advanced through the balloon catheter, which included the selected intermediate catheter, microcatheter, and microwire. In this scenario, the set up included a zoom 55 catheter, a 150 cm Trevo Provue18 microcatheter, and 014 synchro soft wire. This system was advanced through the balloon guide catheter under the road-map function, with adequate back-flush at the rotating hemostatic valve at that back end of the balloon guide. Microcatheter and the intermediate catheter system were advanced through the terminal ICA and MCA to the level of the occlusion at the distal M1. The micro wire advanced through the  occluded segment. The 55 cm catheter was  then advanced on the microwire into the proximal M1 segment, and then the microcatheter and the microwire were slowly withdrawn. Aspiration of blood was confirmed at the hub of the aspiration catheter and then the catheter was attached to the proprietary aspiration engine. Aspiration was performed and free return of blood was confirmed. The catheter was then slowly advanced into the face of the thrombus and blood return. 2 minutes time interval was observed and then the catheter was withdrawn into the proximal M1. There was no free return of blood. Upon withdrawal of the fifty-five zoom catheter, gentle aspiration was performed at the hub of the balloon guide. Intermediate catheter was entirely withdrawn from the balloon catheter. Free return of blood was confirmed at the hub of the balloon guide. Repeat angiogram was performed with multiple obliquity. Balloon guide was withdrawn Final angiogram of the cervical ICA performed. The skin at the puncture site was then cleaned with Chlorhexidine. The 8 French sheath was removed and an 42F angioseal was deployed. Flat panel CT was performed. Patient was extubated once the CT was reviewed. Patient tolerated the procedure well and remained hemodynamically stable throughout. No complications were encountered and no significant blood loss encountered. IMPRESSION: Status post ultrasound-guided access right common femoral artery for left-sided cervical and cerebral angiogram and treatment of left M1 occlusion with single pass of aspiration thrombectomy restoring complete flow through the M1. Angio-Seal for hemostasis. Signed, Yvone Neu. Reyne Dumas, RPVI Vascular and Interventional Radiology Specialists [facility] Radiology PLAN: ICU status, [phone] Target systolic blood pressure of 120-140 Right hip straight time 6 hours Frequent neurovascular checks Repeat neurologic imaging with CT and/MRI at the discretion of neurology team Electronically Signed   By: Gilmer Mor D.O.   On:  06/20/2019 22:15   CT HEAD CODE STROKE WO CONTRAST  Result Date: 06/20/2019 CLINICAL DATA:  Code stroke. 64 year old male with right side weakness. EXAM: CT HEAD WITHOUT CONTRAST TECHNIQUE: Contiguous axial images were obtained from the base of the skull through the vertex without intravenous contrast. COMPARISON:  None. FINDINGS: Brain: Cerebral volume is within normal limits for age. No midline shift, ventriculomegaly, mass effect, evidence of mass lesion, intracranial hemorrhage or evidence of cortically based acute infarction. Gray-white matter differentiation is within normal limits throughout the brain. Vascular: No suspicious intracranial vascular hyperdensity. Skull: No acute osseous abnormality identified. Sinuses/Orbits: Visualized paranasal sinuses and mastoids are clear. Other: Mild left gaze deviation. Visualized scalp soft tissues are within normal limits. ASPECTS [facility] Stroke Program Early CT Score) Total score (0-10 with 10 being normal): 10 IMPRESSION: 1. Normal for age non contrast CT appearance of the brain. ASPECTS 10. 2. These results were communicated to Dr. Otelia Limes at 7:45 pm on 06/20/2019 by text page via the [facility] messaging system. Electronically Signed   By: Odessa Fleming M.D.   On: 06/20/2019 19:45   CT ANGIO HEAD CODE STROKE  Result Date: 06/20/2019 CLINICAL DATA:  64 year old male code stroke presentation with right side weakness. EXAM: CT ANGIOGRAPHY HEAD AND NECK TECHNIQUE: Multidetector CT imaging of the head and neck was performed using the standard protocol during bolus administration of intravenous contrast. Multiplanar CT image reconstructions and MIPs were obtained to evaluate the vascular anatomy. Carotid stenosis measurements (when applicable) are obtained utilizing NASCET criteria, using the distal internal carotid diameter as the denominator. CONTRAST:  76mL OMNIPAQUE IOHEXOL 350 MG/ML SOLN COMPARISON:  Head CT 1939 hours today. FINDINGS: CTA NECK Skeleton: No acute  osseous abnormality identified.  Age-appropriate cervical spine degeneration. Upper chest: Negative; patent visible central pulmonary arteries. Other neck: Negative, no acute findings. Aortic arch: 3 vessel arch configuration with mild to moderate mostly soft thoracic aorta plaque. Right carotid system: Brachiocephalic artery and right CCA origin are patent without stenosis despite some plaque. Tortuous proximal right CCA. Minimal plaque at the right ICA origin with no stenosis to the skull base. Left carotid system: Minimal plaque at the left CCA and left ICA origins. Tortuous left ICA distal to the bulb. No stenosis to the skull base. Vertebral arteries: No significant proximal right subclavian artery stenosis despite soft plaque. Patent right vertebral artery origin without stenosis. Non dominant appearing right vertebral artery remains patent to the skull base. No proximal left subclavian artery stenosis despite plaque. Normal left vertebral artery origin. Tortuous left V1 segment. Mild to moderate stenosis in the left V2 segment on series 7, image 238 presumably due to soft plaque. Otherwise the left vertebral is patent to the skull base. CTA HEAD Early intracranial contrast timing. Posterior circulation: Patent PICA origins which occurred early. The left V4 segment appears dominant however, there is a severe distal V4 stenosis on the left (series 11, image 28 and series 7, image 147. No right V4 stenosis although that side is non dominant. Patent vertebrobasilar junction. Patent although highly diminutive basilar artery, and moderate distal basilar stenosis proximal to the SCA origins (series 11, image 26 and series 12, image 22. SCA origins remain patent. Left PCA origin is patent. Fetal type right PCA origin. Bilateral PCA branches are diminutive. Those on the left are within normal limits. There is additional severe stenosis of the right PCA P2 segment on series 10, image 23. Anterior circulation: Both ICA  siphons are patent. And there is no left siphon plaque or stenosis. On the right side there is mild supraclinoid ICA stenosis. Carotid termini remain patent. MCA and left ACA origins are patent. The right A1 appears to be diminutive or absent. The anterior communicating artery is patent although there is a saccular 5 mm aneurysm of the anterior communicating artery directed anteriorly and superiorly (series 7, image 100 and series 11, image 23). Otherwise mild irregularity of the ACA branches. The right MCA M1 segment and bifurcation are patent. Right MCA branches appear patent with mild to moderate irregularity (series 12, image 15). The left MCA M1 occludes distal to the anterior temporal artery origin as seen on series 10, image 23. There is low to intermediate reconstitution of left MCA branches. Venous sinuses: Not evaluated due to early contrast timing. Anatomic variants: Dominant left vertebral artery. Fetal type right PCA origin. Dominant left and diminutive or absent right ACA A1 segments. Review of the MIP images confirms the above findings IMPRESSION: 1. Positive for Left MCA M1 large vessel occlusion. 2. 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 3. Preliminary results of the above were communicated to Dr. Otelia Limes at 8:02 pm by text page via the [facility] messaging system. 4. Additionally, the study is positive for: - Severe stenosis of the Dominant Left Vertebral Artery V4 segment, - Moderate distal Basilar Artery stenosis, - severe Right PCA P2 stenosis, - mild to moderate Right MCA branch irregularity. Final results were communicated to Dr. Otelia Limes at 8:13 pm on 06/20/2019 by text page via the [facility] messaging system. Electronically Signed   By: Odessa Fleming M.D.   On: 06/20/2019 20:14    Labs:  CBC: Recent Labs    06/12/19 0946 06/20/19 1930  06/20/19 1935 06/21/19 1027 06/22/19 0509  WBC 12.8*  --  18.9* 14.5* 16.5*  HGB 13.8 16.0 14.6 13.4 13.1  HCT 44.6 47.0  45.9 41.1 41.6  PLT 231  --  259 218 183    COAGS: Recent Labs    06/20/19 1935 06/21/19 2101 06/22/19 0509  INR 1.0  --  1.3*  APTT 27 69* 75*    BMP: Recent Labs    06/12/19 0946 06/12/19 0946 06/20/19 1930 06/20/19 1935 06/21/19 1027 06/22/19 0509  NA 137  --  133* 133* 138 137  K 4.5   < > 3.9 4.1 4.1 3.9  CL 100   < > 98 95* 108 104  CO2 23  --   --  GLUCOSE 151*  --  105* 108* 95 84  BUN 22  --  36* 34* 23 19  CALCIUM 9.8  --   --  9.0 8.3* 8.2*  CREATININE 0.99   < > 1.40* 1.21 0.90 1.01  GFRNONAA 80  --   --  >60 >60 >60  GFRAA 92  --   --  >60 >60 >60   < > = values in this interval not displayed.    LIVER FUNCTION TESTS: Recent Labs    06/20/19 1935  BILITOT 1.0  AST 29  ALT 67*  ALKPHOS 57  PROT 6.6  ALBUMIN 3.5     Assessment and Plan:  ACOM aneurysm, incidental finding on CTA head/neck 06/20/2019 during stroke work-up. Plan for image-guided cerebral arteriogram with possible embolization of ACOM aneurysm tentatively for tomorrow 06/23/2019 at 1200 in IR with Dr. Tommie Sams. Dr. Tommie Sams at bedside, discussed risks/benefits/indication of procedure with patient and family, all questions answered and concerns addressed. Patient will be NPO at midnight. Afebrile. WBCs elevated- will repeat CBC with differential in AM per Dr. Tommie Sams. Will hold Heparin per IR protocol (1 hour prior to procedure per Dr. Tommie Sams). INR 1.3 today.  Video-interpretor #[id number] was used throughout today's interaction.  Risks and benefits of cerebral arteriogram with intervention were discussed with the patient including, but not limited to bleeding, infection, vascular injury, contrast induced renal failure, stroke, reperfusion hemorrhage, or even death. This interventional procedure involves the use of X-rays and because of the nature of the planned procedure, it is possible that we will have prolonged use of X-ray  fluoroscopy. Potential radiation risks to you include (but are not limited to) the following: - A slightly elevated risk for cancer  several years later in life. This risk is typically less than 0.5% percent. This risk is low in comparison to the normal incidence of human cancer, which is 33% for women and 50% for men according to the [employer]. - Radiation induced injury can include skin redness, resembling a rash, tissue breakdown / ulcers and hair loss (which can be temporary or permanent).  The likelihood of either of these occurring depends on the difficulty of the procedure and whether you are sensitive to radiation due to previous procedures, disease, or genetic conditions.  IF your procedure requires a prolonged use of radiation, you will be notified and given written instructions for further action.  It is your responsibility to monitor the irradiated area for the 2 weeks following the procedure and to notify your physician if you are concerned that you have suffered a radiation induced injury.   All of the patient's questions were answered, patient is agreeable to proceed. Consent  signed and in chart.   Thank you for this interesting consult.  I greatly enjoyed meeting [facility] and look forward to participating in their care.  A copy of this report was sent to the requesting provider on this date.  Electronically Signed: Elwin Mocha, PA-C 06/22/2019, 12:22 PM   I spent a total of 40 Minutes in face to face in clinical consultation, greater than 50% of which was counseling/coordinating care for [facility] aneurysm/embolization.

## 2019-06-22 NOTE — Progress Notes (Signed)
ANTICOAGULATION CONSULT NOTE  Pharmacy Consult for Heparin  Indication: stroke  No Known Allergies  Patient Measurements:   Actual body weight: 76.2 kg Ideal body weight: 56.9 kg (125 lb 7.1 oz) Adjusted ideal body weight: 64.6 kg (142 lb 7.4 oz)   Heparin Dosing Weight: 72.5 kg  Vital Signs: Temp: 97.9 F (36.6 C) (05/24 0717) Temp Source: Oral (05/24 0717) BP: 143/86 (05/24 0717) Pulse Rate: 75 (05/24 0717)  Labs: Recent Labs    06/20/19 1935 06/20/19 1935 06/21/19 1027 06/21/19 1254 06/21/19 2101 06/22/19 0509  HGB 14.6   < > 13.4  --   --  13.1  HCT 45.9  --  41.1  --   --  41.6  PLT 259  --  218  --   --  183  APTT 27  --   --   --  69* 75*  LABPROT 13.2  --   --   --   --  15.2  INR 1.0  --   --   --   --  1.3*  HEPARINUNFRC  --   --   --  0.40  --  0.55  CREATININE 1.21  --  0.90  --   --  1.01   < > = values in this interval not displayed.    Estimated Creatinine Clearance: 65.7 mL/min (by C-G formula based on SCr of 1.01 mg/dL).   Medical History: Past Medical History:  Diagnosis Date  . Atrial fibrillation (HCC)   . HTN (hypertension)     Assessment: 64 YOM who presented on 5/22 w/ R-hemiplegia and aphasia and found to have an acute stroke. The patient was on Apixaban PTA for hx Afib. Given the new stroke - pharmacy has been consulted to transition to warfarin with a heparin bridge.  Warfarin currently on hold due to possible procedure  Heparin level slightly supra-therapeutic this AM  Goal of Therapy:  Heparin level 0.3-0.5 Monitor platelets by anticoagulation protocol: Yes   Plan:  Decrease heparin to 800 units / hr Follow up AM labs  Thank you Truman Hayward [phone] 06/22/2019 8:43 AM   **Pharmacist phone directory can now be found on [email or web address] ([email or web address]).  Listed under [facility].

## 2019-06-22 NOTE — Progress Notes (Signed)
  Speech Language Pathology Treatment: Cognitive-Linquistic  Patient Details Name: Blake Carey MRN: [id number] DOB: 06-Aug-1955 Today's Date: 06/22/2019 Time: 1100-1110 SLP Time Calculation (min) (ACUTE ONLY): 10 min  Assessment / Plan / Recommendation Clinical Impression  Pt demonstrates mild word finding deficits in conversation level speech. Pt using circumlocution to 'talk around' certain words. SLP used semantic and phonemic cueing x2. Was not able to further investigate due to arrival of other providers. Will f/u as able. (Pt also NPO for procedure, no PO given).   HPI HPI:  Blake Carey is an 64 y.o. male who presents via EMS as a Code Stroke after acute onset of right hemiplegia and aphasia while eating at a restaurant.        SLP Plan  Continue with current plan of care       Recommendations                   Plan: Continue with current plan of care       GO                Blake Carey, Riley Nearing 06/22/2019, 1:22 PM

## 2019-06-22 NOTE — Progress Notes (Signed)
Referring Physician(s): Code Stroke- Caryl Pina  Supervising Physician: Gilmer Mor  Patient Status:  [facility] - In-pt  Chief Complaint: None  Subjective:  History of acute CVA s/p cerebral arteriogram with emergent mechanical thrombectomy of left MCA M1 occlusion achieving a TICI 3 revascularization via right femoral approach 06/20/2019 by Dr. Loreta Ave. Patient awake and alert laying in bed with no complaints at this time. Can spontaneously move all extremities. Right groin incision c/d/i.  MR/MRA brain/head 06/21/2019: 1. Few, tiny acute infarcts in the left MCA distribution, primarily at the basal ganglia. There is a punctate acute cortical infarct in the high right frontal lobe. 2. The treated left M1 segment remains patent with irregularity from atherosclerosis or treated thrombus. The upper division left M2 branch is severely stenotic with focal appearance likely reflecting atherosclerosis. 3. Extensive intracranial atherosclerosis with dominant narrowings noted above. 4. 5 mm anterior communicating artery aneurysm.   Allergies: Patient has no known allergies.  Medications: Prior to Admission medications   Medication Sig Start Date End Date Taking? Authorizing Provider  amLODipine (NORVASC) 5 MG tablet Take 1 tablet (5 mg total) by mouth daily. Patient taking differently: Take 2.5 mg by mouth daily.  06/12/19 09/10/19 Yes Tobb, Kardie, DO  apixaban (ELIQUIS) 5 MG TABS tablet Take 1 tablet (5 mg total) by mouth 2 (two) times daily. 05/01/19  Yes Tobb, Kardie, DO  benzonatate (TESSALON) 100 MG capsule Take 100 mg by mouth 3 (three) times daily as needed.   Yes [provider]  carvedilol (COREG) 12.5 MG tablet Take 1 tablet (12.5 mg total) by mouth 2 (two) times daily. 05/01/19 07/30/19 Yes Tobb, Kardie, DO  hydrochlorothiazide (MICROZIDE) 12.5 MG capsule Take 1 capsule (12.5 mg total) by mouth daily. 06/12/19 09/10/19 Yes Tobb, Kardie, DO  lisinopril (ZESTRIL) 20 MG tablet  Take 20 mg by mouth daily.   Yes [provider]  PREDNISONE PO Take by mouth as directed.    [provider]     Vital Signs: BP (!) 143/86 (BP Location: Left Arm)   Pulse 75   Temp 97.9 F (36.6 C) (Oral)   Resp 18   SpO2 97%   Physical Exam Vitals and nursing note reviewed.  Constitutional:      General: He is not in acute distress.    Appearance: Normal appearance.  Pulmonary:     Effort: Pulmonary effort is normal. No respiratory distress.  Skin:    General: Skin is warm and dry.     Comments: Right groin incision soft without active bleeding or hematoma.  Neurological:     Mental Status: He is alert.     Comments: Alert, awake, and oriented x3. Speech and comprehension intact. PERRL bilaterally. EOMs intact bilaterally without nystagmus or subjective diplopia. No facial asymmetry. Tongue midline. Can spontaneously move all extremities. No pronator drift. Distal pulses (DPs) 2+ bilaterally.     Imaging: CT ANGIO NECK W OR WO CONTRAST  Result Date: 06/20/2019 CLINICAL DATA:  64 year old male code stroke presentation with right side weakness. EXAM: CT ANGIOGRAPHY HEAD AND NECK TECHNIQUE: Multidetector CT imaging of the head and neck was performed using the standard protocol during bolus administration of intravenous contrast. Multiplanar CT image reconstructions and MIPs were obtained to evaluate the vascular anatomy. Carotid stenosis measurements (when applicable) are obtained utilizing NASCET criteria, using the distal internal carotid diameter as the denominator. CONTRAST:  75mL OMNIPAQUE IOHEXOL 350 MG/ML SOLN COMPARISON:  Head CT 1939 hours today. FINDINGS: CTA NECK Skeleton: No acute  osseous abnormality identified. Age-appropriate cervical spine degeneration. Upper chest: Negative; patent visible central pulmonary arteries. Other neck: Negative, no acute findings. Aortic arch: 3 vessel arch configuration with mild to moderate mostly soft thoracic  aorta plaque. Right carotid system: Brachiocephalic artery and right CCA origin are patent without stenosis despite some plaque. Tortuous proximal right CCA. Minimal plaque at the right ICA origin with no stenosis to the skull base. Left carotid system: Minimal plaque at the left CCA and left ICA origins. Tortuous left ICA distal to the bulb. No stenosis to the skull base. Vertebral arteries: No significant proximal right subclavian artery stenosis despite soft plaque. Patent right vertebral artery origin without stenosis. Non dominant appearing right vertebral artery remains patent to the skull base. No proximal left subclavian artery stenosis despite plaque. Normal left vertebral artery origin. Tortuous left V1 segment. Mild to moderate stenosis in the left V2 segment on series 7, image 238 presumably due to soft plaque. Otherwise the left vertebral is patent to the skull base. CTA HEAD Early intracranial contrast timing. Posterior circulation: Patent PICA origins which occurred early. The left V4 segment appears dominant however, there is a severe distal V4 stenosis on the left (series 11, image 28 and series 7, image 147. No right V4 stenosis although that side is non dominant. Patent vertebrobasilar junction. Patent although highly diminutive basilar artery, and moderate distal basilar stenosis proximal to the SCA origins (series 11, image 26 and series 12, image 22. SCA origins remain patent. Left PCA origin is patent. Fetal type right PCA origin. Bilateral PCA branches are diminutive. Those on the left are within normal limits. There is additional severe stenosis of the right PCA P2 segment on series 10, image 23. Anterior circulation: Both ICA siphons are patent. And there is no left siphon plaque or stenosis. On the right side there is mild supraclinoid ICA stenosis. Carotid termini remain patent. MCA and left ACA origins are patent. The right A1 appears to be diminutive or absent. The anterior  communicating artery is patent although there is a saccular 5 mm aneurysm of the anterior communicating artery directed anteriorly and superiorly (series 7, image 100 and series 11, image 23). Otherwise mild irregularity of the ACA branches. The right MCA M1 segment and bifurcation are patent. Right MCA branches appear patent with mild to moderate irregularity (series 12, image 15). The left MCA M1 occludes distal to the anterior temporal artery origin as seen on series 10, image 23. There is low to intermediate reconstitution of left MCA branches. Venous sinuses: Not evaluated due to early contrast timing. Anatomic variants: Dominant left vertebral artery. Fetal type right PCA origin. Dominant left and diminutive or absent right ACA A1 segments. Review of the MIP images confirms the above findings IMPRESSION: 1. Positive for Left MCA M1 large vessel occlusion. 2. 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 3. Preliminary results of the above were communicated to Dr. Otelia LimesLindzen at 8:02 pm by text page via the [facility] messaging system. 4. Additionally, the study is positive for: - Severe stenosis of the Dominant Left Vertebral Artery V4 segment, - Moderate distal Basilar Artery stenosis, - severe Right PCA P2 stenosis, - mild to moderate Right MCA branch irregularity. Final results were communicated to Dr. Otelia LimesLindzen at 8:13 pm on 06/20/2019 by text page via the [facility] messaging system. Electronically Signed   By: Odessa FlemingH  Hall M.D.   On: 06/20/2019 20:14   MR ANGIO HEAD WO CONTRAST  Result Date: 06/21/2019  CLINICAL DATA:  Right hemiplegia and aphasia. EXAM: MRI HEAD WITHOUT CONTRAST MRA HEAD WITHOUT CONTRAST TECHNIQUE: Multiplanar, multiecho pulse sequences of the brain and surrounding structures were obtained without intravenous contrast. Angiographic images of the head were obtained using MRA technique without contrast. COMPARISON:  None. FINDINGS: MRI HEAD FINDINGS Brain: Small acute  infarcts seen in the deep left cerebral white matter and clustered at the left basal ganglia. Small acute high right frontal and left parietal cortex infarcts. No acute hemorrhage, hydrocephalus, or collection. Few remote white matter insults. Normal brain volume. Vascular: Arterial findings below Skull and upper cervical spine: No focal marrow lesion Sinuses/Orbits: Negative MRA HEAD FINDINGS Atheromatous irregularity of the carotid siphons with high-grade right supraclinoid ICA stenosis. There is a fetal type right PCA with tandem high-grade P2 and P3 segment narrowings. There is high-grade narrowing at the dominant left V4 segment and moderate atheromatous irregularity of the basilar. The upper division M2 branch shows a focal high-grade stenosis. Recanalized left MCA with luminal irregularity from plaque or treated thrombus. Aplastic right A1 segment. Superiorly projecting aneurysm at the anterior communicating artery measuring 5 mm by CTA. IMPRESSION: 1. Few, tiny acute infarcts in the left MCA distribution, primarily at the basal ganglia. There is a punctate acute cortical infarct in the high right frontal lobe. 2. The treated left M1 segment remains patent with irregularity from atherosclerosis or treated thrombus. The upper division left M2 branch is severely stenotic with focal appearance likely reflecting atherosclerosis. 3. Extensive intracranial atherosclerosis with dominant narrowings noted above. 4. 5 mm anterior communicating artery aneurysm. Electronically Signed   By: Marnee Spring M.D.   On: 06/21/2019 10:35   MR BRAIN WO CONTRAST  Result Date: 06/21/2019 CLINICAL DATA:  Right hemiplegia and aphasia. EXAM: MRI HEAD WITHOUT CONTRAST MRA HEAD WITHOUT CONTRAST TECHNIQUE: Multiplanar, multiecho pulse sequences of the brain and surrounding structures were obtained without intravenous contrast. Angiographic images of the head were obtained using MRA technique without contrast. COMPARISON:  None.  FINDINGS: MRI HEAD FINDINGS Brain: Small acute infarcts seen in the deep left cerebral white matter and clustered at the left basal ganglia. Small acute high right frontal and left parietal cortex infarcts. No acute hemorrhage, hydrocephalus, or collection. Few remote white matter insults. Normal brain volume. Vascular: Arterial findings below Skull and upper cervical spine: No focal marrow lesion Sinuses/Orbits: Negative MRA HEAD FINDINGS Atheromatous irregularity of the carotid siphons with high-grade right supraclinoid ICA stenosis. There is a fetal type right PCA with tandem high-grade P2 and P3 segment narrowings. There is high-grade narrowing at the dominant left V4 segment and moderate atheromatous irregularity of the basilar. The upper division M2 branch shows a focal high-grade stenosis. Recanalized left MCA with luminal irregularity from plaque or treated thrombus. Aplastic right A1 segment. Superiorly projecting aneurysm at the anterior communicating artery measuring 5 mm by CTA. IMPRESSION: 1. Few, tiny acute infarcts in the left MCA distribution, primarily at the basal ganglia. There is a punctate acute cortical infarct in the high right frontal lobe. 2. The treated left M1 segment remains patent with irregularity from atherosclerosis or treated thrombus. The upper division left M2 branch is severely stenotic with focal appearance likely reflecting atherosclerosis. 3. Extensive intracranial atherosclerosis with dominant narrowings noted above. 4. 5 mm anterior communicating artery aneurysm. Electronically Signed   By: Marnee Spring M.D.   On: 06/21/2019 10:35   IR CT Head Ltd  Result Date: 06/20/2019 INDICATION: 64 year old male with a history of acute stroke, left M1 occlusion,  presents for treatment EXAM: ULTRASOUND GUIDED ACCESS RIGHT COMMON FEMORAL ARTERY CERVICAL AND CEREBRAL ANGIOGRAM MECHANICAL THROMBECTOMY LEFT M1 ANGIO-SEAL FOR HEMOSTASIS COMPARISON:  CT IMAGING SAME DAY MEDICATIONS: None  ANESTHESIA/SEDATION: The anesthesia team was present to provide general endotracheal tube anesthesia and for patient monitoring during the procedure. Interventional neuro radiology nursing staff was also present. CONTRAST:  60 cc Omnipaque 300 FLUOROSCOPY TIME:  Fluoroscopy Time: 3 minutes 56 seconds (965 mGy). COMPLICATIONS: 5 none TECHNIQUE: Informed written consent was obtained from the patient's family after a thorough discussion of the procedural risks, benefits and alternatives. Specific risks discussed include: Bleeding, infection, contrast reaction, kidney injury/failure, need for further procedure/surgery, arterial injury or dissection, embolization to new territory, intracranial hemorrhage (10-15% risk), neurologic deterioration, cardiopulmonary collapse, death. All questions were addressed. Maximal Sterile Barrier Technique was utilized including during the procedure including caps, mask, sterile gowns, sterile gloves, sterile drape, hand hygiene and skin antiseptic. A timeout was performed prior to the initiation of the procedure. The anesthesia team was present to provide general endotracheal tube anesthesia and for patient monitoring during the procedure. Interventional neuro radiology nursing staff was also present. FINDINGS: Initial Findings: Left internal carotid artery: Mild irregular plaque of the internal carotid artery at the cavernous segment and supraclinoid segment without high-grade stenosis. Left MCA: There is an early temporal branch with frontal polar distribution. There is also flow into temporal branch with supplies the anterior middle and posteroinferior temporal lobes. Occlusion of distal M1 segment, affecting the perfusion of frontal region, parietal region. Initial images demonstrate minimal leptomeningeal collateral flow from the ACA and temporal regions. Left ACA: Left ACA is patent. Minimal leptomeningeal collaterals into the frontal and parietal region. Anterior communicating  artery is patent. The left A1 segment perfuses both the right and left ACA territory with patent anterior communicating artery. There does not appear to be any right A1 segment. Aneurysm of the anterior communicating artery. Completion Findings: Left MCA: After treatment with aspiration thrombectomy there is complete reperfusion of the left hemisphere. TICI 3 flow Flat panel CT demonstrates no intracranial hemorrhage or contrast stain. PROCEDURE: The anesthesia team was present to provide general endotracheal tube anesthesia and for patient monitoring during the procedure. Intubation was performed in negative pressure [place] in neuro IR holding. Interventional neuro radiology nursing staff was also present. Ultrasound survey of the right inguinal region was performed with images stored and sent to PACs. 11 blade scalpel was used to make a small incision. Blunt dissection was performed with US guidance. A micropuncture needle was used access the right common femoral artery under ultrasound. With excellent arterial blood flow returned, an .018 micro wire was passed through the needle, observed to enter the abdominal aorta under fluoroscopy. The needle was removed, and a micropuncture sheath was placed over the wire. The inner dilator and wire were removed, and an 035 wire was advanced under fluoroscopy into the abdominal aorta. The sheath was removed and a 25cm 72F straight vascular sheath was placed. The dilator was removed and the sheath was flushed. Sheath was attached to pressurized and heparinized saline bag for constant forward flow. A coaxial system was then advanced over the 035 wire. This included a 95cm 087 "Walrus" balloon guide with coaxial 125cm Berenstein diagnostic catheter. This was advanced to the proximal descending thoracic aorta. Wire was then removed. Double flush of the catheter was performed. Catheter was then used to select the innominate artery. Angiogram was performed. Using roadmap technique,  the catheter was advanced over a standard glide  wire into left cervical ICA, with distal position achieved of the balloon guide. The diagnostic catheter and the wire were removed. Formal angiogram was performed. Road map function was used once the occluded vessel was identified. Copious back flush was performed and the balloon catheter was attached to heparinized and pressurized saline bag for forward flow. A second coaxial system was then advanced through the balloon catheter, which included the selected intermediate catheter, microcatheter, and microwire. In this scenario, the set up included a zoom 55 catheter, a 150 cm Trevo Provue18 microcatheter, and 014 synchro soft wire. This system was advanced through the balloon guide catheter under the road-map function, with adequate back-flush at the rotating hemostatic valve at that back end of the balloon guide. Microcatheter and the intermediate catheter system were advanced through the terminal ICA and MCA to the level of the occlusion at the distal M1. The micro wire advanced through the occluded segment. The 55 cm catheter was then advanced on the microwire into the proximal M1 segment, and then the microcatheter and the microwire were slowly withdrawn. Aspiration of blood was confirmed at the hub of the aspiration catheter and then the catheter was attached to the proprietary aspiration engine. Aspiration was performed and free return of blood was confirmed. The catheter was then slowly advanced into the face of the thrombus and blood return. 2 minutes time interval was observed and then the catheter was withdrawn into the proximal M1. There was no free return of blood. Upon withdrawal of the fifty-five zoom catheter, gentle aspiration was performed at the hub of the balloon guide. Intermediate catheter was entirely withdrawn from the balloon catheter. Free return of blood was confirmed at the hub of the balloon guide. Repeat angiogram was performed with  multiple obliquity. Balloon guide was withdrawn Final angiogram of the cervical ICA performed. The skin at the puncture site was then cleaned with Chlorhexidine. The 8 French sheath was removed and an 42F angioseal was deployed. Flat panel CT was performed. Patient was extubated once the CT was reviewed. Patient tolerated the procedure well and remained hemodynamically stable throughout. No complications were encountered and no significant blood loss encountered. IMPRESSION: Status post ultrasound-guided access right common femoral artery for left-sided cervical and cerebral angiogram and treatment of left M1 occlusion with single pass of aspiration thrombectomy restoring complete flow through the M1. Angio-Seal for hemostasis. Signed, Yvone Neu. Reyne Dumas, RPVI [facility] Radiology PLAN: ICU status, [phone] Target systolic blood pressure of 120-140 Right hip straight time 6 hours Frequent neurovascular checks Repeat neurologic imaging with CT and/MRI at the discretion of neurology team Electronically Signed   By: Gilmer Mor D.O.   On: 06/20/2019 22:15   IR US Guide Vasc Access Right  Result Date: 06/20/2019 INDICATION: 64 year old male with a history of acute stroke, left M1 occlusion, presents for treatment EXAM: ULTRASOUND GUIDED ACCESS RIGHT COMMON FEMORAL ARTERY CERVICAL AND CEREBRAL ANGIOGRAM MECHANICAL THROMBECTOMY LEFT M1 ANGIO-SEAL FOR HEMOSTASIS COMPARISON:  CT IMAGING SAME DAY MEDICATIONS: None ANESTHESIA/SEDATION: The anesthesia team was present to provide general endotracheal tube anesthesia and for patient monitoring during the procedure. Interventional neuro radiology nursing staff was also present. CONTRAST:  60 cc Omnipaque 300 FLUOROSCOPY TIME:  Fluoroscopy Time: 3 minutes 56 seconds (965 mGy). COMPLICATIONS: 5 none TECHNIQUE: Informed written consent was obtained from the patient's family after a thorough discussion of the procedural risks,  benefits and alternatives. Specific risks discussed include: Bleeding, infection, contrast reaction, kidney injury/failure, need for further  procedure/surgery, arterial injury or dissection, embolization to new territory, intracranial hemorrhage (10-15% risk), neurologic deterioration, cardiopulmonary collapse, death. All questions were addressed. Maximal Sterile Barrier Technique was utilized including during the procedure including caps, mask, sterile gowns, sterile gloves, sterile drape, hand hygiene and skin antiseptic. A timeout was performed prior to the initiation of the procedure. The anesthesia team was present to provide general endotracheal tube anesthesia and for patient monitoring during the procedure. Interventional neuro radiology nursing staff was also present. FINDINGS: Initial Findings: Left internal carotid artery: Mild irregular plaque of the internal carotid artery at the cavernous segment and supraclinoid segment without high-grade stenosis. Left MCA: There is an early temporal branch with frontal polar distribution. There is also flow into temporal branch with supplies the anterior middle and posteroinferior temporal lobes. Occlusion of distal M1 segment, affecting the perfusion of frontal region, parietal region. Initial images demonstrate minimal leptomeningeal collateral flow from the ACA and temporal regions. Left ACA: Left ACA is patent. Minimal leptomeningeal collaterals into the frontal and parietal region. Anterior communicating artery is patent. The left A1 segment perfuses both the right and left ACA territory with patent anterior communicating artery. There does not appear to be any right A1 segment. Aneurysm of the anterior communicating artery. Completion Findings: Left MCA: After treatment with aspiration thrombectomy there is complete reperfusion of the left hemisphere. TICI 3 flow Flat panel CT demonstrates no intracranial hemorrhage or contrast stain. PROCEDURE: The anesthesia  team was present to provide general endotracheal tube anesthesia and for patient monitoring during the procedure. Intubation was performed in negative pressure [place] in neuro IR holding. Interventional neuro radiology nursing staff was also present. Ultrasound survey of the right inguinal region was performed with images stored and sent to PACs. 11 blade scalpel was used to make a small incision. Blunt dissection was performed with US guidance. A micropuncture needle was used access the right common femoral artery under ultrasound. With excellent arterial blood flow returned, an .018 micro wire was passed through the needle, observed to enter the abdominal aorta under fluoroscopy. The needle was removed, and a micropuncture sheath was placed over the wire. The inner dilator and wire were removed, and an 035 wire was advanced under fluoroscopy into the abdominal aorta. The sheath was removed and a 25cm 37F straight vascular sheath was placed. The dilator was removed and the sheath was flushed. Sheath was attached to pressurized and heparinized saline bag for constant forward flow. A coaxial system was then advanced over the 035 wire. This included a 95cm 087 "Walrus" balloon guide with coaxial 125cm Berenstein diagnostic catheter. This was advanced to the proximal descending thoracic aorta. Wire was then removed. Double flush of the catheter was performed. Catheter was then used to select the innominate artery. Angiogram was performed. Using roadmap technique, the catheter was advanced over a standard glide wire into left cervical ICA, with distal position achieved of the balloon guide. The diagnostic catheter and the wire were removed. Formal angiogram was performed. Road map function was used once the occluded vessel was identified. Copious back flush was performed and the balloon catheter was attached to heparinized and pressurized saline bag for forward flow. A second coaxial system was then advanced through the  balloon catheter, which included the selected intermediate catheter, microcatheter, and microwire. In this scenario, the set up included a zoom 55 catheter, a 150 cm Trevo Provue18 microcatheter, and 014 synchro soft wire. This system was advanced through the balloon guide catheter under the road-map function, with  adequate back-flush at the rotating hemostatic valve at that back end of the balloon guide. Microcatheter and the intermediate catheter system were advanced through the terminal ICA and MCA to the level of the occlusion at the distal M1. The micro wire advanced through the occluded segment. The 55 cm catheter was then advanced on the microwire into the proximal M1 segment, and then the microcatheter and the microwire were slowly withdrawn. Aspiration of blood was confirmed at the hub of the aspiration catheter and then the catheter was attached to the proprietary aspiration engine. Aspiration was performed and free return of blood was confirmed. The catheter was then slowly advanced into the face of the thrombus and blood return. 2 minutes time interval was observed and then the catheter was withdrawn into the proximal M1. There was no free return of blood. Upon withdrawal of the fifty-five zoom catheter, gentle aspiration was performed at the hub of the balloon guide. Intermediate catheter was entirely withdrawn from the balloon catheter. Free return of blood was confirmed at the hub of the balloon guide. Repeat angiogram was performed with multiple obliquity. Balloon guide was withdrawn Final angiogram of the cervical ICA performed. The skin at the puncture site was then cleaned with Chlorhexidine. The 8 French sheath was removed and an 38F angioseal was deployed. Flat panel CT was performed. Patient was extubated once the CT was reviewed. Patient tolerated the procedure well and remained hemodynamically stable throughout. No complications were encountered and no significant blood loss encountered.  IMPRESSION: Status post ultrasound-guided access right common femoral artery for left-sided cervical and cerebral angiogram and treatment of left M1 occlusion with single pass of aspiration thrombectomy restoring complete flow through the M1. Angio-Seal for hemostasis. Signed, Yvone Neu. Reyne Dumas, RPVI [facility] Interventional Radiology Specialists [facility] Radiology PLAN: ICU status, [id number] Target systolic blood pressure of 120-140 Right hip straight time 6 hours Frequent neurovascular checks Repeat neurologic imaging with CT and/MRI at the discretion of neurology team Electronically Signed   By: Gilmer Mor D.O.   On: 06/20/2019 22:15   DG CHEST PORT 1 VIEW  Result Date: 06/21/2019 CLINICAL DATA:  Fever EXAM: PORTABLE CHEST 1 VIEW COMPARISON:  None. FINDINGS: Cardiomegaly. Negative aortic and hilar contours. There is no edema, consolidation, effusion, or pneumothorax. Interstitial crowding at the bases. IMPRESSION: 1. No evident pneumonia or significant atelectasis. 2. Cardiomegaly Electronically Signed   By: Marnee Spring M.D.   On: 06/21/2019 07:34   IR PERCUTANEOUS ART THROMBECTOMY/INFUSION INTRACRANIAL INC DIAG ANGIO  Result Date: 06/20/2019 INDICATION: 65 year old male with a history of acute stroke, left M1 occlusion, presents for treatment EXAM: ULTRASOUND GUIDED ACCESS RIGHT COMMON FEMORAL ARTERY CERVICAL AND CEREBRAL ANGIOGRAM MECHANICAL THROMBECTOMY LEFT M1 ANGIO-SEAL FOR HEMOSTASIS COMPARISON:  CT IMAGING SAME DAY MEDICATIONS: None ANESTHESIA/SEDATION: The anesthesia team was present to provide general endotracheal tube anesthesia and for patient monitoring during the procedure. Interventional neuro radiology nursing staff was also present. CONTRAST:  60 cc Omnipaque 300 FLUOROSCOPY TIME:  Fluoroscopy Time: 3 minutes 56 seconds (965 mGy). COMPLICATIONS: 5 none TECHNIQUE: Informed written consent was obtained from the patient's family after a thorough discussion of the procedural risks,  benefits and alternatives. Specific risks discussed include: Bleeding, infection, contrast reaction, kidney injury/failure, need for further procedure/surgery, arterial injury or dissection, embolization to new territory, intracranial hemorrhage (10-15% risk), neurologic deterioration, cardiopulmonary collapse, death. All questions were addressed. Maximal Sterile Barrier Technique was utilized including during the procedure including caps, mask, sterile gowns, sterile gloves, sterile drape, hand hygiene  and skin antiseptic. A timeout was performed prior to the initiation of the procedure. The anesthesia team was present to provide general endotracheal tube anesthesia and for patient monitoring during the procedure. Interventional neuro radiology nursing staff was also present. FINDINGS: Initial Findings: Left internal carotid artery: Mild irregular plaque of the internal carotid artery at the cavernous segment and supraclinoid segment without high-grade stenosis. Left MCA: There is an early temporal branch with frontal polar distribution. There is also flow into temporal branch with supplies the anterior middle and posteroinferior temporal lobes. Occlusion of distal M1 segment, affecting the perfusion of frontal region, parietal region. Initial images demonstrate minimal leptomeningeal collateral flow from the ACA and temporal regions. Left ACA: Left ACA is patent. Minimal leptomeningeal collaterals into the frontal and parietal region. Anterior communicating artery is patent. The left A1 segment perfuses both the right and left ACA territory with patent anterior communicating artery. There does not appear to be any right A1 segment. Aneurysm of the anterior communicating artery. Completion Findings: Left MCA: After treatment with aspiration thrombectomy there is complete reperfusion of the left hemisphere. TICI 3 flow Flat panel CT demonstrates no intracranial hemorrhage or contrast stain. PROCEDURE: The anesthesia  team was present to provide general endotracheal tube anesthesia and for patient monitoring during the procedure. Intubation was performed in negative pressure [place] in neuro IR holding. Interventional neuro radiology nursing staff was also present. Ultrasound survey of the right inguinal region was performed with images stored and sent to PACs. 11 blade scalpel was used to make a small incision. Blunt dissection was performed with US guidance. A micropuncture needle was used access the right common femoral artery under ultrasound. With excellent arterial blood flow returned, an .018 micro wire was passed through the needle, observed to enter the abdominal aorta under fluoroscopy. The needle was removed, and a micropuncture sheath was placed over the wire. The inner dilator and wire were removed, and an 035 wire was advanced under fluoroscopy into the abdominal aorta. The sheath was removed and a 25cm 58F straight vascular sheath was placed. The dilator was removed and the sheath was flushed. Sheath was attached to pressurized and heparinized saline bag for constant forward flow. A coaxial system was then advanced over the 035 wire. This included a 95cm 087 "Walrus" balloon guide with coaxial 125cm Berenstein diagnostic catheter. This was advanced to the proximal descending thoracic aorta. Wire was then removed. Double flush of the catheter was performed. Catheter was then used to select the innominate artery. Angiogram was performed. Using roadmap technique, the catheter was advanced over a standard glide wire into left cervical ICA, with distal position achieved of the balloon guide. The diagnostic catheter and the wire were removed. Formal angiogram was performed. Road map function was used once the occluded vessel was identified. Copious back flush was performed and the balloon catheter was attached to heparinized and pressurized saline bag for forward flow. A second coaxial system was then advanced through the  balloon catheter, which included the selected intermediate catheter, microcatheter, and microwire. In this scenario, the set up included a zoom 55 catheter, a 150 cm Trevo Provue18 microcatheter, and 014 synchro soft wire. This system was advanced through the balloon guide catheter under the road-map function, with adequate back-flush at the rotating hemostatic valve at that back end of the balloon guide. Microcatheter and the intermediate catheter system were advanced through the terminal ICA and MCA to the level of the occlusion at the distal M1. The micro wire advanced  through the occluded segment. The 55 cm catheter was then advanced on the microwire into the proximal M1 segment, and then the microcatheter and the microwire were slowly withdrawn. Aspiration of blood was confirmed at the hub of the aspiration catheter and then the catheter was attached to the proprietary aspiration engine. Aspiration was performed and free return of blood was confirmed. The catheter was then slowly advanced into the face of the thrombus and blood return. 2 minutes time interval was observed and then the catheter was withdrawn into the proximal M1. There was no free return of blood. Upon withdrawal of the fifty-five zoom catheter, gentle aspiration was performed at the hub of the balloon guide. Intermediate catheter was entirely withdrawn from the balloon catheter. Free return of blood was confirmed at the hub of the balloon guide. Repeat angiogram was performed with multiple obliquity. Balloon guide was withdrawn Final angiogram of the cervical ICA performed. The skin at the puncture site was then cleaned with Chlorhexidine. The 8 French sheath was removed and an 84F angioseal was deployed. Flat panel CT was performed. Patient was extubated once the CT was reviewed. Patient tolerated the procedure well and remained hemodynamically stable throughout. No complications were encountered and no significant blood loss encountered.  IMPRESSION: Status post ultrasound-guided access right common femoral artery for left-sided cervical and cerebral angiogram and treatment of left M1 occlusion with single pass of aspiration thrombectomy restoring complete flow through the M1. Angio-Seal for hemostasis. Signed, Yvone Neu. Reyne Dumas, RPVI Vascular and Interventional Radiology Specialists [facility] Radiology PLAN: ICU status, [phone] Target systolic blood pressure of 120-140 Right hip straight time 6 hours Frequent neurovascular checks Repeat neurologic imaging with CT and/MRI at the discretion of neurology team Electronically Signed   By: Gilmer Mor D.O.   On: 06/20/2019 22:15   CT HEAD CODE STROKE WO CONTRAST  Result Date: 06/20/2019 CLINICAL DATA:  Code stroke. 64 year old male with right side weakness. EXAM: CT HEAD WITHOUT CONTRAST TECHNIQUE: Contiguous axial images were obtained from the base of the skull through the vertex without intravenous contrast. COMPARISON:  None. FINDINGS: Brain: Cerebral volume is within normal limits for age. No midline shift, ventriculomegaly, mass effect, evidence of mass lesion, intracranial hemorrhage or evidence of cortically based acute infarction. Gray-white matter differentiation is within normal limits throughout the brain. Vascular: No suspicious intracranial vascular hyperdensity. Skull: No acute osseous abnormality identified. Sinuses/Orbits: Visualized paranasal sinuses and mastoids are clear. Other: Mild left gaze deviation. Visualized scalp soft tissues are within normal limits. ASPECTS [facility] Stroke Program Early CT Score) Total score (0-10 with 10 being normal): 10 IMPRESSION: 1. Normal for age non contrast CT appearance of the brain. ASPECTS 10. 2. These results were communicated to Dr. Otelia Limes at 7:45 pm on 06/20/2019 by text page via the [facility] messaging system. Electronically Signed   By: Odessa Fleming M.D.   On: 06/20/2019 19:45   CT ANGIO HEAD CODE STROKE  Result Date: 06/20/2019 CLINICAL DATA:   64 year old male code stroke presentation with right side weakness. EXAM: CT ANGIOGRAPHY HEAD AND NECK TECHNIQUE: Multidetector CT imaging of the head and neck was performed using the standard protocol during bolus administration of intravenous contrast. Multiplanar CT image reconstructions and MIPs were obtained to evaluate the vascular anatomy. Carotid stenosis measurements (when applicable) are obtained utilizing NASCET criteria, using the distal internal carotid diameter as the denominator. CONTRAST:  35mL OMNIPAQUE IOHEXOL 350 MG/ML SOLN COMPARISON:  Head CT 1939 hours today. FINDINGS: CTA NECK Skeleton: No acute osseous abnormality  identified. Age-appropriate cervical spine degeneration. Upper chest: Negative; patent visible central pulmonary arteries. Other neck: Negative, no acute findings. Aortic arch: 3 vessel arch configuration with mild to moderate mostly soft thoracic aorta plaque. Right carotid system: Brachiocephalic artery and right CCA origin are patent without stenosis despite some plaque. Tortuous proximal right CCA. Minimal plaque at the right ICA origin with no stenosis to the skull base. Left carotid system: Minimal plaque at the left CCA and left ICA origins. Tortuous left ICA distal to the bulb. No stenosis to the skull base. Vertebral arteries: No significant proximal right subclavian artery stenosis despite soft plaque. Patent right vertebral artery origin without stenosis. Non dominant appearing right vertebral artery remains patent to the skull base. No proximal left subclavian artery stenosis despite plaque. Normal left vertebral artery origin. Tortuous left V1 segment. Mild to moderate stenosis in the left V2 segment on series 7, image 238 presumably due to soft plaque. Otherwise the left vertebral is patent to the skull base. CTA HEAD Early intracranial contrast timing. Posterior circulation: Patent PICA origins which occurred early. The left V4 segment appears dominant however, there  is a severe distal V4 stenosis on the left (series 11, image 28 and series 7, image 147. No right V4 stenosis although that side is non dominant. Patent vertebrobasilar junction. Patent although highly diminutive basilar artery, and moderate distal basilar stenosis proximal to the SCA origins (series 11, image 26 and series 12, image 22. SCA origins remain patent. Left PCA origin is patent. Fetal type right PCA origin. Bilateral PCA branches are diminutive. Those on the left are within normal limits. There is additional severe stenosis of the right PCA P2 segment on series 10, image 23. Anterior circulation: Both ICA siphons are patent. And there is no left siphon plaque or stenosis. On the right side there is mild supraclinoid ICA stenosis. Carotid termini remain patent. MCA and left ACA origins are patent. The right A1 appears to be diminutive or absent. The anterior communicating artery is patent although there is a saccular 5 mm aneurysm of the anterior communicating artery directed anteriorly and superiorly (series 7, image 100 and series 11, image 23). Otherwise mild irregularity of the ACA branches. The right MCA M1 segment and bifurcation are patent. Right MCA branches appear patent with mild to moderate irregularity (series 12, image 15). The left MCA M1 occludes distal to the anterior temporal artery origin as seen on series 10, image 23. There is low to intermediate reconstitution of left MCA branches. Venous sinuses: Not evaluated due to early contrast timing. Anatomic variants: Dominant left vertebral artery. Fetal type right PCA origin. Dominant left and diminutive or absent right ACA A1 segments. Review of the MIP images confirms the above findings IMPRESSION: 1. Positive for Left MCA M1 large vessel occlusion. 2. 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 3. Preliminary results of the above were communicated to Dr. Otelia Limes at 8:02 pm by text page via the [facility]  messaging system. 4. Additionally, the study is positive for: - Severe stenosis of the Dominant Left Vertebral Artery V4 segment, - Moderate distal Basilar Artery stenosis, - severe Right PCA P2 stenosis, - mild to moderate Right MCA branch irregularity. Final results were communicated to Dr. Otelia Limes at 8:13 pm on 06/20/2019 by text page via the [facility] messaging system. Electronically Signed   By: Odessa Fleming M.D.   On: 06/20/2019 20:14    Labs:  CBC: Recent Labs    06/12/19 0946  06/20/19 1930 06/20/19 1935 06/21/19 1027 06/22/19 0509  WBC 12.8*  --  18.9* 14.5* 16.5*  HGB 13.8 16.0 14.6 13.4 13.1  HCT 44.6 47.0 45.9 41.1 41.6  PLT 231  --  259 218 183    COAGS: Recent Labs    06/20/19 1935 06/21/19 2101 06/22/19 0509  INR 1.0  --  1.3*  APTT 27 69* 75*    BMP: Recent Labs    06/12/19 0946 06/12/19 0946 06/20/19 1930 06/20/19 1935 06/21/19 1027 06/22/19 0509  NA 137  --  133* 133* 138 137  K 4.5   < > 3.9 4.1 4.1 3.9  CL 100   < > 98 95* 108 104  CO2 23  --   --  27 26 26   GLUCOSE 151*  --  105* 108* 95 84  BUN 22  --  36* 34* 23 19  CALCIUM 9.8  --   --  9.0 8.3* 8.2*  CREATININE 0.99   < > 1.40* 1.21 0.90 1.01  GFRNONAA 80  --   --  >60 >60 >60  GFRAA 92  --   --  >60 >60 >60   < > = values in this interval not displayed.    LIVER FUNCTION TESTS: Recent Labs    06/20/19 1935  BILITOT 1.0  AST 29  ALT 67*  ALKPHOS 57  PROT 6.6  ALBUMIN 3.5    Assessment and Plan:  History of acute CVA s/p cerebral arteriogram with emergent mechanical thrombectomy of left MCA M1 occlusion achieving a TICI 3 revascularization via right femoral approach 06/20/2019 by Dr. Loreta Ave. Patient's condition stable- no focal neurologic deficits noted, can spontaneously move all extremities. Right groin incision stable, distal pulses (DPs) 2+ bilaterally. Further plans per neurology- appreciate and agree with management. Please call NIR with questions/concerns.  Tele-interpretor  [phone] was used throughout today's interaction.   Electronically Signed: Elwin Mocha, PA-C 06/22/2019, 10:02 AM   I spent a total of 25 Minutes at the the patient's bedside AND on the patient's hospital floor or unit, greater than 50% of which was counseling/coordinating care for CVA s/p revascularization.

## 2019-06-22 NOTE — Progress Notes (Signed)
Occupational Therapy Treatment Patient Details Name: Blake Carey MRN: [id number] DOB: February 11, 1955 Today's Date: 06/22/2019    History of present illness 64 y.o male admitted for stroke work up after R hemiplegia and aphasia. Imaging positive for Left MCA M1 large vessel occlusion. PMH includes HTN and a-fib, on Eliquis   OT comments  Pt making steady progress towards OT goals this session. Session focus on RUE therex to facilitate increased AROM for ADL participation. Pt completed therex as indicated below. Provided level 1 theraputty as well as visual HEP with pictures d/t language barrier and spanish written HEP for [facility]. Pt return demo all therex with good carryover. Pt additionally completed functional mobility with no AD and MIN A. Supervision for toileting and standing grooming tasks at sink. DC plan remains appropriate, will follow acutely per POC.    Follow Up Recommendations  Outpatient OT    Equipment Recommendations  None recommended by OT    Recommendations for Other Services      Precautions / Restrictions Precautions Precautions: Fall Restrictions Weight Bearing Restrictions: No       Mobility Bed Mobility Overal bed mobility: Needs Assistance Bed Mobility: Supine to Sit;Sit to Supine     Supine to sit: Supervision;HOB elevated Sit to supine: Supervision;HOB elevated   General bed mobility comments: supervision for safety  Transfers Overall transfer level: Needs assistance Equipment used: None Transfers: Sit to/from Stand Sit to Stand: Supervision         General transfer comment: supervision for safety from EOB    Balance Overall balance assessment: Needs assistance Sitting-balance support: No upper extremity supported;Feet supported Sitting balance-Leahy Scale: Good     Standing balance support: No upper extremity supported;During functional activity Standing balance-Leahy Scale: Good Standing balance comment: supervision to manage clothing  during toileting and complete hand washing at sink                           ADL either performed or assessed with clinical judgement   ADL Overall ADL's : Needs assistance/impaired     Grooming: Wash/dry hands;Standing;Supervision/safety               Lower Body Dressing: Minimal assistance Lower Body Dressing Details (indicate cue type and reason): pt able to step into underwear with MIN A for standing balance Toilet Transfer: Minimal assistance;Ambulation Toilet Transfer Details (indicate cue type and reason): MIN A mostly to keep gown closed for simulated toilet transfer via functional mobility with no AD; supervision for standina balance while pt stand to urinate Toileting- Clothing Manipulation and Hygiene: Supervision/safety;Sit to/from stand Toileting - Clothing Manipulation Details (indicate cue type and reason): clothing mgmt in standing     Functional mobility during ADLs: Minimal assistance General ADL Comments: pt with improved RUE functional and functional mobility distance this session; sesssion focus on RUE therex via theraputty and [facility] therex as well as functional mobility with no AD     Vision Patient Visual Report: No change from baseline     Perception     Praxis      Cognition Arousal/Alertness: Awake/alert Behavior During Therapy: WFL for tasks assessed/performed Overall Cognitive Status: Within Functional Limits for tasks assessed                                 General Comments: overall WFL for simple tasks; increased time and visual and tactile cues to carryover HEP  Exercises Hand Exercises Digit Composite Flexion: AROM;Right;Seated Composite Extension: AROM;Right;Seated Digit Composite Abduction: AROM;Right;Seated Digit Composite Adduction: AROM;Right;Seated Digit Lifts: AROM;Right;Seated Thumb Abduction: AROM;Right;Seated Thumb Adduction: AROM;Right;Seated Opposition: AROM;Right;Seated Other  Exercises Other Exercises: issued pt level 1 theraputty with visual HEP as well as FMC therex related to therex listed above   Shoulder Instructions       General Comments pts daughter and wife interpreting during session    Pertinent Vitals/ Pain       Pain Assessment: No/denies pain  Home Living                                          Prior Functioning/Environment              Frequency  Min 2X/week        Progress Toward Goals  OT Goals(current goals can now be found in the care plan section)  Progress towards OT goals: Progressing toward goals  Acute Rehab OT Goals Patient Stated Goal: To return to baseline OT Goal Formulation: With patient Time For Goal Achievement: 07/05/19 Potential to Achieve Goals: Good  Plan Discharge plan remains appropriate    Co-evaluation                 AM-PAC OT "6 Clicks" Daily Activity     Outcome Measure   Help from another person eating meals?: None Help from another person taking care of personal grooming?: None Help from another person toileting, which includes using toliet, bedpan, or urinal?: A Little Help from another person bathing (including washing, rinsing, drying)?: A Little Help from another person to put on and taking off regular upper body clothing?: None Help from another person to put on and taking off regular lower body clothing?: A Little 6 Click Score: 21    End of Session    OT Visit Diagnosis: Other abnormalities of gait and mobility (R26.89);Hemiplegia and hemiparesis;Other symptoms and signs involving the nervous system (R29.898) Hemiplegia - Right/Left: Right Hemiplegia - dominant/non-dominant: Dominant Hemiplegia - caused by: Cerebral infarction   Activity Tolerance Patient tolerated treatment well   Patient Left in bed;with call bell/phone within reach;with family/visitor present   Nurse Communication Mobility status        Time: 2878-6767 OT Time Calculation  (min): 17 min  Charges: OT General Charges $OT Visit: 1 Visit OT Treatments $Therapeutic Exercise: 8-22 mins  Audery Amel., COTA/L Acute Rehabilitation Services [phone]    Angelina Pih 06/22/2019, 12:32 PM

## 2019-06-22 NOTE — Plan of Care (Signed)
  Problem: Education: Goal: Knowledge of General Education information will improve Description: Including pain rating scale, medication(s)/side effects and non-pharmacologic comfort measures Outcome: Progressing   Problem: Clinical Measurements: Goal: Will remain free from infection Outcome: Progressing Goal: Respiratory complications will improve Outcome: Progressing   Problem: Activity: Goal: Risk for activity intolerance will decrease Outcome: Progressing   Problem: Nutrition: Goal: Adequate nutrition will be maintained Outcome: Progressing   Problem: Coping: Goal: Level of anxiety will decrease Outcome: Progressing   

## 2019-06-23 ENCOUNTER — Inpatient Hospital Stay (HOSPITAL_COMMUNITY): Payer: Self-pay

## 2019-06-23 ENCOUNTER — Encounter (HOSPITAL_COMMUNITY): Payer: Self-pay | Admitting: Certified Registered"

## 2019-06-23 ENCOUNTER — Encounter (HOSPITAL_COMMUNITY)
Admission: EM | Disposition: A | Discharge status: Home / Self Care | Payer: Self-pay | Source: Home / Self Care | Attending: Neurology

## 2019-06-23 ENCOUNTER — Other Ambulatory Visit (HOSPITAL_COMMUNITY): Payer: Self-pay

## 2019-06-23 LAB — CBC WITH DIFFERENTIAL/PLATELET
Abs Immature Granulocytes: 0.27 10*3/uL — ABNORMAL HIGH (ref 0.00–0.07)
Basophils Absolute: 0.1 10*3/uL (ref 0.0–0.1)
Basophils Relative: 0 %
Eosinophils Absolute: 0 10*3/uL (ref 0.0–0.5)
Eosinophils Relative: 0 %
HCT: 45.1 % (ref 39.0–52.0)
Hemoglobin: 14.6 g/dL (ref 13.0–17.0)
Immature Granulocytes: 1 %
Lymphocytes Relative: 4 %
Lymphs Abs: 1.3 10*3/uL (ref 0.7–4.0)
MCH: 26.4 pg (ref 26.0–34.0)
MCHC: 32.4 g/dL (ref 30.0–36.0)
MCV: 81.7 fL (ref 80.0–100.0)
Monocytes Absolute: 2 10*3/uL — ABNORMAL HIGH (ref 0.1–1.0)
Monocytes Relative: 7 %
Neutro Abs: 24.8 10*3/uL — ABNORMAL HIGH (ref 1.7–7.7)
Neutrophils Relative %: 88 %
Platelets: 195 10*3/uL (ref 150–400)
RBC: 5.52 MIL/uL (ref 4.22–5.81)
RDW: 16.1 % — ABNORMAL HIGH (ref 11.5–15.5)
WBC: 28.3 10*3/uL — ABNORMAL HIGH (ref 4.0–10.5)
nRBC: 0 % (ref 0.0–0.2)

## 2019-06-23 LAB — URINALYSIS, ROUTINE W REFLEX MICROSCOPIC
Bilirubin Urine: NEGATIVE
Glucose, UA: NEGATIVE mg/dL
Ketones, ur: 5 mg/dL — AB
Nitrite: NEGATIVE
Protein, ur: NEGATIVE mg/dL
Specific Gravity, Urine: 1.011 (ref 1.005–1.030)
WBC, UA: >50 WBC/hpf — ABNORMAL HIGH (ref 0–5)
pH: 6 (ref 5.0–8.0)

## 2019-06-23 LAB — BASIC METABOLIC PANEL
Anion gap: 12 (ref 5–15)
BUN: 15 mg/dL (ref 8–23)
CO2: 20 mmol/L — ABNORMAL LOW (ref 22–32)
Calcium: 8.5 mg/dL — ABNORMAL LOW (ref 8.9–10.3)
Chloride: 104 mmol/L (ref 98–111)
Creatinine, Ser: 1.19 mg/dL (ref 0.61–1.24)
GFR calc Af Amer: >60 mL/min (ref >60)
GFR calc non Af Amer: >60 mL/min (ref >60)
Glucose, Bld: 100 mg/dL — ABNORMAL HIGH (ref 70–99)
Potassium: 3.4 mmol/L — ABNORMAL LOW (ref 3.5–5.1)
Sodium: 136 mmol/L (ref 135–145)

## 2019-06-23 LAB — HEPARIN LEVEL (UNFRACTIONATED): Heparin Unfractionated: 0.39 IU/mL (ref 0.30–0.70)

## 2019-06-23 LAB — PROTIME-INR
INR: 1.3 — ABNORMAL HIGH (ref 0.8–1.2)
Prothrombin Time: 16.1 seconds — ABNORMAL HIGH (ref 11.4–15.2)

## 2019-06-23 LAB — GLUCOSE, CAPILLARY
Glucose-Capillary: 97 mg/dL (ref 70–99)
Glucose-Capillary: 99 mg/dL (ref 70–99)
Glucose-Capillary: 109 mg/dL — ABNORMAL HIGH (ref 70–99)
Glucose-Capillary: 156 mg/dL — ABNORMAL HIGH (ref 70–99)
Glucose-Capillary: 138 mg/dL — ABNORMAL HIGH (ref 70–99)

## 2019-06-23 SURGERY — IR WITH ANESTHESIA
Anesthesia: General

## 2019-06-23 MED ORDER — SODIUM CHLORIDE 0.9 % IV BOLUS
500.0000 mL | Freq: Once | INTRAVENOUS | Status: AC
  Administered 2019-06-24: 500 mL via INTRAVENOUS

## 2019-06-23 MED ORDER — POTASSIUM CHLORIDE CRYS ER 20 MEQ PO TBCR
20.0000 meq | EXTENDED_RELEASE_TABLET | ORAL | Status: AC
  Administered 2019-06-23 (×2): 20 meq via ORAL
  Filled 2019-06-23: qty 1

## 2019-06-23 MED ORDER — SODIUM CHLORIDE 0.9 % IV SOLN
2.0000 g | INTRAVENOUS | Status: DC
  Administered 2019-06-23 – 2019-06-25 (×3): 2 g via INTRAVENOUS
  Filled 2019-06-23 (×4): qty 20

## 2019-06-23 NOTE — Progress Notes (Addendum)
NIR Brief Note:  Patient was scheduled for an image-guided cerebral arteriogram with possible embolization of ACOM aneurysm today with Dr. Tommie Sams.  CBC with differential today revealed WBCs elevated to 28.3 today (up from 16.5 yesterday), with absolute neutrophils elevated to 24.8. Discussed case with Dr. Tommie Sams who recommends postponing procedure until infectious agent determined. Will order repeat CXR and UA today. Will repeat AM labs. Diet restarted. Neurology and 3W RN aware.  Discussed above with patient, patient's wife, and patient's daughter at bedside. Silvia, interpretor, at bedside throughout today's discussion. Patient complaining of frequent urination and dysuria. Denies dyspnea or any additional pain aside from dysuria. All questions answered and concerns addressed.   NIR to follow-up after CXR/UA to determine future timing of procedure (will not be today). Further plans per neurology- appreciate and agree with management. NIR to follow.   Waylan Boga Klye Besecker, PA-C 06/23/2019, 12:34 PM    ADDENDUM: CXR unremarkable. UA indicative of UTI. Discussed with Dr. Tommie Sams who recommends starting IV antibiotics this evening and proceeding with procedure tomorrow. Plan for image-guided cerebral arteriogram with possible embolization of ACOM aneurysm tentatively for tomorrow 06/24/2019 at 1200 in IR with Dr. Tommie Sams. Patient to be NPO at midnight, will repeat CBC with differential in AM per Dr. Tommie Sams. Dr. Pearlean Brownie aware and has started Rocephin for UTI. Discussed with patient and wife with help of video-interpreter [phone] (who was present throughout today's interaction)- all questions answered and concerns addressed.  Please call NIR with questions/concerns.   Waylan Boga Dorie Ohms, PA-C 06/23/2019, 5:14 PM

## 2019-06-23 NOTE — Progress Notes (Signed)
ANTICOAGULATION CONSULT NOTE  Pharmacy Consult for Heparin  Indication: stroke  No Known Allergies  Patient Measurements:   Actual body weight: 76.2 kg Ideal body weight: 56.9 kg (125 lb 7.1 oz) Adjusted ideal body weight: 64.6 kg (142 lb 7.4 oz)   Heparin Dosing Weight: 72.5 kg  Vital Signs: Temp: 97.8 F (36.6 C) (05/25 0810) Temp Source: Oral (05/25 0729) BP: 156/86 (05/25 0810) Pulse Rate: 99 (05/25 0810)  Labs: Recent Labs    06/20/19 1935 06/20/19 1935 06/21/19 1027 06/21/19 1027 06/21/19 1254 06/21/19 2101 06/22/19 0509 06/23/19 0647  HGB 14.6   < > 13.4   < >  --   --  13.1 14.6  HCT 45.9   < > 41.1  --   --   --  41.6 45.1  PLT 259   < > 218  --   --   --  183 195  APTT 27  --   --   --   --  69* 75*  --   LABPROT 13.2  --   --   --   --   --  15.2 16.1*  INR 1.0  --   --   --   --   --  1.3* 1.3*  HEPARINUNFRC  --   --   --   --  0.40  --  0.55 0.39  CREATININE 1.21   < > 0.90  --   --   --  1.01 1.19   < > = values in this interval not displayed.    Estimated Creatinine Clearance: 55.8 mL/min (by C-G formula based on SCr of 1.19 mg/dL).   Medical History: Past Medical History:  Diagnosis Date  . Atrial fibrillation (HCC)   . HTN (hypertension)     Assessment: 60 YOM who presented on 5/22 w/ R-hemiplegia and aphasia and found to have an acute stroke. The patient was on Apixaban PTA for hx Afib. Given the new stroke - pharmacy has been consulted to transition to warfarin with a heparin bridge.  Warfarin currently on hold due to possible procedure  Heparin level therapeutic this AM, CBC stable  Goal of Therapy:  Heparin level 0.3-0.5 Monitor platelets by anticoagulation protocol: Yes   Plan:  Continue heparin at 800 units / hr Follow up AM labs  Warfarin on hold  Thank you Okey Regal, Thereasa Parkin [phone] 06/23/2019 8:49 AM   **Pharmacist phone directory can now be found on [email or web address] ([email or web address]).  Listed under [facility].

## 2019-06-23 NOTE — Plan of Care (Signed)
  Problem: Activity: Goal: Risk for activity intolerance will decrease Outcome: Progressing   Problem: Nutrition: Goal: Adequate nutrition will be maintained Outcome: Progressing   Problem: Coping: Goal: Level of anxiety will decrease Outcome: Progressing   

## 2019-06-23 NOTE — Progress Notes (Signed)
Physical Therapy Treatment Patient Details Name: Blake Carey MRN: [id number] DOB: 05/23/1955 Today's Date: 06/23/2019    History of Present Illness 64 y.o male admitted for stroke work up after R hemiplegia and aphasia. Imaging positive for Left MCA M1 large vessel occlusion. PMH includes HTN and a-fib, on Eliquis    PT Comments    Patient seen for mobility progression. Pt presents with impaired balance and overall requires supervision-min guard for OOB mobility. Continue to recommend Outpatient PT for further skilled PT services to maximize independence and safety with mobility.     Follow Up Recommendations  Outpatient PT     Equipment Recommendations  None recommended by PT    Recommendations for Other Services       Precautions / Restrictions Precautions Precautions: Fall Restrictions Weight Bearing Restrictions: No    Mobility  Bed Mobility Overal bed mobility: Modified Independent                Transfers Overall transfer level: Needs assistance Equipment used: None Transfers: Sit to/from Stand Sit to Stand: Supervision         General transfer comment: supervision for safety  Ambulation/Gait Ambulation/Gait assistance: Min guard;Supervision Gait Distance (Feet): 250 Feet Assistive device: None Gait Pattern/deviations: Step-through pattern;Staggering right;Staggering left Gait velocity: functional   General Gait Details: pt with instability and lateral LOB X 3 but no physical assist to recover   Stairs             Wheelchair Mobility    Modified Rankin (Stroke Patients Only) Modified Rankin (Stroke Patients Only) Pre-Morbid Rankin Score: No symptoms Modified Rankin: Slight disability     Balance Overall balance assessment: Needs assistance Sitting-balance support: No upper extremity supported;Feet supported Sitting balance-Leahy Scale: Good     Standing balance support: No upper extremity supported;During functional  activity Standing balance-Leahy Scale: Fair               High level balance activites: Side stepping;Backward walking;Direction changes;Head turns;Turns High Level Balance Comments: increased instability with turning/directional changes            Cognition Arousal/Alertness: Awake/alert Behavior During Therapy: WFL for tasks assessed/performed Overall Cognitive Status: Within Functional Limits for tasks assessed Area of Impairment: Safety/judgement;Problem solving                         Safety/Judgement: Decreased awareness of deficits            Exercises      General Comments        Pertinent Vitals/Pain Pain Assessment: No/denies pain    Home Living                      Prior Function            PT Goals (current goals can now be found in the care plan section) Progress towards PT goals: Progressing toward goals    Frequency    Min 4X/week      PT Plan Current plan remains appropriate    Co-evaluation              AM-PAC PT "6 Clicks" Mobility   Outcome Measure  Help needed turning from your back to your side while in a flat bed without using bedrails?: None Help needed moving from lying on your back to sitting on the side of a flat bed without using bedrails?: None Help needed moving to and from a bed to a  chair (including a wheelchair)?: None Help needed standing up from a chair using your arms (e.g., wheelchair or bedside chair)?: None Help needed to walk in hospital room?: None Help needed climbing 3-5 steps with a railing? : A Little 6 Click Score: 23    End of Session   Activity Tolerance: Patient tolerated treatment well Patient left: with call bell/phone within reach;with family/visitor present;in bed;with bed alarm set Nurse Communication: Mobility status PT Visit Diagnosis: Other abnormalities of gait and mobility (R26.89);Other symptoms and signs involving the nervous system (R29.898)     Time:  9470-7615 PT Time Calculation (min) (ACUTE ONLY): 12 min  Charges:  $Gait Training: 8-22 mins                     Blake Carey, PTA Acute Rehabilitation Services Pager: [phone] Office: [phone]     Blake Carey 06/23/2019, 1:36 PM

## 2019-06-23 NOTE — Plan of Care (Signed)
  Problem: Clinical Measurements: Goal: Ability to maintain clinical measurements within normal limits will improve Outcome: Progressing   

## 2019-06-23 NOTE — Progress Notes (Signed)
STROKE TEAM PROGRESS NOTE   INTERVAL HISTORY WBC increased over night. Etiology unclear. today's procedure on hold. For CXR, UA, Cx and repeat labs.  Neurologically patient is unchanged.  He has no complaints or no new deficits.  His wife and daughter are at the bedside.  Vital signs are stable.  OBJECTIVE Vitals:   06/23/19 0303 06/23/19 0729 06/23/19 0810 06/23/19 1135  BP: (!) 167/93 (!) 135/92 (!) 156/86 132/67  Pulse: 98 94 99 92  Resp: 18 18 16 18   Temp: 98 F (36.7 C) 98.1 F (36.7 C) 97.8 F (36.6 C) 98.2 F (36.8 C)  TempSrc: Oral Oral  Oral  SpO2: 99% 96% 100% 100%   CBC:  Recent Labs  Lab 06/20/19 1935 06/21/19 1027 06/22/19 0509 06/23/19 0647  WBC 18.9*   < > 16.5* 28.3*  NEUTROABS 14.1*  --   --  24.8*  HGB 14.6   < > 13.1 14.6  HCT 45.9   < > 41.6 45.1  MCV 82.9   < > 83.2 81.7  PLT 259   < > 183 195   < > = values in this interval not displayed.   Basic Metabolic Panel:  Recent Labs  Lab 06/22/19 0509 06/23/19 0647  NA 137 136  K 3.9 3.4*  CL 104 104  CO2 26 20*  GLUCOSE 84 100*  BUN 19 15  CREATININE 1.01 1.19  CALCIUM 8.2* 8.5*    IMAGING past 24h DG Chest Port 1 View  Result Date: 06/23/2019 CLINICAL DATA:  Leukocytosis EXAM: PORTABLE CHEST 1 VIEW COMPARISON:  Jun 21, 2019 FINDINGS: Lungs are clear. Heart is upper normal in size with pulmonary vascularity normal. No adenopathy. No bone lesions. IMPRESSION: Lungs clear.  Heart upper normal in size.  No adenopathy. Electronically Signed   By: Jun 23, 2019 III M.D.   On: 06/23/2019 12:34     PHYSICAL EXAM    General - Well nourished, well developed, in no apparent distress.  Ophthalmologic - fundi not visualized due to noncooperation.  Cardiovascular - irregularly irregular heart rate and rhythm    Mental Status -  Level of arousal and orientation to time, place, and person were intact. Language including expression, naming, repetition, comprehension was assessed and found  intact.  Cranial Nerves II - XII - II - Visual field intact OU. III, IV, VI - Extraocular movements intact. V - Facial sensation intact bilaterally. VII - Facial movement intact bilaterally. VIII - Hearing & vestibular intact bilaterally. X - Palate elevates symmetrically. XI - Chin turning & shoulder shrug intact bilaterally. XII - Tongue protrusion intact.  Motor Strength - The patient's strength was normal in all extremities and pronator drift was absent.  Bulk was normal and fasciculations were absent.   Motor Tone - Muscle tone was assessed at the neck and appendages and was normal.  Diminished fine finger movements on the right.  Orbits left over right upper extremity.  Mild weakness of right grip  Reflexes - The patient's reflexes were symmetrical in all extremities and he had no pathological reflexes.  Sensory - Light touch, temperature/pinprick were assessed and were symmetrical.    Coordination - The patient had normal movements in the hands and feet with no ataxia or dysmetria.  Tremor was absent.  Gait and Station - deferred.   ASSESSMENT/PLAN Mr. Blake Carey is a 64 y.o. male with history of HTN and a-fib, on Eliquis, who presents via EMS as a Code Stroke after acute onset of right hemiplegia,  aphasia and unable to follow instructions. while eating at a restaurant this evening. Time of onset is the same as LKN: 1845. He did not receive IV t-PA due to anticoagulation therapy. IR - left M1 occlusion with single pass of aspiration thrombectomy restoring complete flow through the M1.  Stroke: Scattered punctate infarcts left MCA territory due to left M1 occlusion s/p IR with TICI3 reperfusion, embolic due to A. fib even on Eliquis. Large vessel disease also possible given CTA/MRA findings  CT Head - Normal for age non contrast CT appearance of the brain. ASPECTS 10.   CTA H&N - Positive for Left MCA M1 large vessel occlusion. Additionally, the study is positive for: -  Severe stenosis of the Dominant Left Vertebral Artery V4 segment, - Moderate distal Basilar Artery stenosis, - severe Right PCA P2 stenosis, - mild to moderate Right MCA branch irregularity.  MRI head - Few, tiny acute infarcts in the left MCA distribution, primarily at the basal ganglia. There is a punctate acute cortical infarct in the high right frontal lobe.  MRA head - treated left M1 segment remains patent with irregularity from atherosclerosis or treated thrombus. The upper division left M2 branch is severely stenotic with focal appearance likely reflecting atherosclerosis. high-grade right supraclinoid ICA stenosis. tandem high-grade P2 and P3 segment narrowings  2D Echo - 04/01/19 - EF 60 - 65%. No cardiac source of emboli identified.   Sars Corona Virus 2 - negative  LDL - 143  HgbA1c - 6.0  UDS - negative  VTE prophylaxis - heparin IV  Eliquis (apixaban) daily prior to admission, now on heparin IV. Plan for coumadin with INR goal 2-3 (family not able to afford $500 / month eliquis anymore). 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  MRA head confirmed the finding  Contacted Dr. Sherlon Handing Monday a.m. for further management (Dr. Loreta Ave has informed Dr. Sherlon Handing)  On heparin IV  Not put on coumadin yet, given planned coiling this admission   Angio/coiling canceled today by Dr. Sherlon Handing given increased WBC  Resume diet  Afib with bradycardia  HR 40s  Follows with Dr. Tawanna Cooler Cardiology in [place]  On Eliquis PTA  Now on heparin IV  Due to cost, will switch to Coumadin with INR 2-3 for long term AC  Close monitor heart rate  Did not resume home Coreg  Hypertension  Home BP meds: amlodipine ; HCTZ ; Zestril ; Coreg  Off cleviprex  Stable . Long-term BP goal  normotensive  Hyperlipidemia  Home Lipid lowering medication: none  LDL 143, goal < 70  Current lipid lowering medication: Lipitor 40  Continue statin at discharge  Other Stroke Risk Factors  Advanced age  Obesity, recommend weight loss, diet and exercise as appropriate   Other Active Problems  Leukocytosis - 18.9->14.5->16.5->28.3 (afebrile). UA and CXR 2 days ago were both ok. Will repeat 2V CXR, UA and Cx. Repeat labs in am.   AKI - creatinine - 1.19 - resolved  Hypokalemia - 3.4 - supplement  Hospital day # 3  Plan check UA and chest x-ray and repeat labs tomorrow morning.  Continue ongoing treatment.  Neuro interventional procedure for angiogram was postponed till labs are improved.  Discussed with neuro IR team, patient, wife and daughter and answered questions.  Greater than 50%  time during this 25-minute visit was spent on counseling and coordination of care and discussion with care team Antony Contras, MD To contact Stroke Continuity provider, please refer to [email or web address] After hours, contact General Neurology

## 2019-06-24 ENCOUNTER — Encounter (HOSPITAL_COMMUNITY)
Admission: EM | Disposition: A | Discharge status: Home / Self Care | Payer: Self-pay | Source: Home / Self Care | Attending: Neurology

## 2019-06-24 ENCOUNTER — Other Ambulatory Visit (HOSPITAL_COMMUNITY): Payer: Self-pay | Admitting: Radiology

## 2019-06-24 ENCOUNTER — Other Ambulatory Visit (HOSPITAL_COMMUNITY): Payer: Self-pay

## 2019-06-24 DIAGNOSIS — I10 Essential (primary) hypertension: Secondary | ICD-10-CM

## 2019-06-24 DIAGNOSIS — I671 Cerebral aneurysm, nonruptured: Secondary | ICD-10-CM | POA: Diagnosis present

## 2019-06-24 DIAGNOSIS — I480 Paroxysmal atrial fibrillation: Secondary | ICD-10-CM

## 2019-06-24 DIAGNOSIS — N179 Acute kidney failure, unspecified: Secondary | ICD-10-CM | POA: Diagnosis not present

## 2019-06-24 DIAGNOSIS — N390 Urinary tract infection, site not specified: Secondary | ICD-10-CM | POA: Diagnosis not present

## 2019-06-24 DIAGNOSIS — D72829 Elevated white blood cell count, unspecified: Secondary | ICD-10-CM

## 2019-06-24 DIAGNOSIS — E785 Hyperlipidemia, unspecified: Secondary | ICD-10-CM

## 2019-06-24 HISTORY — DX: Cerebral aneurysm, nonruptured: I67.1

## 2019-06-24 HISTORY — DX: Urinary tract infection, site not specified: N39.0

## 2019-06-24 HISTORY — DX: Elevated white blood cell count, unspecified: D72.829

## 2019-06-24 LAB — COMPREHENSIVE METABOLIC PANEL
ALT: 31 U/L (ref 0–44)
AST: 30 U/L (ref 15–41)
Albumin: 2.4 g/dL — ABNORMAL LOW (ref 3.5–5.0)
Alkaline Phosphatase: 64 U/L (ref 38–126)
Anion gap: 10 (ref 5–15)
BUN: 33 mg/dL — ABNORMAL HIGH (ref 8–23)
CO2: 19 mmol/L — ABNORMAL LOW (ref 22–32)
Calcium: 7.9 mg/dL — ABNORMAL LOW (ref 8.9–10.3)
Chloride: 106 mmol/L (ref 98–111)
Creatinine, Ser: 3.02 mg/dL — ABNORMAL HIGH (ref 0.61–1.24)
GFR calc Af Amer: 24 mL/min — ABNORMAL LOW (ref >60)
GFR calc non Af Amer: 21 mL/min — ABNORMAL LOW (ref >60)
Glucose, Bld: 96 mg/dL (ref 70–99)
Potassium: 3.6 mmol/L (ref 3.5–5.1)
Sodium: 135 mmol/L (ref 135–145)
Total Bilirubin: 1 mg/dL (ref 0.3–1.2)
Total Protein: 5.5 g/dL — ABNORMAL LOW (ref 6.5–8.1)

## 2019-06-24 LAB — CBC WITH DIFFERENTIAL/PLATELET
Abs Immature Granulocytes: 1.98 10*3/uL — ABNORMAL HIGH (ref 0.00–0.07)
Basophils Absolute: 0.1 10*3/uL (ref 0.0–0.1)
Basophils Relative: 0 %
Eosinophils Absolute: 0 10*3/uL (ref 0.0–0.5)
Eosinophils Relative: 0 %
HCT: 41.9 % (ref 39.0–52.0)
Hemoglobin: 13.4 g/dL (ref 13.0–17.0)
Immature Granulocytes: 6 %
Lymphocytes Relative: 4 %
Lymphs Abs: 1.2 10*3/uL (ref 0.7–4.0)
MCH: 26.2 pg (ref 26.0–34.0)
MCHC: 32 g/dL (ref 30.0–36.0)
MCV: 82 fL (ref 80.0–100.0)
Monocytes Absolute: 2.2 10*3/uL — ABNORMAL HIGH (ref 0.1–1.0)
Monocytes Relative: 7 %
Neutro Abs: 28.4 10*3/uL — ABNORMAL HIGH (ref 1.7–7.7)
Neutrophils Relative %: 83 %
Platelets: 170 10*3/uL (ref 150–400)
RBC: 5.11 MIL/uL (ref 4.22–5.81)
RDW: 16.3 % — ABNORMAL HIGH (ref 11.5–15.5)
WBC: 33.9 10*3/uL — ABNORMAL HIGH (ref 4.0–10.5)
nRBC: 0 % (ref 0.0–0.2)

## 2019-06-24 LAB — GLUCOSE, CAPILLARY
Glucose-Capillary: 116 mg/dL — ABNORMAL HIGH (ref 70–99)
Glucose-Capillary: 114 mg/dL — ABNORMAL HIGH (ref 70–99)
Glucose-Capillary: 121 mg/dL — ABNORMAL HIGH (ref 70–99)
Glucose-Capillary: 121 mg/dL — ABNORMAL HIGH (ref 70–99)
Glucose-Capillary: 94 mg/dL (ref 70–99)

## 2019-06-24 LAB — PATHOLOGIST SMEAR REVIEW

## 2019-06-24 LAB — PROTIME-INR
INR: 1.3 — ABNORMAL HIGH (ref 0.8–1.2)
Prothrombin Time: 16 seconds — ABNORMAL HIGH (ref 11.4–15.2)

## 2019-06-24 LAB — HEPARIN LEVEL (UNFRACTIONATED): Heparin Unfractionated: 0.47 IU/mL (ref 0.30–0.70)

## 2019-06-24 SURGERY — IR WITH ANESTHESIA
Anesthesia: General

## 2019-06-24 NOTE — Progress Notes (Signed)
Physical Therapy Treatment Patient Details Name: Blake Carey MRN: [id number] DOB: 1955/10/31 Today's Date: 06/24/2019    History of Present Illness 64 y.o male admitted for stroke work up after R hemiplegia and aphasia. Imaging positive for Left MCA M1 large vessel occlusion. PMH includes HTN and a-fib, on Eliquis    PT Comments    Patient seen for PT treatment. Pt eager to mobilize despite report of feeling more tired today compared to yesterday. Pt continues to urinate frequently and stopped twice to urinate during session. Pt continues to demonstrate mild gait deviations and overall supervision/min guard for ambulation. Current plan remains appropriate.   Follow Up Recommendations  Outpatient PT     Equipment Recommendations  None recommended by PT    Recommendations for Other Services       Precautions / Restrictions Precautions Precautions: Fall Restrictions Weight Bearing Restrictions: No(Simultaneous filing. User may not have seen previous data.)    Mobility  Bed Mobility Overal bed mobility: Modified Independent Bed Mobility: Supine to Sit;Sit to Supine              Transfers Overall transfer level: Modified independent Equipment used: None Transfers: Sit to/from Stand              Ambulation/Gait Ambulation/Gait assistance: Min Gaffer (Feet): 250 Feet(with 2 breaks to urinate) Assistive device: None Gait Pattern/deviations: Step-through pattern;Drifts right/left     General Gait Details: decreased cadence and mild drifting noted; increased gait deviations with L horizontal head turns   Marine scientist Rankin (Stroke Patients Only) Modified Rankin (Stroke Patients Only) Pre-Morbid Rankin Score: No symptoms Modified Rankin: Slight disability     Balance Overall balance assessment: Needs assistance Sitting-balance support: No upper extremity supported;Feet  supported Sitting balance-Leahy Scale: Good     Standing balance support: No upper extremity supported;During functional activity Standing balance-Leahy Scale: Fair                              Cognition Arousal/Alertness: Awake/alert Behavior During Therapy: WFL for tasks assessed/performed Overall Cognitive Status: Within Functional Limits for tasks assessed                                        Exercises      General Comments        Pertinent Vitals/Pain Pain Assessment: No/denies pain    Home Living                      Prior Function            PT Goals (current goals can now be found in the care plan section) Progress towards PT goals: Progressing toward goals    Frequency    Min 4X/week      PT Plan Current plan remains appropriate    Co-evaluation              AM-PAC PT "6 Clicks" Mobility   Outcome Measure  Help needed turning from your back to your side while in a flat bed without using bedrails?: None Help needed moving from lying on your back to sitting on the side of a flat bed without using bedrails?: None Help needed moving to and from a bed to a  chair (including a wheelchair)?: None Help needed standing up from a chair using your arms (e.g., wheelchair or bedside chair)?: None Help needed to walk in hospital room?: None Help needed climbing 3-5 steps with a railing? : A Little 6 Click Score: 23    End of Session   Activity Tolerance: Patient tolerated treatment well Patient left: with call bell/phone within reach;with family/visitor present;in bed;with bed alarm set Nurse Communication: Mobility status PT Visit Diagnosis: Other abnormalities of gait and mobility (R26.89);Other symptoms and signs involving the nervous system (R29.898)     Time: 5465-0354 PT Time Calculation (min) (ACUTE ONLY): 27 min  Charges:  $Gait Training: 23-37 mins                     Erline Levine, PTA Acute  Rehabilitation Services Pager: [phone] Office: [phone]     Carolynne Edouard 06/24/2019, 4:27 PM

## 2019-06-24 NOTE — Progress Notes (Signed)
STROKE TEAM PROGRESS NOTE   INTERVAL HISTORY WBC increased over night further along with worsening renal function. Etiology unclear.   Neurologically patient is unchanged.  He has no complaints or no new deficits.  His wife and daughter are at the bedside.  Vital signs are stable except mild hypotension.  OBJECTIVE Vitals:   06/23/19 2356 06/24/19 0400 06/24/19 0728 06/24/19 1147  BP: (!) 73/47 92/63 91/61  92/78  Pulse: 100 86 89 87  Resp: 20 18 18 18   Temp: 98.3 F (36.8 C) 97.7 F (36.5 C) 98 F (36.7 C) 98.1 F (36.7 C)  TempSrc: Oral Oral Oral Oral  SpO2: 96% 96% 97% 98%   CBC:  Recent Labs  Lab 06/23/19 0647 06/24/19 0353  WBC 28.3* 33.9*  NEUTROABS 24.8* 28.4*  HGB 14.6 13.4  HCT 45.1 41.9  MCV 81.7 82.0  PLT 195 295   Basic Metabolic Panel:  Recent Labs  Lab 06/23/19 0647 06/24/19 0353  NA 136 135  K 3.4* 3.6  CL 104 106  CO2 20* 19*  GLUCOSE 100* 96  BUN 15 33*  CREATININE 1.19 3.02*  CALCIUM 8.5* 7.9*    IMAGING past 24h No results found.   PHYSICAL EXAM    General - Well nourished, well developed, in no apparent distress.  Ophthalmologic - fundi not visualized due to noncooperation.  Cardiovascular - irregularly irregular heart rate and rhythm    Mental Status -  Level of arousal and orientation to time, place, and person were intact. Language including expression, naming, repetition, comprehension was assessed and found intact.  Cranial Nerves II - XII - II - Visual field intact OU. III, IV, VI - Extraocular movements intact. V - Facial sensation intact bilaterally. VII - Facial movement intact bilaterally. VIII - Hearing & vestibular intact bilaterally. X - Palate elevates symmetrically. XI - Chin turning & shoulder shrug intact bilaterally. XII - Tongue protrusion intact.  Motor Strength - The patient's strength was normal in all extremities and pronator drift was absent.  Bulk was normal and fasciculations were absent.   Motor Tone  - Muscle tone was assessed at the neck and appendages and was normal.  Diminished fine finger movements on the right.  Orbits left over right upper extremity.  Mild weakness of right grip  Reflexes - The patient's reflexes were symmetrical in all extremities and he had no pathological reflexes.  Sensory - Light touch, temperature/pinprick were assessed and were symmetrical.    Coordination - The patient had normal movements in the hands and feet with no ataxia or dysmetria.  Tremor was absent.  Gait and Station - deferred.   ASSESSMENT/PLAN Mr. Blake Carey is a 64 y.o. male with history of HTN and a-fib, on Eliquis, who presents via EMS as a Code Stroke after acute onset of right hemiplegia, aphasia and unable to follow instructions. while eating at a restaurant this evening. Time of onset is the same as LKN: 6213. He did not receive IV t-PA due to anticoagulation therapy. IR - left M1 occlusion with single pass of aspiration thrombectomy restoring complete flow through the M1.  Stroke: Scattered punctate infarcts left MCA territory due to left M1 occlusion s/p IR with TICI3 reperfusion, embolic due to A. fib even on Eliquis. Large vessel disease also possible given CTA/MRA findings  CT Head - Normal for age non contrast CT appearance of the brain. ASPECTS 10.   CTA H&N - Positive for Left MCA M1 large vessel occlusion. Additionally, the study is positive  for: - Severe stenosis of the Dominant Left Vertebral Artery V4 segment, - Moderate distal Basilar Artery stenosis, - severe Right PCA P2 stenosis, - mild to moderate Right MCA branch irregularity.  MRI head - Few, tiny acute infarcts in the left MCA distribution, primarily at the basal ganglia. There is a punctate acute cortical infarct in the high right frontal lobe.  MRA head - treated left M1 segment remains patent with irregularity from atherosclerosis or treated thrombus. The upper division left M2 branch is severely stenotic with  focal appearance likely reflecting atherosclerosis. high-grade right supraclinoid ICA stenosis. tandem high-grade P2 and P3 segment narrowings  2D Echo - 04/01/19 - EF 60 - 65%. No cardiac source of emboli identified.   Sars Corona Virus 2 - negative  LDL - 143  HgbA1c - 6.0  UDS - negative  VTE prophylaxis - heparin IV  Eliquis (apixaban) daily prior to admission, now on heparin IV. Plan for coumadin with INR goal 2-3 (family not able to afford $500 / month eliquis anymore). 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  MRA head confirmed the finding  Contacted Dr. Sherlon Handing Monday a.m. for further management (Dr. Loreta Ave has informed Dr. Sherlon Handing)  On heparin IV  Not put on coumadin yet, given planned coiling this admission   Angio/coiling canceled today by Dr. Sherlon Handing given increased WBC  Resume diet  Afib with bradycardia  HR 40s  Follows with Dr. Tawanna Cooler [facility] in [place]  On Eliquis PTA  Now on heparin IV  Due to cost, will switch to Coumadin with INR 2-3 for long term AC  Close monitor heart rate  Did not resume home Coreg  Hypertension  Home BP meds: amlodipine ; HCTZ ; Zestril ; Coreg  Off cleviprex  Stable . Long-term BP goal normotensive  Hyperlipidemia  Home Lipid lowering medication: none  LDL 143, goal < 70  Current lipid lowering medication: Lipitor 40  Continue statin at discharge  Other Stroke Risk Factors  Advanced age  Obesity, recommend weight loss, diet and exercise as appropriate   Other Active Problems  Leukocytosis - 18.9->14.5->16.5->28.3 (afebrile). UA and CXR 2 days ago were both ok. Will repeat 2V CXR, UA and Cx. Repeat labs in am.   AKI - creatinine - 1.19 - resolved  Hypokalemia - 3.4 - supplement  Hospital day #  4  Plan continue Rocephin for UTI and IV hydration for worsening renal function.  Consult medical hospital list team to help manage rising WBC and worsening renal function.  Continue ongoing treatment.  Neuro interventional procedure for angiogram will remain postponed till labs are improved.  Discussed with neuro IR team, Dr. Ophelia Charter medical hospitalist, patient, wife and daughter and answered questions.  Greater than 50% time during this 25-minute visit was spent on counseling and coordination of care and discussion with care team Delia Heady, MD To contact Stroke Continuity provider, please refer to [email or web address]. After hours, contact General Neurology

## 2019-06-24 NOTE — Consult Note (Signed)
Medical Consultation   [place]  OZH:[id number]  DOB: Oct 15, 1955  DOA: 06/20/2019  PCP: Dema Severin, NP   Outpatient Specialists: Servando Salina - cardiology    Requesting physician: Pearlean Brownie, neurology  Reason for consultation: Doing well after mechanical thrombectomy.  Found to have an intracranial aneurysm, waiting for procedure.  WBC 24 yesterday, ?UTI, cultures pending.  Given Rocephin yesterday.  WBC 38 today and AKI today - ?delayed contrast-induced nephrectomy.  He looks well, no deficits.   History of Present Illness: Blake Carey is an 64 y.o. male with h/o HTN and afib on Eliquis who presented on 5/22 with embolic CVA treated with IR reperfusion.   Yesterday, he had all the urinary symptoms and fever but all symptoms have resolved and he feels well today.  He is pleasant and conversant (in Spanish) with no complaints today.    Review of Systems:  ROS As per HPI otherwise 10 point review of systems negative.    Past Medical History: Past Medical History:  Diagnosis Date  . Atrial fibrillation (HCC)   . HTN (hypertension)     Past Surgical History: Past Surgical History:  Procedure Laterality Date  . APPENDECTOMY    . IR CT HEAD LTD  06/20/2019  . IR PERCUTANEOUS ART THROMBECTOMY/INFUSION INTRACRANIAL INC DIAG ANGIO  06/20/2019  . IR US GUIDE VASC ACCESS RIGHT  06/20/2019  . KNEE SURGERY Right   . RADIOLOGY WITH ANESTHESIA N/A 06/20/2019   Procedure: IR WITH ANESTHESIA;  Surgeon: Julieanne Cotton, MD;  Location: [facility] OR;  Service: Radiology;  Laterality: N/A;     Allergies:  No Known Allergies   Social History:  reports that he has never smoked. He has never used smokeless tobacco. He reports that he does not drink alcohol or use drugs.   Family History: Family History  Problem Relation Age of Onset  . Prostate cancer Father       Physical Exam: Vitals:   06/23/19 2356 06/24/19 0400 06/24/19 0728 06/24/19 1147  BP: (!) 73/47 92/63  91/61 92/78  Pulse: 100 86 89 87  Resp: 20 18 18 18   Temp: 98.3 F (36.8 C) 97.7 F (36.5 C) 98 F (36.7 C) 98.1 F (36.7 C)  TempSrc: Oral Oral Oral Oral  SpO2: 96% 96% 97% 98%    Constitutional: Alert and awake, oriented x3, not in any acute distress. Eyes:  EOMI, irises appear normal, anicteric sclera,  ENMT: external ears and nose appear normal, normal hearing, Lips appear normal, oropharynx mucosa, tongue appear normal  Neck: neck appears normal, no masses, normal ROM, no thyromegaly, no JVD  CVS: S1-S2 clear, no murmur rubs or gallops, no LE edema, normal pedal pulses  Respiratory:  clear to auscultation bilaterally, no wheezing, rales or rhonchi. Respiratory effort normal. No accessory muscle use.  Abdomen: soft nontender, nondistended  Musculoskeletal: : no cyanosis, clubbing or edema noted bilaterally Neuro: Cranial nerves II-XII intact, strength, sensation, reflexes Psych: judgement and insight appear normal, stable mood and affect, mental status Skin: no rashes or lesions or ulcers, no induration or nodules    Data reviewed:  I have personally reviewed the recent labs and imaging studies  Pertinent Labs:   CO2 19 BUN 33/Creatinine 3.02/GFR 21; 15/1.19/>60 on 5/25 Albumin 2.4 WBC 33.9; 28.3 on 5/25; 18.9 on 5/22; 12.8 on 5/14; 6.0 on 2/8 INR 1.3   Inpatient Medications:   Scheduled Meds: . atorvastatin  40 mg Oral  q1800  . Chlorhexidine Gluconate Cloth  6 each Topical Q0600  . insulin aspart  0-9 Units Subcutaneous TID WC  . pantoprazole  40 mg Oral Daily  . sodium chloride flush  3 mL Intravenous Once   Continuous Infusions: . sodium chloride 50 mL/hr at 06/24/19 0457  . cefTRIAXone (ROCEPHIN)  IV 2 g (06/23/19 1750)  . heparin 800 Units/hr (06/24/19 0730)     Radiological Exams on Admission: DG Chest Port 1 View  Result Date: 06/23/2019 CLINICAL DATA:  Leukocytosis EXAM: PORTABLE CHEST 1 VIEW COMPARISON:  Jun 21, 2019 FINDINGS: Lungs are clear. Heart  is upper normal in size with pulmonary vascularity normal. No adenopathy. No bone lesions. IMPRESSION: Lungs clear.  Heart upper normal in size.  No adenopathy. Electronically Signed   By: Lowella Grip III M.D.   On: 06/23/2019 12:34    Impression/Recommendations Principal Problem:   Stroke (cerebrum) (HCC) Active Problems:   Essential hypertension   Dyslipidemia   Paroxysmal atrial fibrillation (HCC)   Obesity (BMI 30-39.9)   Leukocytosis   AKI (acute kidney injury) ([place])   Aneurysm of anterior communicating artery   Acute lower UTI  CVA -Patient was initially admitted due to multiple tiny acute infarcts in the L MCA distribution -He was treated with thrombectomy and has had resolution of his deficits -He is feeling well and would be ready for d/c if not for the aneurysm that was also seen on MRI -He has since developed persistent and worsening leukocytosis as well as AKI  UTI -Patient with urinary symptoms yesterday which were c/w UTI -Symptoms have resolved -UA yesterday (5/25) with large LE and many bacteria as well as >50 WBC, previously no LE on 5/23 -Urine culture is pending from 5/25 but is growing >100k colonies of GNR -Agree with Rocephin for 3 (uncomplicated) to 7 (complicated) days  Leukocytosis -Likely associated with UTI -Would recommend ongoing monitoring without further intervention at this time -Anticipate improvement - patient reports feeling well with resolution of UTI symptoms  AKI -Likely a combination of infection (UTI) and contrast-induced nephropathy -Would continue to hold on aneurysmal repair and then pre-treat with IVF to prevent recurrent injury -Will continue to hydrate - increase IVF to 75 cc/hr (plus diet) -Will follow through improvement/resolution  Anterior communicating artery aneurysm -He is scheduled for arteriogram with possible embolization -Would recommend ongoing hold until WBC is improving and AKI is resolved -Will need  aggressive hydration to avoid recurrent AKI  HTN -Home BP meds have not been resumed - Amlodipine, HCTZ, Lisinopril, Coreg -He is currently only on prn IV hydralazine for BP control   HLD -LDL 143 -He has been started on Lipitor 40 mg daily   Afib -Rate controlled with Coreg -Previously on Eliquis but he has embolic CVA on Eliquis and so has failed this medication -Plan to start Coumadin once aneurysm is repaired - we discussed this medication at length today and they are requesting educational materials particularly regarding dietary issues -Heparin for now    Thank you for this consultation.  Our [facility] hospitalist team will follow the patient with you.   Time Spent: 50 minutes  Karmen Bongo M.D. [facility] 06/24/2019, 2:43 PM

## 2019-06-24 NOTE — Progress Notes (Signed)
SLP Cancellation Note  Patient Details Name: Keland Peyton MRN: [id number] DOB: March 12, 1955   Cancelled treatment:       Reason Eval/Treat Not Completed: Patient at procedure or test/unavailable. Possible procedure today, pt NPO   Donis Kotowski, Riley Nearing 06/24/2019, 9:20 AM

## 2019-06-24 NOTE — Progress Notes (Signed)
NIR Brief Note:  Patient was scheduled for an image-guided cerebral arteriogram with possible embolization of ACOM aneurysm today with Dr. Tommie Sams.  CBC with differential today revealed WBCs elevated to 33.9 (up from 28.3 yesterday), with absolute neutrophils elevated to 28.4 (up from 24.8 yesterday). Discussed case with Dr. Tommie Sams who recommends again postponing procedure until WBC elevation improves. Discussed with neurology who is consulting [facility] for further assistance as well. Will restart diet today. Continue IV Rocephin for UTI.  Discussed above with patient, patient's wife, and patient's daughter at bedside. Tele-interpretor [phone] present throughout today's discussion. On physical exam, patient is neurologically intact, no change. Per wife, last evening patient was experiencing fever and chills despite IV antibiotic use. Patient still with frequent urination and dysuria. All questions answered and concerns addressed.   NIR to follow-up following [facility] consult to determine future timing of procedure (will not be today). Further plans per neurology/TRH- appreciate and agree with management. NIR to follow.   Blake Boga Sion Thane, PA-C 06/24/2019, 9:59 AM

## 2019-06-24 NOTE — Progress Notes (Signed)
ANTICOAGULATION CONSULT NOTE  Pharmacy Consult for Heparin  Indication: stroke  No Known Allergies  Patient Measurements:   Actual body weight: 76.2 kg Ideal body weight: 56.9 kg (125 lb 7.1 oz) Adjusted ideal body weight: 64.6 kg (142 lb 7.4 oz)   Heparin Dosing Weight: 72.5 kg  Vital Signs: Temp: 98 F (36.7 C) (05/26 0728) Temp Source: Oral (05/26 0728) BP: 91/61 (05/26 0728) Pulse Rate: 89 (05/26 0728)  Labs: Recent Labs    06/21/19 1027 06/21/19 2101 06/22/19 0509 06/22/19 0509 06/23/19 0647 06/24/19 0353  HGB   < >  --  13.1   < > 14.6 13.4  HCT   < >  --  41.6  --  45.1 41.9  PLT   < >  --  183  --  195 170  APTT  --  69* 75*  --   --   --   LABPROT  --   --  15.2  --  16.1* 16.0*  INR  --   --  1.3*  --  1.3* 1.3*  HEPARINUNFRC   < >  --  0.55  --  0.39 0.47  CREATININE   < >  --  1.01  --  1.19 3.02*   < > = values in this interval not displayed.    Estimated Creatinine Clearance: 22 mL/min (A) (by C-G formula based on SCr of 3.02 mg/dL (H)).   Medical History: Past Medical History:  Diagnosis Date  . Atrial fibrillation (HCC)   . HTN (hypertension)     Assessment: 42 YOM who presented on 5/22 w/ R-hemiplegia and aphasia and found to have an acute stroke. The patient was on Apixaban PTA for hx Afib. Given the new stroke - pharmacy has been consulted to transition to warfarin with a heparin bridge.  Warfarin currently on hold due to possible procedure  Heparin level therapeutic this AM, CBC stable  Worsening renal function - > Scr up to 3  Goal of Therapy:  Heparin level 0.3-0.5 Monitor platelets by anticoagulation protocol: Yes   Plan:  Continue heparin at 800 units / hr Follow up AM labs  Warfarin on hold pending coiling procedure  Thank you Okey Regal, PhamD [phone] 06/24/2019 8:54 AM   **Pharmacist phone directory can now be found on [email or web address] ([email or web address]).  Listed under [facility].

## 2019-06-25 ENCOUNTER — Encounter (HOSPITAL_COMMUNITY): Payer: Self-pay | Admitting: Certified Registered"

## 2019-06-25 ENCOUNTER — Inpatient Hospital Stay (HOSPITAL_COMMUNITY): Payer: Self-pay

## 2019-06-25 ENCOUNTER — Other Ambulatory Visit (HOSPITAL_COMMUNITY): Payer: Self-pay

## 2019-06-25 LAB — GLUCOSE, CAPILLARY
Glucose-Capillary: 98 mg/dL (ref 70–99)
Glucose-Capillary: 106 mg/dL — ABNORMAL HIGH (ref 70–99)
Glucose-Capillary: 108 mg/dL — ABNORMAL HIGH (ref 70–99)
Glucose-Capillary: 94 mg/dL (ref 70–99)

## 2019-06-25 LAB — CBC WITH DIFFERENTIAL/PLATELET
Abs Immature Granulocytes: 0.19 10*3/uL — ABNORMAL HIGH (ref 0.00–0.07)
Basophils Absolute: 0 10*3/uL (ref 0.0–0.1)
Basophils Relative: 0 %
Eosinophils Absolute: 0.2 10*3/uL (ref 0.0–0.5)
Eosinophils Relative: 1 %
HCT: 37.7 % — ABNORMAL LOW (ref 39.0–52.0)
Hemoglobin: 12.3 g/dL — ABNORMAL LOW (ref 13.0–17.0)
Immature Granulocytes: 1 %
Lymphocytes Relative: 6 %
Lymphs Abs: 1 10*3/uL (ref 0.7–4.0)
MCH: 26.5 pg (ref 26.0–34.0)
MCHC: 32.6 g/dL (ref 30.0–36.0)
MCV: 81.1 fL (ref 80.0–100.0)
Monocytes Absolute: 1.2 10*3/uL — ABNORMAL HIGH (ref 0.1–1.0)
Monocytes Relative: 7 %
Neutro Abs: 14.2 10*3/uL — ABNORMAL HIGH (ref 1.7–7.7)
Neutrophils Relative %: 85 %
Platelets: 151 10*3/uL (ref 150–400)
RBC: 4.65 MIL/uL (ref 4.22–5.81)
RDW: 16.6 % — ABNORMAL HIGH (ref 11.5–15.5)
WBC: 16.9 10*3/uL — ABNORMAL HIGH (ref 4.0–10.5)
nRBC: 0 % (ref 0.0–0.2)

## 2019-06-25 LAB — HEPARIN LEVEL (UNFRACTIONATED)
Heparin Unfractionated: 0.29 IU/mL — ABNORMAL LOW (ref 0.30–0.70)
Heparin Unfractionated: 0.25 IU/mL — ABNORMAL LOW (ref 0.30–0.70)
Heparin Unfractionated: 0.24 IU/mL — ABNORMAL LOW (ref 0.30–0.70)

## 2019-06-25 LAB — BASIC METABOLIC PANEL
Anion gap: 9 (ref 5–15)
BUN: 47 mg/dL — ABNORMAL HIGH (ref 8–23)
CO2: 21 mmol/L — ABNORMAL LOW (ref 22–32)
Calcium: 7.8 mg/dL — ABNORMAL LOW (ref 8.9–10.3)
Chloride: 104 mmol/L (ref 98–111)
Creatinine, Ser: 3.4 mg/dL — ABNORMAL HIGH (ref 0.61–1.24)
GFR calc Af Amer: 21 mL/min — ABNORMAL LOW (ref >60)
GFR calc non Af Amer: 18 mL/min — ABNORMAL LOW (ref >60)
Glucose, Bld: 96 mg/dL (ref 70–99)
Potassium: 3.7 mmol/L (ref 3.5–5.1)
Sodium: 134 mmol/L — ABNORMAL LOW (ref 135–145)

## 2019-06-25 LAB — PROTIME-INR
INR: 1.4 — ABNORMAL HIGH (ref 0.8–1.2)
Prothrombin Time: 16.2 seconds — ABNORMAL HIGH (ref 11.4–15.2)

## 2019-06-25 LAB — URINE CULTURE: Culture: >=100000 — AB

## 2019-06-25 MED ORDER — SIMETHICONE 80 MG PO CHEW
80.0000 mg | CHEWABLE_TABLET | Freq: Four times a day (QID) | ORAL | Status: DC | PRN
  Administered 2019-06-25: 80 mg via ORAL
  Filled 2019-06-25: qty 1

## 2019-06-25 NOTE — Progress Notes (Addendum)
ANTICOAGULATION CONSULT NOTE  Pharmacy Consult for Heparin  Indication: stroke  No Known Allergies  Patient Measurements:   Actual body weight: 76.2 kg Ideal body weight: 56.9 kg (125 lb 7.1 oz) Adjusted ideal body weight: 64.6 kg (142 lb 7.4 oz)   Heparin Dosing Weight: 72.5 kg  Vital Signs: Temp: 98 F (36.7 C) (05/27 0300) Temp Source: Oral (05/27 0300) BP: 107/73 (05/27 0300) Pulse Rate: 81 (05/27 0300)  Labs: Recent Labs    06/22/19 0509 06/22/19 0509 06/23/19 0647 06/23/19 0647 06/24/19 0353 06/25/19 0220  HGB 13.1   < > 14.6   < > 13.4 12.3*  HCT 41.6   < > 45.1  --  41.9 37.7*  PLT 183   < > 195  --  170 151  APTT 75*  --   --   --   --   --   LABPROT 15.2   < > 16.1*  --  16.0* 16.2*  INR 1.3*   < > 1.3*  --  1.3* 1.4*  HEPARINUNFRC 0.55   < > 0.39  --  0.47 0.29*  CREATININE 1.01   < > 1.19  --  3.02* 3.40*   < > = values in this interval not displayed.    Estimated Creatinine Clearance: 19.5 mL/min (A) (by C-G formula based on SCr of 3.4 mg/dL (H)).  Assessment: 40 YOM who presented on 5/22 w/ R-hemiplegia and aphasia and found to have an acute stroke. The patient was on Apixaban PTA for hx Afib. Given the new stroke - pharmacy has been consulted to transition to warfarin with a heparin bridge.  Warfarin currently on hold due to possible procedure  Heparin level down to slightly subtherapeutic (0.29) on gtt at 800 units/hr. RN notes that pt having hard time keeping arm straight so there is a lot of beeping and pausing.  Goal of Therapy:  Heparin level 0.3-0.5 units/ml Monitor platelets by anticoagulation protocol: Yes   Plan:   Will continue heparin at 800 units/hr and recheck level in about 6 hours  Christoper Fabian, PharmD, BCPS Please see amion for complete clinical pharmacist phone list 06/25/2019 3:46 AM

## 2019-06-25 NOTE — Progress Notes (Signed)
Occupational Therapy Treatment Patient Details Name: Blake Carey MRN: [id number] DOB: 03/17/1955 Today's Date: 06/25/2019    History of present illness 64 y.o male admitted for stroke work up after R hemiplegia and aphasia. Imaging positive for Left MCA M1 large vessel occlusion. PMH includes HTN and a-fib, on Eliquis   OT comments  Patient continues to make steady progress towards goals in skilled OT session. Patient's session encompassed reiteration of RUE HEP as well as completion of ADLs to improve overall function. Pt minimally impulsive in movement when ambulating to the bathroom, requiring min A for safety. Education provided to family with regard to assist going home, ensuring that a family member will be with him when completing ADLs for safety purposes. Additional education provided to patient and family for RUE HEP with teach back noted. Discharge remains appropriate; will continue to follow acutely.    Follow Up Recommendations  Outpatient OT    Equipment Recommendations  None recommended by OT    Recommendations for Other Services      Precautions / Restrictions Precautions Precautions: Fall Restrictions Weight Bearing Restrictions: No       Mobility Bed Mobility Overal bed mobility: Modified Independent Bed Mobility: Supine to Sit;Sit to Supine           General bed mobility comments: supervision for safety  Transfers Overall transfer level: Modified independent Equipment used: None Transfers: Sit to/from Stand Sit to Stand: Min assist         General transfer comment: Min A solely for tactile cues to complete transfer safely    Balance Overall balance assessment: Needs assistance Sitting-balance support: No upper extremity supported;Feet supported Sitting balance-Leahy Scale: Good     Standing balance support: No upper extremity supported;During functional activity Standing balance-Leahy Scale: Fair Standing balance comment: Balance minimally  impaired upon initial sit<>stand, however did not require use of AD or environment to brace                           ADL either performed or assessed with clinical judgement   ADL Overall ADL's : Needs assistance/impaired     Grooming: Wash/dry hands;Standing;Supervision/safety           Upper Body Dressing : Supervision/safety;Sitting       Toilet Transfer: Minimal assistance;Ambulation Toilet Transfer Details (indicate cue type and reason): Min A due to impulsivity with urgency to uriniate Toileting- Clothing Manipulation and Hygiene: Sit to/from stand;Minimal assistance Toileting - Clothing Manipulation Details (indicate cue type and reason): clothing management in sitting and standing x2     Functional mobility during ADLs: Minimal assistance General ADL Comments: Pt continues to gain functional mobility with RUE, however remains impulsive in movement especially when needing to go to the bathroom     Vision       Perception     Praxis      Cognition Arousal/Alertness: Awake/alert Behavior During Therapy: WFL for tasks assessed/performed Overall Cognitive Status: Within Functional Limits for tasks assessed                                 General Comments: Educated family further on RUE HEP as well as increased safety with discharge home        Exercises Other Exercises Other Exercises: Provided further education to family for HEP, pt is an Personnel officer by trade so provided tangible examples to aid in increased dexterity  Shoulder Instructions       General Comments      Pertinent Vitals/ Pain       Pain Assessment: Faces Faces Pain Scale: Hurts a little bit Pain Location: Indigestion Pain Descriptors / Indicators: Aching;Discomfort Pain Intervention(s): Limited activity within patient's tolerance;Monitored during session;Repositioned;Patient requesting pain meds-RN notified  Home Living                                           Prior Functioning/Environment              Frequency  Min 2X/week        Progress Toward Goals  OT Goals(current goals can now be found in the care plan section)  Progress towards OT goals: Progressing toward goals  Acute Rehab OT Goals Patient Stated Goal: To return to baseline OT Goal Formulation: With patient Time For Goal Achievement: 07/05/19 Potential to Achieve Goals: Good  Plan Discharge plan remains appropriate    Co-evaluation                 AM-PAC OT "6 Clicks" Daily Activity     Outcome Measure   Help from another person eating meals?: None Help from another person taking care of personal grooming?: None Help from another person toileting, which includes using toliet, bedpan, or urinal?: A Little Help from another person bathing (including washing, rinsing, drying)?: A Little Help from another person to put on and taking off regular upper body clothing?: None Help from another person to put on and taking off regular lower body clothing?: A Little 6 Click Score: 21    End of Session Equipment Utilized During Treatment: Gait belt  OT Visit Diagnosis: Other abnormalities of gait and mobility (R26.89);Hemiplegia and hemiparesis;Other symptoms and signs involving the nervous system (R29.898) Hemiplegia - Right/Left: Right Hemiplegia - dominant/non-dominant: Dominant Hemiplegia - caused by: Cerebral infarction   Activity Tolerance Patient tolerated treatment well   Patient Left in bed;with call bell/phone within reach;with family/visitor present   Nurse Communication Mobility status;Other (comment)(Medication for indigestion)        Time: 2706-2376 OT Time Calculation (min): 14 min  Charges: OT General Charges $OT Visit: 1 Visit OT Treatments $Self Care/Home Management : 8-22 mins  Corinne Ports E. [place], [place] [facility] [place] 06/25/2019, 10:14 AM

## 2019-06-25 NOTE — Progress Notes (Signed)
ANTICOAGULATION CONSULT NOTE  Pharmacy Consult for Heparin  Indication: stroke  No Known Allergies  Patient Measurements:   Actual body weight: 76.2 kg Ideal body weight: 56.9 kg (125 lb 7.1 oz) Adjusted ideal body weight: 64.6 kg (142 lb 7.4 oz)   Heparin Dosing Weight: 72.5 kg  Vital Signs: Temp: 99.1 F (37.3 C) (05/27 1134) Temp Source: Oral (05/27 1134) BP: 143/78 (05/27 1134) Pulse Rate: 100 (05/27 1134)  Labs: Recent Labs    06/23/19 0647 06/23/19 0647 06/24/19 0353 06/25/19 0220 06/25/19 1025  HGB 14.6   < > 13.4 12.3*  --   HCT 45.1  --  41.9 37.7*  --   PLT 195  --  170 151  --   LABPROT 16.1*  --  16.0* 16.2*  --   INR 1.3*  --  1.3* 1.4*  --   HEPARINUNFRC 0.39   < > 0.47 0.29* 0.25*  CREATININE 1.19  --  3.02* 3.40*  --    < > = values in this interval not displayed.    Estimated Creatinine Clearance: 19.5 mL/min (A) (by C-G formula based on SCr of 3.4 mg/dL (H)).  Assessment: 33 YOM who presented on 5/22 w/ R-hemiplegia and aphasia and found to have an acute stroke. The patient was on Apixaban PTA for hx Afib. Given the new stroke - pharmacy has been consulted to transition to warfarin with a heparin bridge.  Warfarin currently on hold due to possible procedure  Heparin level still slightly subtherapeutic (0.25) on gtt at 800 units/hr. RN notes that pt having hard time keeping arm straight so there is a lot of beeping and pausing. H/H drop likely due to dilution, no bleeding noted.   Goal of Therapy:  Heparin level 0.3-0.5 units/ml Monitor platelets by anticoagulation protocol: Yes   Plan:  Increase heparin to 850 units/hr  F/u 6hr HL Monitor daily HL, CBC/plt Monitor for signs/symptoms of bleeding   Alphia Moh, PharmD, BCPS, BCCP Clinical Pharmacist  Please check AMION for all [facility] Pharmacy phone numbers After 10:00 PM, call Main Pharmacy [phone]

## 2019-06-25 NOTE — Progress Notes (Signed)
ANTICOAGULATION CONSULT NOTE  Pharmacy Consult for Heparin  Indication: stroke  No Known Allergies  Patient Measurements:   Actual body weight: 76.2 kg Ideal body weight: 56.9 kg (125 lb 7.1 oz) Adjusted ideal body weight: 64.6 kg (142 lb 7.4 oz)   Heparin Dosing Weight: 72.5 kg  Vital Signs: Temp: 98.4 F (36.9 C) (05/27 1931) Temp Source: Oral (05/27 1931) BP: 130/87 (05/27 1931) Pulse Rate: 90 (05/27 1931)  Labs: Recent Labs    06/23/19 0647 06/23/19 0454 06/24/19 0353 06/24/19 0353 06/25/19 0220 06/25/19 1025 06/25/19 2052  HGB 14.6   < > 13.4  --  12.3*  --   --   HCT 45.1  --  41.9  --  37.7*  --   --   PLT 195  --  170  --  151  --   --   LABPROT 16.1*  --  16.0*  --  16.2*  --   --   INR 1.3*  --  1.3*  --  1.4*  --   --   HEPARINUNFRC 0.39   < > 0.47   < > 0.29* 0.25* 0.24*  CREATININE 1.19  --  3.02*  --  3.40*  --   --    < > = values in this interval not displayed.    Estimated Creatinine Clearance: 19.5 mL/min (A) (by C-G formula based on SCr of 3.4 mg/dL (H)).  Assessment: 84 YOM who presented on 5/22 w/ R-hemiplegia and aphasia and found to have an acute stroke. The patient was on Apixaban PTA for hx Afib. Given the new stroke - pharmacy has been consulted to transition to warfarin with a heparin bridge.  Warfarin currently on hold due to possible procedure  Heparin level still slightly subtherapeutic (0.25) on gtt at 800 units/hr. RN notes that pt having hard time keeping arm straight so there is a lot of beeping and pausing. H/H drop likely due to dilution, no bleeding noted.   PM - heparin level slightly below goal.  Per RN, no issues with IV infusion.  No overt bleeding or complications noted.  Goal of Therapy:  Heparin level 0.3-0.5 units/ml Monitor platelets by anticoagulation protocol: Yes   Plan:  Increase heparin to 900 units/hr  Monitor daily HL, CBC/plt Monitor for signs/symptoms of bleeding   Jenetta Downer,  BCCP Clinical Pharmacist  06/25/2019 9:38 PM   [facility] pharmacy phone numbers are listed on [email or web address]   Please check AMION for all [facility] Pharmacy phone numbers After 10:00 PM, call Main Pharmacy [phone]

## 2019-06-25 NOTE — Progress Notes (Signed)
STROKE TEAM PROGRESS NOTE   INTERVAL HISTORY Patient is lying comfortably in bed.  His wife and daughter at the bedside.  He has no complaints.  He is white count has come down to 19,000 today but renal function has worsened to 3.2.  Urine cultures are pending he is on Rocephin day 3 today.  Neurologically patient is unchanged.  He has no complaints or no new deficits.     Vital signs are stable except mild hypotension.  OBJECTIVE Vitals:   06/25/19 0300 06/25/19 0719 06/25/19 0728 06/25/19 1134  BP: 107/73 (!) 147/80 (!) 147/80 (!) 143/78  Pulse: 81 (!) 114 98 100  Resp: 18 18 18 18   Temp: 98 F (36.7 C) 98.5 F (36.9 C) 98.5 F (36.9 C) 99.1 F (37.3 C)  TempSrc: Oral Oral Oral Oral  SpO2: 99% 99% 99% 99%   CBC:  Recent Labs  Lab 06/24/19 0353 06/25/19 0220  WBC 33.9* 16.9*  NEUTROABS 28.4* 14.2*  HGB 13.4 12.3*  HCT 41.9 37.7*  MCV 82.0 81.1  PLT 170 102   Basic Metabolic Panel:  Recent Labs  Lab 06/24/19 0353 06/25/19 0220  NA 135 134*  K 3.6 3.7  CL 106 104  CO2 19* 21*  GLUCOSE 96 96  BUN 33* 47*  CREATININE 3.02* 3.40*  CALCIUM 7.9* 7.8*    IMAGING past 24h No results found.   PHYSICAL EXAM    General - Well nourished, well developed, in no apparent distress.  Ophthalmologic - fundi not visualized due to noncooperation.  Cardiovascular - irregularly irregular heart rate and rhythm    Mental Status -  Level of arousal and orientation to time, place, and person were intact. Language including expression, naming, repetition, comprehension was assessed and found intact.  Cranial Nerves II - XII - II - Visual field intact OU. III, IV, VI - Extraocular movements intact. V - Facial sensation intact bilaterally. VII - Facial movement intact bilaterally. VIII - Hearing & vestibular intact bilaterally. X - Palate elevates symmetrically. XI - Chin turning & shoulder shrug intact bilaterally. XII - Tongue protrusion intact.  Motor Strength - The  patient's strength was normal in all extremities and pronator drift was absent.  Bulk was normal and fasciculations were absent.   Motor Tone - Muscle tone was assessed at the neck and appendages and was normal.  Diminished fine finger movements on the right.  Orbits left over right upper extremity.  Mild weakness of right grip  Reflexes - The patient's reflexes were symmetrical in all extremities and he had no pathological reflexes.  Sensory - Light touch, temperature/pinprick were assessed and were symmetrical.    Coordination - The patient had normal movements in the hands and feet with no ataxia or dysmetria.  Tremor was absent.  Gait and Station - deferred.   ASSESSMENT/PLAN Mr. Blake Carey is a 64 y.o. male with history of HTN and a-fib, on Eliquis, who presents via EMS as a Code Stroke after acute onset of right hemiplegia, aphasia and unable to follow instructions. while eating at a restaurant this evening. Time of onset is the same as LKN: 7253. He did not receive IV t-PA due to anticoagulation therapy. IR - left M1 occlusion with single pass of aspiration thrombectomy restoring complete flow through the M1.  Stroke: Scattered punctate infarcts left MCA territory due to left M1 occlusion s/p IR with TICI3 reperfusion, embolic due to A. fib even on Eliquis. Large vessel disease also possible given CTA/MRA findings  CT Head - Normal for age non contrast CT appearance of the brain. ASPECTS 10.   CTA H&N - Positive for Left MCA M1 large vessel occlusion. Additionally, the study is positive for: - Severe stenosis of the Dominant Left Vertebral Artery V4 segment, - Moderate distal Basilar Artery stenosis, - severe Right PCA P2 stenosis, - mild to moderate Right MCA branch irregularity.  MRI head - Few, tiny acute infarcts in the left MCA distribution, primarily at the basal ganglia. There is a punctate acute cortical infarct in the high right frontal lobe.  MRA head - treated left M1  segment remains patent with irregularity from atherosclerosis or treated thrombus. The upper division left M2 branch is severely stenotic with focal appearance likely reflecting atherosclerosis. high-grade right supraclinoid ICA stenosis. tandem high-grade P2 and P3 segment narrowings  2D Echo - 04/01/19 - EF 60 - 65%. No cardiac source of emboli identified.   Sars Corona Virus 2 - negative  LDL - 143  HgbA1c - 6.0  UDS - negative  VTE prophylaxis - heparin IV  Eliquis (apixaban) daily prior to admission, now on heparin IV. Plan for coumadin with INR goal 2-3 (family not able to afford $500 / month eliquis anymore). 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  MRA head confirmed the finding  On heparin IV  Not put on coumadin yet, given planned coiling this admission   Angio/coiling canceled  by Dr. Sherlon Carey given increased WBC  Resume diet  Afib with bradycardia  HR 40s  Follows with Dr. Tawanna [facility] in [place]  On Eliquis PTA  Now on heparin IV  Due to cost, will switch to Coumadin with INR 2-3 for long term AC  Close monitor heart rate  Did not resume home Coreg  Hypertension  Home BP meds: amlodipine ; HCTZ ; Zestril ; Coreg  Off cleviprex  Stable . Long-term BP goal normotensive  Hyperlipidemia  Home Lipid lowering medication: none  LDL 143, goal < 70  Current lipid lowering medication: Lipitor 40  Continue statin at discharge  Other Stroke Risk Factors  Advanced age  Obesity, recommend weight loss, diet and exercise as appropriate   Other Active Problems  Leukocytosis - 18.9->14.5->16.5->28.3-33-19 (afebrile).-33-19 UA and CXR 2 days ago were both ok. Will repeat 2V CXR, UA and Cx. Repeat labs in am.   AKI - creatinine - 1.19 -  resolved  Hypokalemia - 3.4 - supplement  Hospital day # 5  Plan continue Rocephin for UTI and IV hydration for worsening renal function.  Appreciate help from medical hospital list team to help manage  WBC and worsening renal function.  Continue ongoing treatment.  Neuro interventional procedure for angiogram will remain postponed till labs are improved.  Discussed with   Dr.Ghimire  medical hospitalist, patient, wife and daughter and answered questions.  Greater than 50% time during this 25-minute visit was spent on counseling and coordination of care and discussion with care team Delia Heady, MD To contact Stroke Continuity provider, please refer to [email or web address]. After hours, contact General Neurology

## 2019-06-25 NOTE — Progress Notes (Signed)
MEDICAL CONSULT FOLLOW UP PROGRESS NOTE        PATIENT DETAILS Name: Blake Carey Age: 64 y.o. Sex: male Date of Birth: 1955/06/05 Admit Date: 06/20/2019 Admitting Physician Kerney Elbe, MD ZOX:WRUE, Ronn Melena, NP  Reason for consultation: Evaluation of leukocytosis and AKI  Requesting physician: Dr. Leonie Man  Brief Narrative: Patient is a 64 y.o. male with h/o HTN and afib on Eliquis who presented on 4/54 with embolic CVA treated with IR reperfusion-medical consultation requested for evaluation of AKI and leukocytosis.  Subjective: Lying comfortably in bed-denies any chest pain or shortness of breath.  Assessment/Plan: AKI: Likely secondary to contrast-induced nephropathy-UA negative for proteinuria, check frequent bladder scans to ensure he is voiding appropriately-check renal ultrasound to ensure no hydronephrosis.  Continue to avoid nephrotoxic agents.  Hopefully creatinine will plateau and improve soon.  We will continue to follow closely.  Leukocytosis: Thought to be secondary to complicated UTI-leukocytosis downtrending-continue Rocephin-awaiting cultures.  We will plan on treating this as a complicated UTI for at least 5-7 days.  Complicated UTI: See above  Rest of the issues defer to the primary service.   Diet: Diet Order            Diet NPO time specified  Diet effective midnight        Diet regular Room service appropriate? Yes; Fluid consistency: Thin  Diet effective now               Code Status: Full code   Disposition Plan: Per primary service  Antimicrobial agents: Anti-infectives (From admission, onward)   Start     Dose/Rate Route Frequency Ordered Stop   06/23/19 1700  cefTRIAXone (ROCEPHIN) 2 g in sodium chloride 0.9 % 100 mL IVPB     2 g 200 mL/hr over 30 Minutes Intravenous Every 24 hours 06/23/19 1655         Time spent: 25 minutes-Greater than 50% of this time was spent in counseling, explanation of diagnosis, planning  of further management, and coordination of care.  MEDICATIONS: Scheduled Meds: . atorvastatin  40 mg Oral q1800  . Chlorhexidine Gluconate Cloth  6 each Topical Q0600  . insulin aspart  0-9 Units Subcutaneous TID WC  . pantoprazole  40 mg Oral Daily  . sodium chloride flush  3 mL Intravenous Once   Continuous Infusions: . sodium chloride 75 mL/hr at 06/25/19 0005  . cefTRIAXone (ROCEPHIN)  IV 2 g (06/24/19 1817)  . heparin 800 Units/hr (06/25/19 1140)   PRN Meds:.acetaminophen **OR** acetaminophen (TYLENOL) oral liquid 160 mg/5 mL **OR** acetaminophen, hydrALAZINE, senna-docusate, simethicone   PHYSICAL EXAM: Vital signs: Vitals:   06/25/19 0300 06/25/19 0719 06/25/19 0728 06/25/19 1134  BP: 107/73 (!) 147/80 (!) 147/80 (!) 143/78  Pulse: 81 (!) 114 98 100  Resp: 18 18 18 18   Temp: 98 F (36.7 C) 98.5 F (36.9 C) 98.5 F (36.9 C) 99.1 F (37.3 C)  TempSrc: Oral Oral Oral Oral  SpO2: 99% 99% 99% 99%   There were no vitals filed for this visit. There is no height or weight on file to calculate BMI.   Gen Exam:Alert awake-not in any distress HEENT:atraumatic, normocephalic Chest: B/L clear to auscultation anteriorly CVS:S1S2 regular Abdomen:soft non tender, non distended Extremities:no edema Neurology: Non focal Skin: no rash  I have personally reviewed following labs and imaging studies  LABORATORY DATA: CBC: Recent Labs  Lab 06/20/19 1935 06/20/19 1935 06/21/19 1027 06/22/19  9758 06/23/19 0647 06/24/19 0353 06/25/19 0220  WBC 18.9*   < > 14.5* 16.5* 28.3* 33.9* 16.9*  NEUTROABS 14.1*  --   --   --  24.8* 28.4* 14.2*  HGB 14.6   < > 13.4 13.1 14.6 13.4 12.3*  HCT 45.9   < > 41.1 41.6 45.1 41.9 37.7*  MCV 82.9   < > 82.2 83.2 81.7 82.0 81.1  PLT 259   < > 218 183 195 170 151   < > = values in this interval not displayed.    Basic Metabolic Panel: Recent Labs  Lab 06/21/19 1027 06/22/19 0509 06/23/19 0647 06/24/19 0353 06/25/19 0220  NA 138 137  136 135 134*  K 4.1 3.9 3.4* 3.6 3.7  CL 108 104 104 106 104  CO2 26 26 20* 19* 21*  GLUCOSE 95 84 100* 96 96  BUN 23 19 15  33* 47*  CREATININE 0.90 1.01 1.19 3.02* 3.40*  CALCIUM 8.3* 8.2* 8.5* 7.9* 7.8*    GFR: Estimated Creatinine Clearance: 19.5 mL/min (A) (by C-G formula based on SCr of 3.4 mg/dL (H)).  Liver Function Tests: Recent Labs  Lab 06/20/19 1935 06/24/19 0353  AST 29 30  ALT 67* 31  ALKPHOS 57 64  BILITOT 1.0 1.0  PROT 6.6 5.5*  ALBUMIN 3.5 2.4*   No results for input(s): LIPASE, AMYLASE in the last 168 hours. No results for input(s): AMMONIA in the last 168 hours.  Coagulation Profile: Recent Labs  Lab 06/20/19 1935 06/22/19 0509 06/23/19 0647 06/24/19 0353 06/25/19 0220  INR 1.0 1.3* 1.3* 1.3* 1.4*    Cardiac Enzymes: No results for input(s): CKTOTAL, CKMB, CKMBINDEX, TROPONINI in the last 168 hours.  BNP (last 3 results) No results for input(s): PROBNP in the last 8760 hours.  Lipid Profile: No results for input(s): CHOL, HDL, LDLCALC, TRIG, CHOLHDL, LDLDIRECT in the last 72 hours.  Thyroid Function Tests: No results for input(s): TSH, T4TOTAL, FREET4, T3FREE, THYROIDAB in the last 72 hours.  Anemia Panel: No results for input(s): VITAMINB12, FOLATE, FERRITIN, TIBC, IRON, RETICCTPCT in the last 72 hours.  Urine analysis:    Component Value Date/Time   COLORURINE YELLOW 06/23/2019 1500   APPEARANCEUR CLOUDY (A) 06/23/2019 1500   LABSPEC 1.011 06/23/2019 1500   PHURINE 6.0 06/23/2019 1500   GLUCOSEU NEGATIVE 06/23/2019 1500   HGBUR LARGE (A) 06/23/2019 1500   BILIRUBINUR NEGATIVE 06/23/2019 1500   KETONESUR 5 (A) 06/23/2019 1500   PROTEINUR NEGATIVE 06/23/2019 1500   NITRITE NEGATIVE 06/23/2019 1500   LEUKOCYTESUR LARGE (A) 06/23/2019 1500    Sepsis Labs: Lactic Acid, Venous No results found for: LATICACIDVEN  MICROBIOLOGY: Recent Results (from the past 240 hour(s))  SARS Coronavirus 2 by RT PCR (hospital order, performed in  [facility] hospital lab) Nasopharyngeal Nasopharyngeal Swab     Status: None   Collection Time: 06/20/19  7:40 PM   Specimen: Nasopharyngeal Swab  Result Value Ref Range Status   SARS Coronavirus 2 NEGATIVE NEGATIVE Final    Comment: (NOTE) SARS-CoV-2 target nucleic acids are NOT DETECTED. The SARS-CoV-2 RNA is generally detectable in upper and lower respiratory specimens during the acute phase of infection. The lowest concentration of SARS-CoV-2 viral copies this assay can detect is 250 copies / mL. A negative result does not preclude SARS-CoV-2 infection and should not be used as the sole basis for treatment or other patient management decisions.  A negative result may occur with improper specimen collection / handling, submission of specimen other than  nasopharyngeal swab, presence of viral mutation(s) within the areas targeted by this assay, and inadequate number of viral copies (<250 copies / mL). A negative result must be combined with clinical observations, patient history, and epidemiological information. Fact Sheet for Patients:   BoilerBrush.com.cy Fact Sheet for Healthcare Providers: https://pope.com/ This test is not yet approved or cleared  by the [place] FDA and has been authorized for detection and/or diagnosis of SARS-CoV-2 by FDA under an Emergency Use Authorization (EUA).  This EUA will remain in effect (meaning this test can be used) for the duration of the COVID-19 declaration under Section 564(b)(1) of the Act, 21 U.S.C. section 360bbb-3(b)(1), unless the authorization is terminated or revoked sooner. Performed at [facility], [address] [place], [place] [address]   Urine Culture     Status: Abnormal   Collection Time: 06/23/19 11:49 AM   Specimen: Urine, Random  Result Value Ref Range Status   Specimen Description URINE, RANDOM  Final   Special Requests   Final    NONE Performed at [facility], [address], [place], [place] [address]    Culture >=100,000 COLONIES/mL ESCHERICHIA COLI (A)  Final   Report Status 06/25/2019 FINAL  Final   Organism ID, Bacteria ESCHERICHIA COLI (A)  Final      Susceptibility   Escherichia coli - MIC*    AMPICILLIN <=2 SENSITIVE Sensitive     CEFAZOLIN <=4 SENSITIVE Sensitive     CEFTRIAXONE <=1 SENSITIVE Sensitive     CIPROFLOXACIN <=0.25 SENSITIVE Sensitive     GENTAMICIN <=1 SENSITIVE Sensitive     IMIPENEM <=0.25 SENSITIVE Sensitive     NITROFURANTOIN <=16 SENSITIVE Sensitive     TRIMETH/SULFA <=20 SENSITIVE Sensitive     AMPICILLIN/SULBACTAM <=2 SENSITIVE Sensitive     PIP/TAZO <=4 SENSITIVE Sensitive     * >=100,000 COLONIES/mL ESCHERICHIA COLI    RADIOLOGY STUDIES/RESULTS: DG Chest Port 1 View  Result Date: 06/23/2019 CLINICAL DATA:  Leukocytosis EXAM: PORTABLE CHEST 1 VIEW COMPARISON:  Jun 21, 2019 FINDINGS: Lungs are clear. Heart is upper normal in size with pulmonary vascularity normal. No adenopathy. No bone lesions. IMPRESSION: Lungs clear.  Heart upper normal in size.  No adenopathy. Electronically Signed   By: Bretta Bang III M.D.   On: 06/23/2019 12:34     LOS: 5 days   Jeoffrey Massed, MD  [facility]    To contact the attending provider between 7A-7P or the covering provider during after hours 7P-7A, please log into the web site [email or web address] and access using universal [place] password for that web site. If you do not have the password, please call the hospital operator.  06/25/2019, 11:41 AM

## 2019-06-25 NOTE — Progress Notes (Signed)
Physical Therapy Treatment Patient Details Name: Blake Carey MRN: [id number] DOB: 02/18/1955 Today's Date: 06/25/2019    History of Present Illness 64 y.o male admitted for stroke work up after R hemiplegia and aphasia. Imaging positive for Left MCA M1 large vessel occlusion. PMH includes HTN and a-fib, on Eliquis    PT Comments    Patient is making progress toward PT goals. Current plan remains appropriate.    Follow Up Recommendations  Outpatient PT     Equipment Recommendations  None recommended by PT    Recommendations for Other Services       Precautions / Restrictions Precautions Precautions: Fall Restrictions Weight Bearing Restrictions: No    Mobility  Bed Mobility Overal bed mobility: Modified Independent Bed Mobility: Sit to Supine;Supine to Sit              Transfers Overall transfer level: Modified independent Equipment used: None Transfers: Sit to/from Stand              Ambulation/Gait Ambulation/Gait assistance: Min Gaffer (Feet): 250 Feet Assistive device: None Gait Pattern/deviations: Step-through pattern;Drifts right/left     General Gait Details: decreased cadence and mild drifting; no overt LOB   Stairs Stairs: Yes Stairs assistance: Supervision Stair Management: Two rails;Alternating pattern Number of Stairs: 3     Wheelchair Mobility    Modified Rankin (Stroke Patients Only) Modified Rankin (Stroke Patients Only) Pre-Morbid Rankin Score: No symptoms Modified Rankin: Slight disability     Balance Overall balance assessment: Needs assistance Sitting-balance support: No upper extremity supported;Feet supported Sitting balance-Leahy Scale: Good     Standing balance support: No upper extremity supported;During functional activity Standing balance-Leahy Scale: Fair                              Cognition Arousal/Alertness: Awake/alert Behavior During Therapy: WFL for tasks  assessed/performed Overall Cognitive Status: Within Functional Limits for tasks assessed                                        Exercises      General Comments        Pertinent Vitals/Pain Pain Assessment: Faces Faces Pain Scale: Hurts a little bit Pain Location: stomach Pain Descriptors / Indicators: Discomfort Pain Intervention(s): Monitored during session;Other (comment)(improved after ambulating)    Home Living                      Prior Function            PT Goals (current goals can now be found in the care plan section) Progress towards PT goals: Progressing toward goals    Frequency    Min 4X/week      PT Plan Current plan remains appropriate    Co-evaluation              AM-PAC PT "6 Clicks" Mobility   Outcome Measure  Help needed turning from your back to your side while in a flat bed without using bedrails?: None Help needed moving from lying on your back to sitting on the side of a flat bed without using bedrails?: None Help needed moving to and from a bed to a chair (including a wheelchair)?: None Help needed standing up from a chair using your arms (e.g., wheelchair or bedside chair)?: None Help needed to walk in  hospital room?: None Help needed climbing 3-5 steps with a railing? : A Little 6 Click Score: 23    End of Session   Activity Tolerance: Patient tolerated treatment well Patient left: with call bell/phone within reach;in bed;with family/visitor present;with nursing/sitter in room Nurse Communication: Mobility status PT Visit Diagnosis: Other abnormalities of gait and mobility (R26.89);Other symptoms and signs involving the nervous system (R29.898)     Time: 1555-1610 PT Time Calculation (min) (ACUTE ONLY): 15 min  Charges:  $Gait Training: 8-22 mins                     Erline Levine, PTA Acute Rehabilitation Services Pager: [phone] Office: [phone]     Carolynne Edouard 06/25/2019, 5:51 PM

## 2019-06-25 NOTE — Progress Notes (Signed)
   06/25/19 2215  Provider Notification  Provider Name/Title Dr Dorien Chihuahua  Date Provider Notified 06/25/19  Time Provider Notified 2216  Notification Type Page  Notification Reason Other (Comment) (Pt's bladder scan read 806)  Response See new orders  Date of Provider Response 06/25/19  Time of Provider Response 2216

## 2019-06-26 ENCOUNTER — Other Ambulatory Visit (HOSPITAL_COMMUNITY): Payer: Self-pay

## 2019-06-26 ENCOUNTER — Encounter (HOSPITAL_COMMUNITY)
Admission: EM | Disposition: A | Discharge status: Home / Self Care | Payer: Self-pay | Source: Home / Self Care | Attending: Neurology

## 2019-06-26 DIAGNOSIS — I63132 Cerebral infarction due to embolism of left carotid artery: Secondary | ICD-10-CM

## 2019-06-26 LAB — BASIC METABOLIC PANEL
Anion gap: 10 (ref 5–15)
BUN: 38 mg/dL — ABNORMAL HIGH (ref 8–23)
CO2: 18 mmol/L — ABNORMAL LOW (ref 22–32)
Calcium: 7.8 mg/dL — ABNORMAL LOW (ref 8.9–10.3)
Chloride: 107 mmol/L (ref 98–111)
Creatinine, Ser: 1.86 mg/dL — ABNORMAL HIGH (ref 0.61–1.24)
GFR calc Af Amer: 43 mL/min — ABNORMAL LOW (ref >60)
GFR calc non Af Amer: 37 mL/min — ABNORMAL LOW (ref >60)
Glucose, Bld: 88 mg/dL (ref 70–99)
Potassium: 3.9 mmol/L (ref 3.5–5.1)
Sodium: 135 mmol/L (ref 135–145)

## 2019-06-26 LAB — CBC
HCT: 35.1 % — ABNORMAL LOW (ref 39.0–52.0)
Hemoglobin: 11.3 g/dL — ABNORMAL LOW (ref 13.0–17.0)
MCH: 26.3 pg (ref 26.0–34.0)
MCHC: 32.2 g/dL (ref 30.0–36.0)
MCV: 81.8 fL (ref 80.0–100.0)
Platelets: 129 10*3/uL — ABNORMAL LOW (ref 150–400)
RBC: 4.29 MIL/uL (ref 4.22–5.81)
RDW: 16.6 % — ABNORMAL HIGH (ref 11.5–15.5)
WBC: 9.8 10*3/uL (ref 4.0–10.5)
nRBC: 0 % (ref 0.0–0.2)

## 2019-06-26 LAB — HEPARIN LEVEL (UNFRACTIONATED)
Heparin Unfractionated: 0.24 IU/mL — ABNORMAL LOW (ref 0.30–0.70)
Heparin Unfractionated: 0.29 IU/mL — ABNORMAL LOW (ref 0.30–0.70)

## 2019-06-26 LAB — SURGICAL PCR SCREEN
MRSA, PCR: NEGATIVE
Staphylococcus aureus: NEGATIVE

## 2019-06-26 LAB — PROTIME-INR
INR: 1.3 — ABNORMAL HIGH (ref 0.8–1.2)
Prothrombin Time: 15.5 seconds — ABNORMAL HIGH (ref 11.4–15.2)

## 2019-06-26 LAB — GLUCOSE, CAPILLARY
Glucose-Capillary: 114 mg/dL — ABNORMAL HIGH (ref 70–99)
Glucose-Capillary: 117 mg/dL — ABNORMAL HIGH (ref 70–99)

## 2019-06-26 SURGERY — IR WITH ANESTHESIA
Anesthesia: General

## 2019-06-26 MED ORDER — TAMSULOSIN HCL 0.4 MG PO CAPS: 0.4000 mg | ORAL_CAPSULE | Freq: Every day | ORAL | 2 refills | Status: DC

## 2019-06-26 MED ORDER — RIVAROXABAN 20 MG PO TABS
20.0000 mg | ORAL_TABLET | Freq: Every day | ORAL | Status: DC
  Filled 2019-06-26: qty 1

## 2019-06-26 MED ORDER — CEPHALEXIN 500 MG PO CAPS: 500.0000 mg | ORAL_CAPSULE | Freq: Three times a day (TID) | ORAL | 0 refills | Status: DC

## 2019-06-26 MED ORDER — RIVAROXABAN 20 MG PO TABS: 20.0000 mg | ORAL_TABLET | Freq: Every day | ORAL | 1 refills | Status: DC

## 2019-06-26 MED ORDER — BENZONATATE 100 MG PO CAPS: 100.0000 mg | ORAL_CAPSULE | Freq: Three times a day (TID) | ORAL | 0 refills | Status: DC | PRN

## 2019-06-26 MED ORDER — TAMSULOSIN HCL 0.4 MG PO CAPS
0.4000 mg | ORAL_CAPSULE | Freq: Every day | ORAL | Status: DC
  Administered 2019-06-26: 0.4 mg via ORAL
  Filled 2019-06-26 (×2): qty 1

## 2019-06-26 MED ORDER — CEPHALEXIN 500 MG PO CAPS
500.0000 mg | ORAL_CAPSULE | Freq: Three times a day (TID) | ORAL | Status: DC
  Administered 2019-06-26: 500 mg via ORAL
  Filled 2019-06-26: qty 1

## 2019-06-26 MED ORDER — CEPHALEXIN 500 MG PO CAPS: 500.0000 mg | ORAL_CAPSULE | Freq: Three times a day (TID) | ORAL | Status: DC

## 2019-06-26 MED ORDER — RIVAROXABAN 20 MG PO TABS
20.0000 mg | ORAL_TABLET | Freq: Every day | ORAL | Status: DC
  Administered 2019-06-26: 20 mg via ORAL

## 2019-06-26 MED ORDER — LOPERAMIDE HCL 2 MG PO CAPS
2.0000 mg | ORAL_CAPSULE | ORAL | Status: DC | PRN
  Administered 2019-06-26: 2 mg via ORAL
  Filled 2019-06-26: qty 1

## 2019-06-26 MED ORDER — ATORVASTATIN CALCIUM 40 MG PO TABS: 40.0000 mg | ORAL_TABLET | Freq: Every day | ORAL | 2 refills | Status: DC

## 2019-06-26 MED ORDER — AMLODIPINE BESYLATE 5 MG PO TABS
2.5000 mg | ORAL_TABLET | Freq: Every day | ORAL | Status: DC
Start: 2019-06-26 — End: 2019-06-26

## 2019-06-26 MED ORDER — AMLODIPINE BESYLATE 5 MG PO TABS
2.5000 mg | ORAL_TABLET | Freq: Every day | ORAL | 2 refills | Status: DC
Start: 2019-06-26 — End: 2020-11-17

## 2019-06-26 MED FILL — TAMSULOSIN HCL 0.4 MG CAP: 0.4 | 30 days supply | Qty: 30 | Fill #0

## 2019-06-26 MED FILL — AMLODIPINE BESYLATE 5 MG TA: 5 | 30 days supply | Qty: 15 | Fill #0

## 2019-06-26 MED FILL — ATORVASTATIN CALCIUM 40 MG: 40 | 30 days supply | Qty: 30 | Fill #0

## 2019-06-26 MED FILL — XARELTO 20 MG TABLET: 20 | 30 days supply | Qty: 30 | Fill #0

## 2019-06-26 MED FILL — BENZONATATE 100 MG CAP: 100 | 6 days supply | Qty: 20 | Fill #0

## 2019-06-26 MED FILL — CEPHALEXIN 500 MG CAPS: 500 | 3 days supply | Qty: 9 | Fill #0

## 2019-06-26 NOTE — Progress Notes (Signed)
RN bladder scanned pt. Pt retaining 780 ml of ruine. This will be patients 4th I&O cath insertion in 24 hrs. RN attempted I&O cath insertion but met resistance. Notified MD Leonie Man and MD Ghimire. Awaiting orders

## 2019-06-26 NOTE — Progress Notes (Signed)
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 Patient laying in bed resting comfortably. He responds to voice and answers questions appropriately. Denies new neurological symptoms (weakness, speech difficulty). States his urinary frequency/dysuria have resolved at this time.   Allergies: Patient has no known allergies.  Medications: Prior to Admission medications   Medication Sig Start Date End Date Taking? Authorizing Provider  amLODipine (NORVASC) 5 MG tablet Take 1 tablet (5 mg total) by mouth daily. Patient taking differently: Take 2.5 mg by mouth daily.  06/12/19 09/10/19 Yes Tobb, Kardie, DO  apixaban (ELIQUIS) 5 MG TABS tablet Take 1 tablet (5 mg total) by mouth 2 (two) times daily. 05/01/19  Yes Tobb, Kardie, DO  benzonatate (TESSALON) 100 MG capsule Take 100 mg by mouth 3 (three) times daily as needed.   Yes [provider]  carvedilol (COREG) 12.5 MG tablet Take 1 tablet (12.5 mg total) by mouth 2 (two) times daily. 05/01/19 07/30/19 Yes Tobb, Kardie, DO  hydrochlorothiazide (MICROZIDE) 12.5 MG capsule Take 1 capsule (12.5 mg total) by mouth daily. 06/12/19 09/10/19 Yes Tobb, Kardie, DO  lisinopril (ZESTRIL) 20 MG tablet Take 20 mg by mouth daily.   Yes [provider]  PREDNISONE PO Take by mouth as directed.    [provider]     Vital Signs: BP 127/66 (BP Location: Left Arm)   Pulse 64   Temp 98.2 F (36.8 C) (Oral)   Resp 16   Wt 168 lb (76.2 kg)   SpO2 100%   BMI 29.76 kg/m   Physical Exam Vitals and nursing note reviewed.  Constitutional:      General: He is not in acute distress.    Appearance: Normal appearance.  Cardiovascular:     Rate and Rhythm: Normal rate and regular rhythm.     Heart sounds: Normal heart sounds. No murmur.   Pulmonary:     Effort: Pulmonary effort is normal. No respiratory distress.     Breath sounds: Normal breath sounds. No wheezing.  Abdominal:     Tenderness: There is no right CVA tenderness or left CVA tenderness.  Skin:    General: Skin is warm and dry.  Neurological:     Mental Status: He is alert and oriented to person, place, and time.     Comments: Can spontaneously move all extremities.     Imaging: US RENAL  Result Date: 06/25/2019 CLINICAL DATA:  Acute kidney injury EXAM: RENAL / URINARY TRACT ULTRASOUND COMPLETE COMPARISON:  None. FINDINGS: Right Kidney: Renal measurements: 12.4 x 6.4 x 6.5 cm = volume: 268.4 mL. Cortex appears slightly echogenic. Mild hydronephrosis. No focal mass. Mild perinephric fluid or edema Left Kidney: Renal measurements: 12 x 7 x 6.7 cm = volume: 293.7 mL. Cortex appears slightly echogenic. Mild left hydronephrosis. Small amount of left perinephric fluid or edema. Bladder: Appears normal for degree of bladder distention. Particulate debris in the bladder. Other: None. IMPRESSION: 1. Slightly echogenic kidneys bilaterally. There is mild bilateral hydronephrosis. 2. Trace perinephric edema/fluid 3. Distended urinary bladder with debris Electronically Signed   By: Donavan Foil M.D.   On: 06/25/2019 17:22   DG Chest Port 1 View  Result Date: 06/23/2019 CLINICAL DATA:  Leukocytosis EXAM: PORTABLE CHEST 1 VIEW COMPARISON:  Jun 21, 2019 FINDINGS: Lungs are clear. Heart is upper normal in size  with pulmonary vascularity normal. No adenopathy. No bone lesions. IMPRESSION: Lungs clear.  Heart upper normal in size.  No adenopathy. Electronically Signed   By: Bretta Bang III M.D.   On: 06/23/2019 12:34    Labs:  CBC: Recent Labs    06/23/19 0647 06/24/19 0353 06/25/19 0220 06/26/19 0349  WBC 28.3* 33.9* 16.9* 9.8  HGB 14.6 13.4 12.3* 11.3*  HCT 45.1 41.9 37.7* 35.1*  PLT 195 170 151 129*    COAGS: Recent Labs    06/20/19 1935 06/20/19 1935  06/21/19 2101 06/22/19 0509 06/22/19 0509 06/23/19 0647 06/24/19 0353 06/25/19 0220 06/26/19 0349  INR 1.0   < >  --  1.3*   < > 1.3* 1.3* 1.4* 1.3*  APTT 27  --  69* 75*  --   --   --   --   --    < > = values in this interval not displayed.    BMP: Recent Labs    06/23/19 0647 06/24/19 0353 06/25/19 0220 06/26/19 0747  NA 136 135 134* 135  K 3.4* 3.6 3.7 3.9  CL 104 106 104 107  CO2 20* 19* 21* 18*  GLUCOSE 100* 96 96 88  BUN 15 33* 47* 38*  CALCIUM 8.5* 7.9* 7.8* 7.8*  CREATININE 1.19 3.02* 3.40* 1.86*  GFRNONAA >60 21* 18* 37*  GFRAA >60 24* 21* 43*    LIVER FUNCTION TESTS: Recent Labs    06/20/19 1935 06/24/19 0353  BILITOT 1.0 1.0  AST 29 30  ALT 67* 31  ALKPHOS 57 64  PROT 6.6 5.5*  ALBUMIN 3.5 2.4*    Assessment and Plan:  ACOM aneurysm, incidental finding on CTA head/neck 06/20/2019 during stroke work-up. Discussed symptoms with patient. His urinary symptoms have resolved at this time. No change in neurologic symptoms. Reviewed AM labs- WBCs WNL following IV antibiotic use. However, renal function has declined the past few days (Cr currently 1.86, was up to 3.40 yesterday, baseline 0.9-1.01 per chart). Due to risk of contrast induced renal failure with current renal function, recommend postponing procedure to occur during outpatient basis- neurology aware as well. Will restart patient's diet for today. Discussed above with patient. Dr. Tommie Sams spoke with patient's daughter, Darien Ramus, via telephone to update on above as well. All questions answered and concerns addressed. Our schedulers to call patient to reschedule procedure on outpatient basis. Further plans per neurology- appreciate and agree with management. Please call NIR with questions/concerns.  Tele-interpretor [phone] was used throughout today's interaction.   Electronically Signed: Elwin Mocha, PA-C 06/26/2019, 9:19 AM   I spent a total of 35 Minutes at the the patient's bedside  AND on the patient's hospital floor or unit, greater than 50% of which was counseling/coordinating care for ACOM aneurysm.

## 2019-06-26 NOTE — Progress Notes (Signed)
Patient being discharged home with outpatient therapy. Education and information provided to pt and pt wife and daughter. IV's removed. CCMD notified. Pt going home with foley catheter. All belongings with patient. Leaving unit via wheelchair.

## 2019-06-26 NOTE — Plan of Care (Signed)
Will transition IV heparin to xarelto in hospital prior to d/c. Pt discharged but waiting on meds from TOC.  Pt ordered dinner - once supper arrives, he will eat, stop heparin and start xarelto He will continue xarelto nightly with supper.  Discussed with RN who discussed with family. Pharmacy met with family r/t xarelto / meds as well  Sharon Biby, MSN, APRN, ANVP-BC, AGPCNP-BC Advanced Practice Stroke Nurse [facility] See Amion for Schedule & Pager information 06/26/2019 3:54 PM  

## 2019-06-26 NOTE — TOC Transition Note (Signed)
Transition of Care [facility]) - CM/SW Discharge Note   Patient Details  Name: Blake Carey MRN: [id number] Date of Birth: 06/04/1955  [facility] CM/SW Contact:  Kermit Balo, RN Phone Number: 06/26/2019, 3:51 PM   Clinical Narrative:    Pt discharging home with family. He will have outpatient therapy through [facility] outpatient therapy.  Medications filled through [facility] pharmacy and delivered to the room.  Pt has supervision at home and transportation to home.   Final next level of care: OP Rehab Barriers to Discharge: Inadequate or no insurance, Barriers Unresolved (comment)   Patient Goals and CMS Choice   CMS Medicare.gov Compare Post Acute Care list provided to:: Patient Represenative (must comment) Choice offered to / list presented to : Adult Children  Discharge Placement                       Discharge Plan and Services   Discharge Planning Services: CM Consult                                 Social Determinants of Health (SDOH) Interventions     Readmission Risk Interventions No flowsheet data found.

## 2019-06-26 NOTE — Progress Notes (Signed)
Spoke with Dr. Chancy Hurter that the has had another episode of acute urinary retention.  Recommend that we go ahead and place a Foley catheter-continue Flomax-patient will need urology follow-up for outpatient voiding trial.  I suspect patient's UTI was likely due to urinary retention.  Once Foley catheter placed-he is still okay from my point of view of being discharged home with quick outpatient follow-up with urology.

## 2019-06-26 NOTE — Progress Notes (Signed)
ANTICOAGULATION CONSULT NOTE  Pharmacy Consult for Heparin  Indication: stroke  No Known Allergies  Patient Measurements:   Actual body weight: 76.2 kg Ideal body weight: 56.9 kg (125 lb 7.1 oz) Adjusted ideal body weight: 64.6 kg (142 lb 7.4 oz)   Heparin Dosing Weight: 72.5 kg  Vital Signs: Temp: 98.4 F (36.9 C) (05/28 0446) Temp Source: Oral (05/28 0446) BP: 116/73 (05/28 0446) Pulse Rate: 92 (05/28 0446)  Labs: Recent Labs    06/23/19 0647 06/23/19 1683 06/24/19 0353 06/24/19 0353 06/25/19 0220 06/25/19 0220 06/25/19 1025 06/25/19 2052 06/26/19 0349  HGB 14.6   < > 13.4   < > 12.3*  --   --   --  11.3*  HCT 45.1   < > 41.9  --  37.7*  --   --   --  35.1*  PLT 195   < > 170  --  151  --   --   --  129*  LABPROT 16.1*   < > 16.0*  --  16.2*  --   --   --  15.5*  INR 1.3*   < > 1.3*  --  1.4*  --   --   --  1.3*  HEPARINUNFRC 0.39   < > 0.47   < > 0.29*   < > 0.25* 0.24* 0.24*  CREATININE 1.19  --  3.02*  --  3.40*  --   --   --   --    < > = values in this interval not displayed.    Estimated Creatinine Clearance: 19.5 mL/min (A) (by C-G formula based on SCr of 3.4 mg/dL (H)).  Assessment: 62 YOM who presented on 5/22 w/ R-hemiplegia and aphasia and found to have an acute stroke. The patient was on Apixaban PTA for hx Afib. Given the new stroke - pharmacy has been consulted to transition to warfarin with a heparin bridge.  Warfarin currently on hold due to possible procedure  Heparin level still subtherapeutic (0.25) on gtt at 900 units/hr. RN switched heparin to different line so no issues overnight with beeping/line being kinked. Hemoglobin and plt trending down.  Goal of Therapy:  Heparin level 0.3-0.5 units/ml Monitor platelets by anticoagulation protocol: Yes   Plan:  Increase heparin to 1050 units/hr  Will f/u 8 hr heparin level  Christoper Fabian, PharmD, BCPS Please see amion for complete clinical pharmacist phone list  06/26/2019 5:46 AM

## 2019-06-26 NOTE — Discharge Summary (Addendum)
Stroke Discharge Summary  Patient ID: Blake Carey   MRN: [id number]      DOB: [birth year]  Date of Admission: 06/20/2019 Date of Discharge: 06/26/2019  Attending Physician:  Garvin Fila, MD, Stroke MD Consultant(s):   Pedro Earls MD (Interventional Neuroradiologist), [facility]  Patient's PCP:  Imagene Riches, NP  DISCHARGE DIAGNOSIS:  Principal Problem:   Stroke (cerebrum) [facility]) Active Problems:   Essential hypertension   Dyslipidemia   Paroxysmal atrial fibrillation ([place])   Obesity (BMI 30-39.9)   Leukocytosis   AKI (acute kidney injury) ([place])   Aneurysm of anterior communicating artery   Acute lower UTI   Allergies as of 06/26/2019   No Known Allergies     Medication List    STOP taking these medications   apixaban 5 MG Tabs tablet Commonly known as: ELIQUIS   carvedilol 12.5 MG tablet Commonly known as: COREG   hydrochlorothiazide 12.5 MG capsule Commonly known as: MICROZIDE   lisinopril 20 MG tablet Commonly known as: ZESTRIL   PREDNISONE PO     TAKE these medications   amLODipine 5 MG tablet Commonly known as: NORVASC Take 0.5 tablets (2.5 mg total) by mouth daily.   atorvastatin 40 MG tablet Commonly known as: LIPITOR Take 1 tablet (40 mg total) by mouth daily at 6 PM.   benzonatate 100 MG capsule Commonly known as: TESSALON Take 100 mg by mouth 3 (three) times daily as needed.   cephALEXin 500 MG capsule Commonly known as: KEFLEX Take 1 capsule (500 mg total) by mouth 3 (three) times daily.   rivaroxaban 20 MG Tabs tablet Commonly known as: XARELTO Take 1 tablet (20 mg total) by mouth daily with supper. Take with the biggest meal of the day at the same time every day. Do not miss a dose. Set your alarm to remind you to take your Xarelto.   tamsulosin 0.4 MG Caps capsule Commonly known as: FLOMAX Take 1 capsule (0.4 mg total) by mouth daily. Start taking on: Jun 27, 2019       LABORATORY  STUDIES CBC    Component Value Date/Time   WBC 9.8 06/26/2019 0349   RBC 4.29 06/26/2019 0349   HGB 11.3 (L) 06/26/2019 0349   HGB 13.8 06/12/2019 0946   HCT 35.1 (L) 06/26/2019 0349   HCT 44.6 06/12/2019 0946   PLT 129 (L) 06/26/2019 0349   PLT 231 06/12/2019 0946   MCV 81.8 06/26/2019 0349   MCV 84 06/12/2019 0946   MCH 26.3 06/26/2019 0349   MCHC 32.2 06/26/2019 0349   RDW 16.6 (H) 06/26/2019 0349   RDW 15.0 06/12/2019 0946   LYMPHSABS 1.0 06/25/2019 0220   MONOABS 1.2 (H) 06/25/2019 0220   EOSABS 0.2 06/25/2019 0220   BASOSABS 0.0 06/25/2019 0220   CMP    Component Value Date/Time   NA 135 06/26/2019 0747   NA 137 06/12/2019 0946   K 3.9 06/26/2019 0747   CL 107 06/26/2019 0747   CO2 18 (L) 06/26/2019 0747   GLUCOSE 88 06/26/2019 0747   BUN 38 (H) 06/26/2019 0747   BUN 22 06/12/2019 0946   CREATININE 1.86 (H) 06/26/2019 0747   CALCIUM 7.8 (L) 06/26/2019 0747   PROT 5.5 (L) 06/24/2019 0353   ALBUMIN 2.4 (L) 06/24/2019 0353   AST 30 06/24/2019 0353   ALT 31 06/24/2019 0353   ALKPHOS 64 06/24/2019 0353   BILITOT 1.0 06/24/2019 0353   GFRNONAA 37 (L) 06/26/2019 0747  GFRAA 43 (L) 06/26/2019 0747   COAGS Lab Results  Component Value Date   INR 1.3 (H) 06/26/2019   INR 1.4 (H) 06/25/2019   INR 1.3 (H) 06/24/2019   Lipid Panel    Component Value Date/Time   CHOL 212 (H) 06/21/2019 0236   TRIG 94 06/21/2019 0236   HDL 50 06/21/2019 0236   CHOLHDL 4.2 06/21/2019 0236   VLDL 19 06/21/2019 0236   LDLCALC 143 (H) 06/21/2019 0236   HgbA1C  Lab Results  Component Value Date   HGBA1C 6.0 (H) 06/21/2019   Urinalysis    Component Value Date/Time   COLORURINE YELLOW 06/23/2019 1500   APPEARANCEUR CLOUDY (A) 06/23/2019 1500   LABSPEC 1.011 06/23/2019 1500   PHURINE 6.0 06/23/2019 1500   GLUCOSEU NEGATIVE 06/23/2019 1500   HGBUR LARGE (A) 06/23/2019 1500   BILIRUBINUR NEGATIVE 06/23/2019 1500   KETONESUR 5 (A) 06/23/2019 1500   PROTEINUR NEGATIVE  06/23/2019 1500   NITRITE NEGATIVE 06/23/2019 1500   LEUKOCYTESUR LARGE (A) 06/23/2019 1500   Urine Drug Screen     Component Value Date/Time   LABOPIA NONE DETECTED 06/21/2019 0753   COCAINSCRNUR NONE DETECTED 06/21/2019 0753   LABBENZ NONE DETECTED 06/21/2019 0753   AMPHETMU NONE DETECTED 06/21/2019 0753   THCU NONE DETECTED 06/21/2019 0753   LABBARB NONE DETECTED 06/21/2019 0753    Alcohol Level No results found for: [facility]   SIGNIFICANT DIAGNOSTIC STUDIES CT ANGIO NECK W OR WO CONTRAST  Result Date: 06/20/2019 CLINICAL DATA:  64 year old male code stroke presentation with right side weakness. EXAM: CT ANGIOGRAPHY HEAD AND NECK TECHNIQUE: Multidetector CT imaging of the head and neck was performed using the standard protocol during bolus administration of intravenous contrast. Multiplanar CT image reconstructions and MIPs were obtained to evaluate the vascular anatomy. Carotid stenosis measurements (when applicable) are obtained utilizing NASCET criteria, using the distal internal carotid diameter as the denominator. CONTRAST:  75mL OMNIPAQUE IOHEXOL 350 MG/ML SOLN COMPARISON:  Head CT 1939 hours today. FINDINGS: CTA NECK Skeleton: No acute osseous abnormality identified. Age-appropriate cervical spine degeneration. Upper chest: Negative; patent visible central pulmonary arteries. Other neck: Negative, no acute findings. Aortic arch: 3 vessel arch configuration with mild to moderate mostly soft thoracic aorta plaque. Right carotid system: Brachiocephalic artery and right CCA origin are patent without stenosis despite some plaque. Tortuous proximal right CCA. Minimal plaque at the right ICA origin with no stenosis to the skull base. Left carotid system: Minimal plaque at the left CCA and left ICA origins. Tortuous left ICA distal to the bulb. No stenosis to the skull base. Vertebral arteries: No significant proximal right subclavian artery stenosis despite soft plaque. Patent right vertebral  artery origin without stenosis. Non dominant appearing right vertebral artery remains patent to the skull base. No proximal left subclavian artery stenosis despite plaque. Normal left vertebral artery origin. Tortuous left V1 segment. Mild to moderate stenosis in the left V2 segment on series 7, image 238 presumably due to soft plaque. Otherwise the left vertebral is patent to the skull base. CTA HEAD Early intracranial contrast timing. Posterior circulation: Patent PICA origins which occurred early. The left V4 segment appears dominant however, there is a severe distal V4 stenosis on the left (series 11, image 28 and series 7, image 147. No right V4 stenosis although that side is non dominant. Patent vertebrobasilar junction. Patent although highly diminutive basilar artery, and moderate distal basilar stenosis proximal to the SCA origins (series 11, image 26 and series 12, image 22.  SCA origins remain patent. Left PCA origin is patent. Fetal type right PCA origin. Bilateral PCA branches are diminutive. Those on the left are within normal limits. There is additional severe stenosis of the right PCA P2 segment on series 10, image 23. Anterior circulation: Both ICA siphons are patent. And there is no left siphon plaque or stenosis. On the right side there is mild supraclinoid ICA stenosis. Carotid termini remain patent. MCA and left ACA origins are patent. The right A1 appears to be diminutive or absent. The anterior communicating artery is patent although there is a saccular 5 mm aneurysm of the anterior communicating artery directed anteriorly and superiorly (series 7, image 100 and series 11, image 23). Otherwise mild irregularity of the ACA branches. The right MCA M1 segment and bifurcation are patent. Right MCA branches appear patent with mild to moderate irregularity (series 12, image 15). The left MCA M1 occludes distal to the anterior temporal artery origin as seen on series 10, image 23. There is low to  intermediate reconstitution of left MCA branches. Venous sinuses: Not evaluated due to early contrast timing. Anatomic variants: Dominant left vertebral artery. Fetal type right PCA origin. Dominant left and diminutive or absent right ACA A1 segments. Review of the MIP images confirms the above findings IMPRESSION: 1. Positive for Left MCA M1 large vessel occlusion. 2. 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 3. Preliminary results of the above were communicated to Dr. Otelia Limes at 8:02 pm by text page via the [facility] messaging system. 4. Additionally, the study is positive for: - Severe stenosis of the Dominant Left Vertebral Artery V4 segment, - Moderate distal Basilar Artery stenosis, - severe Right PCA P2 stenosis, - mild to moderate Right MCA branch irregularity. Final results were communicated to Dr. Otelia Limes at 8:13 pm on 06/20/2019 by text page via the [facility] messaging system. Electronically Signed   By: Odessa Fleming M.D.   On: 06/20/2019 20:14   MR ANGIO HEAD WO CONTRAST  Result Date: 06/21/2019 CLINICAL DATA:  Right hemiplegia and aphasia. EXAM: MRI HEAD WITHOUT CONTRAST MRA HEAD WITHOUT CONTRAST TECHNIQUE: Multiplanar, multiecho pulse sequences of the brain and surrounding structures were obtained without intravenous contrast. Angiographic images of the head were obtained using MRA technique without contrast. COMPARISON:  None. FINDINGS: MRI HEAD FINDINGS Brain: Small acute infarcts seen in the deep left cerebral white matter and clustered at the left basal ganglia. Small acute high right frontal and left parietal cortex infarcts. No acute hemorrhage, hydrocephalus, or collection. Few remote white matter insults. Normal brain volume. Vascular: Arterial findings below Skull and upper cervical spine: No focal marrow lesion Sinuses/Orbits: Negative MRA HEAD FINDINGS Atheromatous irregularity of the carotid siphons with high-grade right supraclinoid ICA stenosis. There is a fetal  type right PCA with tandem high-grade P2 and P3 segment narrowings. There is high-grade narrowing at the dominant left V4 segment and moderate atheromatous irregularity of the basilar. The upper division M2 branch shows a focal high-grade stenosis. Recanalized left MCA with luminal irregularity from plaque or treated thrombus. Aplastic right A1 segment. Superiorly projecting aneurysm at the anterior communicating artery measuring 5 mm by CTA. IMPRESSION: 1. Few, tiny acute infarcts in the left MCA distribution, primarily at the basal ganglia. There is a punctate acute cortical infarct in the high right frontal lobe. 2. The treated left M1 segment remains patent with irregularity from atherosclerosis or treated thrombus. The upper division left M2 branch is severely stenotic with focal appearance likely  reflecting atherosclerosis. 3. Extensive intracranial atherosclerosis with dominant narrowings noted above. 4. 5 mm anterior communicating artery aneurysm. Electronically Signed   By: Marnee Spring M.D.   On: 06/21/2019 10:35   MR BRAIN WO CONTRAST  Result Date: 06/21/2019 CLINICAL DATA:  Right hemiplegia and aphasia. EXAM: MRI HEAD WITHOUT CONTRAST MRA HEAD WITHOUT CONTRAST TECHNIQUE: Multiplanar, multiecho pulse sequences of the brain and surrounding structures were obtained without intravenous contrast. Angiographic images of the head were obtained using MRA technique without contrast. COMPARISON:  None. FINDINGS: MRI HEAD FINDINGS Brain: Small acute infarcts seen in the deep left cerebral white matter and clustered at the left basal ganglia. Small acute high right frontal and left parietal cortex infarcts. No acute hemorrhage, hydrocephalus, or collection. Few remote white matter insults. Normal brain volume. Vascular: Arterial findings below Skull and upper cervical spine: No focal marrow lesion Sinuses/Orbits: Negative MRA HEAD FINDINGS Atheromatous irregularity of the carotid siphons with high-grade right  supraclinoid ICA stenosis. There is a fetal type right PCA with tandem high-grade P2 and P3 segment narrowings. There is high-grade narrowing at the dominant left V4 segment and moderate atheromatous irregularity of the basilar. The upper division M2 branch shows a focal high-grade stenosis. Recanalized left MCA with luminal irregularity from plaque or treated thrombus. Aplastic right A1 segment. Superiorly projecting aneurysm at the anterior communicating artery measuring 5 mm by CTA. IMPRESSION: 1. Few, tiny acute infarcts in the left MCA distribution, primarily at the basal ganglia. There is a punctate acute cortical infarct in the high right frontal lobe. 2. The treated left M1 segment remains patent with irregularity from atherosclerosis or treated thrombus. The upper division left M2 branch is severely stenotic with focal appearance likely reflecting atherosclerosis. 3. Extensive intracranial atherosclerosis with dominant narrowings noted above. 4. 5 mm anterior communicating artery aneurysm. Electronically Signed   By: Marnee Spring M.D.   On: 06/21/2019 10:35   US RENAL  Result Date: 06/25/2019 CLINICAL DATA:  Acute kidney injury EXAM: RENAL / URINARY TRACT ULTRASOUND COMPLETE COMPARISON:  None. FINDINGS: Right Kidney: Renal measurements: 12.4 x 6.4 x 6.5 cm = volume: 268.4 mL. Cortex appears slightly echogenic. Mild hydronephrosis. No focal mass. Mild perinephric fluid or edema Left Kidney: Renal measurements: 12 x 7 x 6.7 cm = volume: 293.7 mL. Cortex appears slightly echogenic. Mild left hydronephrosis. Small amount of left perinephric fluid or edema. Bladder: Appears normal for degree of bladder distention. Particulate debris in the bladder. Other: None. IMPRESSION: 1. Slightly echogenic kidneys bilaterally. There is mild bilateral hydronephrosis. 2. Trace perinephric edema/fluid 3. Distended urinary bladder with debris Electronically Signed   By: Jasmine Pang M.D.   On: 06/25/2019 17:22   IR CT  Head Ltd  Result Date: 06/20/2019 INDICATION: 64 year old male with a history of acute stroke, left M1 occlusion, presents for treatment EXAM: ULTRASOUND GUIDED ACCESS RIGHT COMMON FEMORAL ARTERY CERVICAL AND CEREBRAL ANGIOGRAM MECHANICAL THROMBECTOMY LEFT M1 ANGIO-SEAL FOR HEMOSTASIS COMPARISON:  CT IMAGING SAME DAY MEDICATIONS: None ANESTHESIA/SEDATION: The anesthesia team was present to provide general endotracheal tube anesthesia and for patient monitoring during the procedure. Interventional neuro radiology nursing staff was also present. CONTRAST:  60 cc Omnipaque 300 FLUOROSCOPY TIME:  Fluoroscopy Time: 3 minutes 56 seconds (965 mGy). COMPLICATIONS: 5 none TECHNIQUE: Informed written consent was obtained from the patient's family after a thorough discussion of the procedural risks, benefits and alternatives. Specific risks discussed include: Bleeding, infection, contrast reaction, kidney injury/failure, need for further procedure/surgery, arterial injury or dissection, embolization to new  territory, intracranial hemorrhage (10-15% risk), neurologic deterioration, cardiopulmonary collapse, death. All questions were addressed. Maximal Sterile Barrier Technique was utilized including during the procedure including caps, mask, sterile gowns, sterile gloves, sterile drape, hand hygiene and skin antiseptic. A timeout was performed prior to the initiation of the procedure. The anesthesia team was present to provide general endotracheal tube anesthesia and for patient monitoring during the procedure. Interventional neuro radiology nursing staff was also present. FINDINGS: Initial Findings: Left internal carotid artery: Mild irregular plaque of the internal carotid artery at the cavernous segment and supraclinoid segment without high-grade stenosis. Left MCA: There is an early temporal branch with frontal polar distribution. There is also flow into temporal branch with supplies the anterior middle and  posteroinferior temporal lobes. Occlusion of distal M1 segment, affecting the perfusion of frontal region, parietal region. Initial images demonstrate minimal leptomeningeal collateral flow from the ACA and temporal regions. Left ACA: Left ACA is patent. Minimal leptomeningeal collaterals into the frontal and parietal region. Anterior communicating artery is patent. The left A1 segment perfuses both the right and left ACA territory with patent anterior communicating artery. There does not appear to be any right A1 segment. Aneurysm of the anterior communicating artery. Completion Findings: Left MCA: After treatment with aspiration thrombectomy there is complete reperfusion of the left hemisphere. TICI 3 flow Flat panel CT demonstrates no intracranial hemorrhage or contrast stain. PROCEDURE: The anesthesia team was present to provide general endotracheal tube anesthesia and for patient monitoring during the procedure. Intubation was performed in negative pressure [place] in neuro IR holding. Interventional neuro radiology nursing staff was also present. Ultrasound survey of the right inguinal region was performed with images stored and sent to PACs. 11 blade scalpel was used to make a small incision. Blunt dissection was performed with US guidance. A micropuncture needle was used access the right common femoral artery under ultrasound. With excellent arterial blood flow returned, an .018 micro wire was passed through the needle, observed to enter the abdominal aorta under fluoroscopy. The needle was removed, and a micropuncture sheath was placed over the wire. The inner dilator and wire were removed, and an 035 wire was advanced under fluoroscopy into the abdominal aorta. The sheath was removed and a 25cm 71F straight vascular sheath was placed. The dilator was removed and the sheath was flushed. Sheath was attached to pressurized and heparinized saline bag for constant forward flow. A coaxial system was then advanced  over the 035 wire. This included a 95cm 087 "Walrus" balloon guide with coaxial 125cm Berenstein diagnostic catheter. This was advanced to the proximal descending thoracic aorta. Wire was then removed. Double flush of the catheter was performed. Catheter was then used to select the innominate artery. Angiogram was performed. Using roadmap technique, the catheter was advanced over a standard glide wire into left cervical ICA, with distal position achieved of the balloon guide. The diagnostic catheter and the wire were removed. Formal angiogram was performed. Road map function was used once the occluded vessel was identified. Copious back flush was performed and the balloon catheter was attached to heparinized and pressurized saline bag for forward flow. A second coaxial system was then advanced through the balloon catheter, which included the selected intermediate catheter, microcatheter, and microwire. In this scenario, the set up included a zoom 55 catheter, a 150 cm Trevo Provue18 microcatheter, and 014 synchro soft wire. This system was advanced through the balloon guide catheter under the road-map function, with adequate back-flush at the rotating hemostatic valve at  that back end of the balloon guide. Microcatheter and the intermediate catheter system were advanced through the terminal ICA and MCA to the level of the occlusion at the distal M1. The micro wire advanced through the occluded segment. The 55 cm catheter was then advanced on the microwire into the proximal M1 segment, and then the microcatheter and the microwire were slowly withdrawn. Aspiration of blood was confirmed at the hub of the aspiration catheter and then the catheter was attached to the proprietary aspiration engine. Aspiration was performed and free return of blood was confirmed. The catheter was then slowly advanced into the face of the thrombus and blood return. 2 minutes time interval was observed and then the catheter was withdrawn  into the proximal M1. There was no free return of blood. Upon withdrawal of the fifty-five zoom catheter, gentle aspiration was performed at the hub of the balloon guide. Intermediate catheter was entirely withdrawn from the balloon catheter. Free return of blood was confirmed at the hub of the balloon guide. Repeat angiogram was performed with multiple obliquity. Balloon guide was withdrawn Final angiogram of the cervical ICA performed. The skin at the puncture site was then cleaned with Chlorhexidine. The 8 French sheath was removed and an 67F angioseal was deployed. Flat panel CT was performed. Patient was extubated once the CT was reviewed. Patient tolerated the procedure well and remained hemodynamically stable throughout. No complications were encountered and no significant blood loss encountered. IMPRESSION: Status post ultrasound-guided access right common femoral artery for left-sided cervical and cerebral angiogram and treatment of left M1 occlusion with single pass of aspiration thrombectomy restoring complete flow through the M1. Angio-Seal for hemostasis. Signed, Yvone Neu. Reyne Dumas, RPVI [facility] Radiology PLAN: ICU status, [phone] Target systolic blood pressure of 120-140 Right hip straight time 6 hours Frequent neurovascular checks Repeat neurologic imaging with CT and/MRI at the discretion of neurology team Electronically Signed   By: Gilmer Mor D.O.   On: 06/20/2019 22:15   IR US Guide Vasc Access Right  Result Date: 06/20/2019 INDICATION: 64 year old male with a history of acute stroke, left M1 occlusion, presents for treatment EXAM: ULTRASOUND GUIDED ACCESS RIGHT COMMON FEMORAL ARTERY CERVICAL AND CEREBRAL ANGIOGRAM MECHANICAL THROMBECTOMY LEFT M1 ANGIO-SEAL FOR HEMOSTASIS COMPARISON:  CT IMAGING SAME DAY MEDICATIONS: None ANESTHESIA/SEDATION: The anesthesia team was present to provide general endotracheal tube anesthesia and for patient  monitoring during the procedure. Interventional neuro radiology nursing staff was also present. CONTRAST:  60 cc Omnipaque 300 FLUOROSCOPY TIME:  Fluoroscopy Time: 3 minutes 56 seconds (965 mGy). COMPLICATIONS: 5 none TECHNIQUE: Informed written consent was obtained from the patient's family after a thorough discussion of the procedural risks, benefits and alternatives. Specific risks discussed include: Bleeding, infection, contrast reaction, kidney injury/failure, need for further procedure/surgery, arterial injury or dissection, embolization to new territory, intracranial hemorrhage (10-15% risk), neurologic deterioration, cardiopulmonary collapse, death. All questions were addressed. Maximal Sterile Barrier Technique was utilized including during the procedure including caps, mask, sterile gowns, sterile gloves, sterile drape, hand hygiene and skin antiseptic. A timeout was performed prior to the initiation of the procedure. The anesthesia team was present to provide general endotracheal tube anesthesia and for patient monitoring during the procedure. Interventional neuro radiology nursing staff was also present. FINDINGS: Initial Findings: Left internal carotid artery: Mild irregular plaque of the internal carotid artery at the cavernous segment and supraclinoid segment without high-grade stenosis. Left MCA: There is an early temporal branch with frontal polar distribution.  There is also flow into temporal branch with supplies the anterior middle and posteroinferior temporal lobes. Occlusion of distal M1 segment, affecting the perfusion of frontal region, parietal region. Initial images demonstrate minimal leptomeningeal collateral flow from the ACA and temporal regions. Left ACA: Left ACA is patent. Minimal leptomeningeal collaterals into the frontal and parietal region. Anterior communicating artery is patent. The left A1 segment perfuses both the right and left ACA territory with patent anterior communicating  artery. There does not appear to be any right A1 segment. Aneurysm of the anterior communicating artery. Completion Findings: Left MCA: After treatment with aspiration thrombectomy there is complete reperfusion of the left hemisphere. TICI 3 flow Flat panel CT demonstrates no intracranial hemorrhage or contrast stain. PROCEDURE: The anesthesia team was present to provide general endotracheal tube anesthesia and for patient monitoring during the procedure. Intubation was performed in negative pressure [place] in neuro IR holding. Interventional neuro radiology nursing staff was also present. Ultrasound survey of the right inguinal region was performed with images stored and sent to PACs. 11 blade scalpel was used to make a small incision. Blunt dissection was performed with US guidance. A micropuncture needle was used access the right common femoral artery under ultrasound. With excellent arterial blood flow returned, an .018 micro wire was passed through the needle, observed to enter the abdominal aorta under fluoroscopy. The needle was removed, and a micropuncture sheath was placed over the wire. The inner dilator and wire were removed, and an 035 wire was advanced under fluoroscopy into the abdominal aorta. The sheath was removed and a 25cm 6F straight vascular sheath was placed. The dilator was removed and the sheath was flushed. Sheath was attached to pressurized and heparinized saline bag for constant forward flow. A coaxial system was then advanced over the 035 wire. This included a 95cm 087 "Walrus" balloon guide with coaxial 125cm Berenstein diagnostic catheter. This was advanced to the proximal descending thoracic aorta. Wire was then removed. Double flush of the catheter was performed. Catheter was then used to select the innominate artery. Angiogram was performed. Using roadmap technique, the catheter was advanced over a standard glide wire into left cervical ICA, with distal position achieved of the balloon  guide. The diagnostic catheter and the wire were removed. Formal angiogram was performed. Road map function was used once the occluded vessel was identified. Copious back flush was performed and the balloon catheter was attached to heparinized and pressurized saline bag for forward flow. A second coaxial system was then advanced through the balloon catheter, which included the selected intermediate catheter, microcatheter, and microwire. In this scenario, the set up included a zoom 55 catheter, a 150 cm Trevo Provue18 microcatheter, and 014 synchro soft wire. This system was advanced through the balloon guide catheter under the road-map function, with adequate back-flush at the rotating hemostatic valve at that back end of the balloon guide. Microcatheter and the intermediate catheter system were advanced through the terminal ICA and MCA to the level of the occlusion at the distal M1. The micro wire advanced through the occluded segment. The 55 cm catheter was then advanced on the microwire into the proximal M1 segment, and then the microcatheter and the microwire were slowly withdrawn. Aspiration of blood was confirmed at the hub of the aspiration catheter and then the catheter was attached to the proprietary aspiration engine. Aspiration was performed and free return of blood was confirmed. The catheter was then slowly advanced into the face of the thrombus and blood return.  2 minutes time interval was observed and then the catheter was withdrawn into the proximal M1. There was no free return of blood. Upon withdrawal of the fifty-five zoom catheter, gentle aspiration was performed at the hub of the balloon guide. Intermediate catheter was entirely withdrawn from the balloon catheter. Free return of blood was confirmed at the hub of the balloon guide. Repeat angiogram was performed with multiple obliquity. Balloon guide was withdrawn Final angiogram of the cervical ICA performed. The skin at the puncture site was  then cleaned with Chlorhexidine. The 8 French sheath was removed and an 56F angioseal was deployed. Flat panel CT was performed. Patient was extubated once the CT was reviewed. Patient tolerated the procedure well and remained hemodynamically stable throughout. No complications were encountered and no significant blood loss encountered. IMPRESSION: Status post ultrasound-guided access right common femoral artery for left-sided cervical and cerebral angiogram and treatment of left M1 occlusion with single pass of aspiration thrombectomy restoring complete flow through the M1. Angio-Seal for hemostasis. Signed, Yvone Neu. Reyne Dumas, RPVI [facility] Interventional Radiology Specialists [facility] Radiology PLAN: ICU status, [phone] Target systolic blood pressure of 120-140 Right hip straight time 6 hours Frequent neurovascular checks Repeat neurologic imaging with CT and/MRI at the discretion of neurology team Electronically Signed   By: Gilmer Mor D.O.   On: 06/20/2019 22:15   DG Chest Port 1 View  Result Date: 06/23/2019 CLINICAL DATA:  Leukocytosis EXAM: PORTABLE CHEST 1 VIEW COMPARISON:  Jun 21, 2019 FINDINGS: Lungs are clear. Heart is upper normal in size with pulmonary vascularity normal. No adenopathy. No bone lesions. IMPRESSION: Lungs clear.  Heart upper normal in size.  No adenopathy. Electronically Signed   By: Bretta Bang III M.D.   On: 06/23/2019 12:34   DG CHEST PORT 1 VIEW  Result Date: 06/21/2019 CLINICAL DATA:  Fever EXAM: PORTABLE CHEST 1 VIEW COMPARISON:  None. FINDINGS: Cardiomegaly. Negative aortic and hilar contours. There is no edema, consolidation, effusion, or pneumothorax. Interstitial crowding at the bases. IMPRESSION: 1. No evident pneumonia or significant atelectasis. 2. Cardiomegaly Electronically Signed   By: Marnee Spring M.D.   On: 06/21/2019 07:34   IR PERCUTANEOUS ART THROMBECTOMY/INFUSION INTRACRANIAL INC DIAG ANGIO  Result Date: 06/20/2019 INDICATION: 64 year old  male with a history of acute stroke, left M1 occlusion, presents for treatment EXAM: ULTRASOUND GUIDED ACCESS RIGHT COMMON FEMORAL ARTERY CERVICAL AND CEREBRAL ANGIOGRAM MECHANICAL THROMBECTOMY LEFT M1 ANGIO-SEAL FOR HEMOSTASIS COMPARISON:  CT IMAGING SAME DAY MEDICATIONS: None ANESTHESIA/SEDATION: The anesthesia team was present to provide general endotracheal tube anesthesia and for patient monitoring during the procedure. Interventional neuro radiology nursing staff was also present. CONTRAST:  60 cc Omnipaque 300 FLUOROSCOPY TIME:  Fluoroscopy Time: 3 minutes 56 seconds (965 mGy). COMPLICATIONS: 5 none TECHNIQUE: Informed written consent was obtained from the patient's family after a thorough discussion of the procedural risks, benefits and alternatives. Specific risks discussed include: Bleeding, infection, contrast reaction, kidney injury/failure, need for further procedure/surgery, arterial injury or dissection, embolization to new territory, intracranial hemorrhage (10-15% risk), neurologic deterioration, cardiopulmonary collapse, death. All questions were addressed. Maximal Sterile Barrier Technique was utilized including during the procedure including caps, mask, sterile gowns, sterile gloves, sterile drape, hand hygiene and skin antiseptic. A timeout was performed prior to the initiation of the procedure. The anesthesia team was present to provide general endotracheal tube anesthesia and for patient monitoring during the procedure. Interventional neuro radiology nursing staff was also present. FINDINGS: Initial Findings: Left internal carotid artery: Mild irregular  plaque of the internal carotid artery at the cavernous segment and supraclinoid segment without high-grade stenosis. Left MCA: There is an early temporal branch with frontal polar distribution. There is also flow into temporal branch with supplies the anterior middle and posteroinferior temporal lobes. Occlusion of distal M1 segment, affecting  the perfusion of frontal region, parietal region. Initial images demonstrate minimal leptomeningeal collateral flow from the ACA and temporal regions. Left ACA: Left ACA is patent. Minimal leptomeningeal collaterals into the frontal and parietal region. Anterior communicating artery is patent. The left A1 segment perfuses both the right and left ACA territory with patent anterior communicating artery. There does not appear to be any right A1 segment. Aneurysm of the anterior communicating artery. Completion Findings: Left MCA: After treatment with aspiration thrombectomy there is complete reperfusion of the left hemisphere. TICI 3 flow Flat panel CT demonstrates no intracranial hemorrhage or contrast stain. PROCEDURE: The anesthesia team was present to provide general endotracheal tube anesthesia and for patient monitoring during the procedure. Intubation was performed in negative pressure [place] in neuro IR holding. Interventional neuro radiology nursing staff was also present. Ultrasound survey of the right inguinal region was performed with images stored and sent to PACs. 11 blade scalpel was used to make a small incision. Blunt dissection was performed with US guidance. A micropuncture needle was used access the right common femoral artery under ultrasound. With excellent arterial blood flow returned, an .018 micro wire was passed through the needle, observed to enter the abdominal aorta under fluoroscopy. The needle was removed, and a micropuncture sheath was placed over the wire. The inner dilator and wire were removed, and an 035 wire was advanced under fluoroscopy into the abdominal aorta. The sheath was removed and a 25cm 58F straight vascular sheath was placed. The dilator was removed and the sheath was flushed. Sheath was attached to pressurized and heparinized saline bag for constant forward flow. A coaxial system was then advanced over the 035 wire. This included a 95cm 087 "Walrus" balloon guide with  coaxial 125cm Berenstein diagnostic catheter. This was advanced to the proximal descending thoracic aorta. Wire was then removed. Double flush of the catheter was performed. Catheter was then used to select the innominate artery. Angiogram was performed. Using roadmap technique, the catheter was advanced over a standard glide wire into left cervical ICA, with distal position achieved of the balloon guide. The diagnostic catheter and the wire were removed. Formal angiogram was performed. Road map function was used once the occluded vessel was identified. Copious back flush was performed and the balloon catheter was attached to heparinized and pressurized saline bag for forward flow. A second coaxial system was then advanced through the balloon catheter, which included the selected intermediate catheter, microcatheter, and microwire. In this scenario, the set up included a zoom 55 catheter, a 150 cm Trevo Provue18 microcatheter, and 014 synchro soft wire. This system was advanced through the balloon guide catheter under the road-map function, with adequate back-flush at the rotating hemostatic valve at that back end of the balloon guide. Microcatheter and the intermediate catheter system were advanced through the terminal ICA and MCA to the level of the occlusion at the distal M1. The micro wire advanced through the occluded segment. The 55 cm catheter was then advanced on the microwire into the proximal M1 segment, and then the microcatheter and the microwire were slowly withdrawn. Aspiration of blood was confirmed at the hub of the aspiration catheter and then the catheter was attached to the  proprietary aspiration engine. Aspiration was performed and free return of blood was confirmed. The catheter was then slowly advanced into the face of the thrombus and blood return. 2 minutes time interval was observed and then the catheter was withdrawn into the proximal M1. There was no free return of blood. Upon withdrawal  of the fifty-five zoom catheter, gentle aspiration was performed at the hub of the balloon guide. Intermediate catheter was entirely withdrawn from the balloon catheter. Free return of blood was confirmed at the hub of the balloon guide. Repeat angiogram was performed with multiple obliquity. Balloon guide was withdrawn Final angiogram of the cervical ICA performed. The skin at the puncture site was then cleaned with Chlorhexidine. The 8 French sheath was removed and an 3F angioseal was deployed. Flat panel CT was performed. Patient was extubated once the CT was reviewed. Patient tolerated the procedure well and remained hemodynamically stable throughout. No complications were encountered and no significant blood loss encountered. IMPRESSION: Status post ultrasound-guided access right common femoral artery for left-sided cervical and cerebral angiogram and treatment of left M1 occlusion with single pass of aspiration thrombectomy restoring complete flow through the M1. Angio-Seal for hemostasis. Signed, Yvone Neu. Reyne Dumas, RPVI Vascular and Interventional Radiology Specialists [facility] Radiology PLAN: ICU status, [phone] Target systolic blood pressure of 120-140 Right hip straight time 6 hours Frequent neurovascular checks Repeat neurologic imaging with CT and/MRI at the discretion of neurology team Electronically Signed   By: Gilmer Mor D.O.   On: 06/20/2019 22:15   CT HEAD CODE STROKE WO CONTRAST  Result Date: 06/20/2019 CLINICAL DATA:  Code stroke. 64 year old male with right side weakness. EXAM: CT HEAD WITHOUT CONTRAST TECHNIQUE: Contiguous axial images were obtained from the base of the skull through the vertex without intravenous contrast. COMPARISON:  None. FINDINGS: Brain: Cerebral volume is within normal limits for age. No midline shift, ventriculomegaly, mass effect, evidence of mass lesion, intracranial hemorrhage or evidence of cortically based acute infarction. Gray-white matter differentiation  is within normal limits throughout the brain. Vascular: No suspicious intracranial vascular hyperdensity. Skull: No acute osseous abnormality identified. Sinuses/Orbits: Visualized paranasal sinuses and mastoids are clear. Other: Mild left gaze deviation. Visualized scalp soft tissues are within normal limits. ASPECTS [facility] Stroke Program Early CT Score) Total score (0-10 with 10 being normal): 10 IMPRESSION: 1. Normal for age non contrast CT appearance of the brain. ASPECTS 10. 2. These results were communicated to Dr. Otelia Limes at 7:45 pm on 06/20/2019 by text page via the [facility] messaging system. Electronically Signed   By: Odessa Fleming M.D.   On: 06/20/2019 19:45   CT ANGIO HEAD CODE STROKE  Result Date: 06/20/2019 CLINICAL DATA:  64 year old male code stroke presentation with right side weakness. EXAM: CT ANGIOGRAPHY HEAD AND NECK TECHNIQUE: Multidetector CT imaging of the head and neck was performed using the standard protocol during bolus administration of intravenous contrast. Multiplanar CT image reconstructions and MIPs were obtained to evaluate the vascular anatomy. Carotid stenosis measurements (when applicable) are obtained utilizing NASCET criteria, using the distal internal carotid diameter as the denominator. CONTRAST:  75mL OMNIPAQUE IOHEXOL 350 MG/ML SOLN COMPARISON:  Head CT 1939 hours today. FINDINGS: CTA NECK Skeleton: No acute osseous abnormality identified. Age-appropriate cervical spine degeneration. Upper chest: Negative; patent visible central pulmonary arteries. Other neck: Negative, no acute findings. Aortic arch: 3 vessel arch configuration with mild to moderate mostly soft thoracic aorta plaque. Right carotid system: Brachiocephalic artery and right CCA origin are patent without stenosis despite some  plaque. Tortuous proximal right CCA. Minimal plaque at the right ICA origin with no stenosis to the skull base. Left carotid system: Minimal plaque at the left CCA and left ICA origins.  Tortuous left ICA distal to the bulb. No stenosis to the skull base. Vertebral arteries: No significant proximal right subclavian artery stenosis despite soft plaque. Patent right vertebral artery origin without stenosis. Non dominant appearing right vertebral artery remains patent to the skull base. No proximal left subclavian artery stenosis despite plaque. Normal left vertebral artery origin. Tortuous left V1 segment. Mild to moderate stenosis in the left V2 segment on series 7, image 238 presumably due to soft plaque. Otherwise the left vertebral is patent to the skull base. CTA HEAD Early intracranial contrast timing. Posterior circulation: Patent PICA origins which occurred early. The left V4 segment appears dominant however, there is a severe distal V4 stenosis on the left (series 11, image 28 and series 7, image 147. No right V4 stenosis although that side is non dominant. Patent vertebrobasilar junction. Patent although highly diminutive basilar artery, and moderate distal basilar stenosis proximal to the SCA origins (series 11, image 26 and series 12, image 22. SCA origins remain patent. Left PCA origin is patent. Fetal type right PCA origin. Bilateral PCA branches are diminutive. Those on the left are within normal limits. There is additional severe stenosis of the right PCA P2 segment on series 10, image 23. Anterior circulation: Both ICA siphons are patent. And there is no left siphon plaque or stenosis. On the right side there is mild supraclinoid ICA stenosis. Carotid termini remain patent. MCA and left ACA origins are patent. The right A1 appears to be diminutive or absent. The anterior communicating artery is patent although there is a saccular 5 mm aneurysm of the anterior communicating artery directed anteriorly and superiorly (series 7, image 100 and series 11, image 23). Otherwise mild irregularity of the ACA branches. The right MCA M1 segment and bifurcation are patent. Right MCA branches  appear patent with mild to moderate irregularity (series 12, image 15). The left MCA M1 occludes distal to the anterior temporal artery origin as seen on series 10, image 23. There is low to intermediate reconstitution of left MCA branches. Venous sinuses: Not evaluated due to early contrast timing. Anatomic variants: Dominant left vertebral artery. Fetal type right PCA origin. Dominant left and diminutive or absent right ACA A1 segments. Review of the MIP images confirms the above findings IMPRESSION: 1. Positive for Left MCA M1 large vessel occlusion. 2. 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 3. Preliminary results of the above were communicated to Dr. Otelia Limes at 8:02 pm by text page via the [facility] messaging system. 4. Additionally, the study is positive for: - Severe stenosis of the Dominant Left Vertebral Artery V4 segment, - Moderate distal Basilar Artery stenosis, - severe Right PCA P2 stenosis, - mild to moderate Right MCA branch irregularity. Final results were communicated to Dr. Otelia Limes at 8:13 pm on 06/20/2019 by text page via the [facility] messaging system. Electronically Signed   By: Odessa Fleming M.D.   On: 06/20/2019 20:14      HISTORY OF PRESENT ILLNESS Lathen Seal is an 64 y.o. male with a history of HTN and a-fib, on Eliquis, who presents via EMS as a Code Stroke after acute onset of right hemiplegia and aphasia while eating at a restaurant this evening. Time of onset is the same as LKW: 1845. He was witnessed by family  to become non-verbal and not acting right, as well as unable to follow instructions. He has no prior history of stroke. He was not a tPA candidate as he was on eliquis. Found to have a L M1 LVO. Sent to IR for mechanical thrombectomy.   HOSPITAL COURSE Mr. Hosteen Rock is a 64 y.o. male with history of HTN anda-fib, on Eliquis, who presents via EMS as a Code Stroke after acute onset of right hemiplegia, aphasia and unable to follow  instructions. while eating at a restaurant this evening. On Eliquis so no tPA. IR - left M1 occlusion with single pass of aspiration thrombectomy restoring complete flow through the M1.  Stroke: Scattered punctate infarcts left MCA territory due to left M1 occlusion s/p IR with TICI3 reperfusion, embolic due to A. fib even on Eliquis. Large vessel disease also possible given CTA/MRA findings  CT Head - Normal for age non contrast CT appearance of the brain. ASPECTS 10.   CTA H&N - Positive for Left MCA M1 large vessel occlusion. Additionally, the study is positive for: - Severe stenosis of the Dominant Left Vertebral Artery V4 segment, - Moderate distal Basilar Artery stenosis, - severe Right PCA P2 stenosis, - mild to moderate Right MCA branch irregularity.  MRI head - Few, tiny acute infarcts in the left MCA distribution, primarily at the basal ganglia. There is a punctate acute cortical infarct in the high right frontal lobe.  MRA head - treated left M1 segment remains patent with irregularity from atherosclerosis or treated thrombus. The upper division left M2 branch is severely stenotic with focal appearance likely reflecting atherosclerosis. high-grade right supraclinoid ICA stenosis. tandem high-grade P2 and P3 segment narrowings  2D Echo - 04/01/19 - EF 60 - 65%. No cardiac source of emboli identified.   Sars Corona Virus 2 - negative  LDL - 143  HgbA1c - 6.0  UDS - negative  Eliquis (apixaban) daily prior to admission, treated with IV heparin in the hospital with plans to d/c on warfarin with lovenox bridge. However, given d/c on Friday of a holiday weekend and no availability to check INR until Tues, concern with use of warfarin in this setting. Will discharge on Xarelto [facility] pharmacy can provide 30d free) with plans to transition to warfarin with lovenox bridge in 1 month (which hopefully will align with aneurysmal repair and can be transitioned to warfarin following that procedure).  If procedure delayed, transition as an OP. Discussed w/ Velva Harman PA. She will notify stroke team at time of repair.  Therapy recommendations: outpt PT/OT  Disposition:  return home with family  ACOM aneurysm  CTA head and neck positive also for 5 mm Saccular Aneurysm of the ACOM.  MRA head confirmed the finding  planned coiling this admission, canceled  given UTI and renal failure  Follow up as an OP and hope to reschule in 1 month. Baldemar Lenis MD (Interventional Neuroradiologist) to arrange  Afib with bradycardia  HR 40s  Follows with Dr. Tawanna Cooler Cardiology in [place]  On Eliquis PTA  Treated with heparin IV in hospital once stable  Due to cost, plan to warfarin with INR 2-3 for long term AC following aneurysm repair in 1 month (given d/c on Friday of a holiday weekend and no availability to check INR until Tues, concern with use of warfarin in this setting. Will discharge on Xarelto [facility] pharmacy can provide 30d free) with plans to transition to warfarin with lovenox bridge in 1 month (which hopefully will  align with aneurysmal repair and can be transitioned to warfarin following that procedure). If procedure delayed, transition as an OP.) Discussed w/ Velva Harman PA. She will notify stroke team at time of repair.  Close monitor heart rate  Did not resume home Coreg  Hypertension  Home BP meds: amlodipine ; HCTZ ; Zestril ; Coreg  Treated with cleviprex in [facility]  BP 120-130s day of discharge off home meds  Will resume amlodipine at 2.5, holding other meds d/t near normal BP and AKI  Follow up PCP with BP med resumption based on need as pt stabilizes  BP goal normotensive  Hyperlipidemia  Home Lipid lowering medication: none  LDL 143, goal < 70  Current lipid lowering medication: Lipitor 40  Continue statin at discharge  Other Stroke Risk Factors  Advanced age  Obesity, recommend weight loss, diet and exercise as appropriate    Other Active Problems  Leukocytosis - 18.9->14.5->16.5->28.3-33-19 (afebrile).-33-19 UA and CXR 2 days ago were both ok. Will repeat 2V CXR, UA and Cx. Repeat labs in am.   AKI secondary to contrast induced nephropathy vs urinary retention from obstruction - creatinine - 1.19 - resolved. Flomax 0.4 mg daily. Avoid nephrotoxic agents  Hypokalemia - 3.4 - supplement  Complicated UTI. Treated with rocephin in hospital. D/c on Keflex 500 mg TID x 3 days.  DISCHARGE EXAM Blood pressure 131/79, pulse 83, temperature 98.9 F (37.2 C), temperature source Oral, resp. rate 18, weight 76.2 kg, SpO2 100 %. General - Well nourished, well developed, in no apparent distress.  Ophthalmologic - fundi not visualized due to noncooperation.  Cardiovascular - irregularly irregular heart rate and rhythm    Mental Status -  Level of arousal and orientation to time, place, and person were intact. Language including expression, naming, repetition, comprehension was assessed and found intact.  Cranial Nerves II - XII - II - Visual field intact OU. III, IV, VI - Extraocular movements intact. V - Facial sensation intact bilaterally. VII - Facial movement intact bilaterally. VIII - Hearing & vestibular intact bilaterally. X - Palate elevates symmetrically. XI - Chin turning & shoulder shrug intact bilaterally. XII - Tongue protrusion intact.  Motor Strength - The patient's strength was normal in all extremities and pronator drift was absent.  Bulk was normal and fasciculations were absent.   Motor Tone - Muscle tone was assessed at the neck and appendages and was normal.  Diminished fine finger movements on the right.  Orbits left over right upper extremity.  Mild weakness of right grip  Reflexes - The patient's reflexes were symmetrical in all extremities and he had no pathological reflexes.  Sensory - Light touch, temperature/pinprick were assessed and were symmetrical.    Coordination -  The patient had normal movements in the hands and feet with no ataxia or dysmetria.  Tremor was absent.  Gait and Station - deferred.  Discharge Diet   Regular thin liquids  DISCHARGE PLAN  Disposition:  Home with family  Xarelto (rivaroxaban) daily for secondary stroke prevention for now. Plan transition to warfarin with lovenox bridge following aneurysm repair. If repair delayed > 1 mo, may transition once 1 mo Xarelto completed if unable to afford. (see details above)  Ongoing stroke risk factor control by Primary Care Physician at time of discharge  Monitor and resume BP meds as needed for normotensive goal  Follow-up PCP Dema Severin, NP Tues June 1, [address] at 0930  Follow-up [facility] July 16, 2019 at 1130a  Follow-up Jerilynn Mages  de Melchor Amour MD (Interventional Neuroradiologist)  - aneurysm repair - office will call with appt date and time  Follow-up in [facility] in 6 weeks, office to schedule an appointment.   45 minutes were spent preparing discharge.  Annie Main, MSN, APRN, ANVP-BC, AGPCNP-BC Advanced Practice Stroke Nurse [facility] See Amion for Schedule & Pager information 06/26/2019 3:06 PM   I have personally obtained history,examined this patient, reviewed notes, independently viewed imaging studies, participated in medical decision making and plan of care.ROS completed by me personally and pertinent positives fully documented  I have made any additions or clarifications directly to the above note. Agree with note above.  Delia Heady, MD Medical Director [facility] Pager: [phone] 06/26/2019 3:20 PM

## 2019-06-26 NOTE — Discharge Instructions (Addendum)

## 2019-06-26 NOTE — Progress Notes (Signed)
MEDICAL CONSULT FOLLOW UP PROGRESS NOTE        PATIENT DETAILS Name: Blake Carey Age: 64 y.o. Sex: male Date of Birth: Jan 22, 1956 Admit Date: 06/20/2019 Admitting Physician Caryl Pina, MD IOM:BTDH, Orie Rout, NP  Reason for consultation: Evaluation of leukocytosis and AKI  Requesting physician: Dr. Pearlean Brownie  Brief Narrative: Patient is a 63 y.o. male with h/o HTN and afib on Eliquis who presented on 5/22 with embolic CVA treated with IR reperfusion-medical consultation requested for evaluation of AKI and leukocytosis.  Subjective: Had an episode of acute urinary retention requiring in and out catheterization last night-he however claims that he has had no problems urinating since then.  Claims that for the past 2-3 days-has had to strain to urinate, at baseline he has 2-3 episodes of nocturia.  (Science writer at bedside utilized for history)  Assessment/Plan: AKI: Likely secondary to contrast-induced nephropathy-with some component from obstructive uropathy.  Renal function has improved with supportive care-he is euvolemic-does not require any further IV fluids.  Suspect obstructive uropathy from underlying BPH-has been started on Flomax.  Only minimal hydronephrosis seen on renal ultrasound.  Needs outpatient follow-up with PCP for possible referral to urology if no improvement in his BPH symptoms and also for repeat chemistry panel in 1 week.   Agree with Dr. Wynn Maudlin to postpone cerebral angiogram (nonurgent) given episode of resolving AKI secondary to contrast nephropathy-probably prudent to consider a outpatient elective cerebral angiogram once renal function has improved further.  Leukocytosis: Resolved.  Secondary to UTI.   Complicated UTI: Pansensitive E. coli on urine culture-transition to Keflex 500 mg 3 times daily  Rest of the issues defered to the primary service.  Okay to discharge from my perspective.TRH will sign out-please call  prn  Medications recommended for discharge: 1. Flomax 0.4 mg daily 2. Keflex 500 mg TID x 3 days.   Diet: Diet Order            Diet NPO time specified  Diet effective midnight               Code Status: Full code   Disposition Plan: Per primary service  Antimicrobial agents: Anti-infectives (From admission, onward)   Start     Dose/Rate Route Frequency Ordered Stop   06/23/19 1700  cefTRIAXone (ROCEPHIN) 2 g in sodium chloride 0.9 % 100 mL IVPB     2 g 200 mL/hr over 30 Minutes Intravenous Every 24 hours 06/23/19 1655         Time spent: 25 minutes-Greater than 50% of this time was spent in counseling, explanation of diagnosis, planning of further management, and coordination of care.  MEDICATIONS: Scheduled Meds: . atorvastatin  40 mg Oral q1800  . Chlorhexidine Gluconate Cloth  6 each Topical Q0600  . pantoprazole  40 mg Oral Daily  . tamsulosin  0.4 mg Oral Daily   Continuous Infusions: . sodium chloride 75 mL/hr at 06/26/19 0203  . cefTRIAXone (ROCEPHIN)  IV Stopped (06/26/19 0114)  . heparin 1,050 Units/hr (06/26/19 0606)   PRN Meds:.acetaminophen **OR** acetaminophen (TYLENOL) oral liquid 160 mg/5 mL **OR** acetaminophen, hydrALAZINE, senna-docusate, simethicone   PHYSICAL EXAM: Vital signs: Vitals:   06/26/19 0012 06/26/19 0446 06/26/19 0809 06/26/19 0900  BP: (!) 124/58 116/73 127/66   Pulse: 100 92 64   Resp: 18 18 16    Temp: 98.8 F (37.1 C) 98.4 F (36.9 C) 98.2 F (36.8 C)   TempSrc: Oral  Oral Oral   SpO2: 98% 99% 100%   Weight:    76.2 kg   Filed Weights   06/26/19 0900  Weight: 76.2 kg   Body mass index is 29.76 kg/m.   Gen Exam:Alert awake-not in any distress HEENT:atraumatic, normocephalic Chest: B/L clear to auscultation anteriorly CVS:S1S2 regular Abdomen:soft non tender, non distended Extremities:no edema Neurology: Non focal Skin: no rash  I have personally reviewed following labs and imaging studies  LABORATORY  DATA: CBC: Recent Labs  Lab 06/20/19 1935 06/21/19 1027 06/22/19 0509 06/23/19 0647 06/24/19 0353 06/25/19 0220 06/26/19 0349  WBC 18.9*   < > 16.5* 28.3* 33.9* 16.9* 9.8  NEUTROABS 14.1*  --   --  24.8* 28.4* 14.2*  --   HGB 14.6   < > 13.1 14.6 13.4 12.3* 11.3*  HCT 45.9   < > 41.6 45.1 41.9 37.7* 35.1*  MCV 82.9   < > 83.2 81.7 82.0 81.1 81.8  PLT 259   < > 183 195 170 151 129*   < > = values in this interval not displayed.    Basic Metabolic Panel: Recent Labs  Lab 06/22/19 0509 06/23/19 0647 06/24/19 0353 06/25/19 0220 06/26/19 0747  NA 137 136 135 134* 135  K 3.9 3.4* 3.6 3.7 3.9  CL 104 104 106 104 107  CO2 26 20* 19* 21* 18*  GLUCOSE 84 100* 96 96 88  BUN 19 15 33* 47* 38*  CREATININE 1.01 1.19 3.02* 3.40* 1.86*  CALCIUM 8.2* 8.5* 7.9* 7.8* 7.8*    GFR: Estimated Creatinine Clearance: 35.7 mL/min (A) (by C-G formula based on SCr of 1.86 mg/dL (H)).  Liver Function Tests: Recent Labs  Lab 06/20/19 1935 06/24/19 0353  AST 29 30  ALT 67* 31  ALKPHOS 57 64  BILITOT 1.0 1.0  PROT 6.6 5.5*  ALBUMIN 3.5 2.4*   No results for input(s): LIPASE, AMYLASE in the last 168 hours. No results for input(s): AMMONIA in the last 168 hours.  Coagulation Profile: Recent Labs  Lab 06/22/19 0509 06/23/19 0647 06/24/19 0353 06/25/19 0220 06/26/19 0349  INR 1.3* 1.3* 1.3* 1.4* 1.3*    Cardiac Enzymes: No results for input(s): CKTOTAL, CKMB, CKMBINDEX, TROPONINI in the last 168 hours.  BNP (last 3 results) No results for input(s): PROBNP in the last 8760 hours.  Lipid Profile: No results for input(s): CHOL, HDL, LDLCALC, TRIG, CHOLHDL, LDLDIRECT in the last 72 hours.  Thyroid Function Tests: No results for input(s): TSH, T4TOTAL, FREET4, T3FREE, THYROIDAB in the last 72 hours.  Anemia Panel: No results for input(s): VITAMINB12, FOLATE, FERRITIN, TIBC, IRON, RETICCTPCT in the last 72 hours.  Urine analysis:    Component Value Date/Time   COLORURINE  YELLOW 06/23/2019 1500   APPEARANCEUR CLOUDY (A) 06/23/2019 1500   LABSPEC 1.011 06/23/2019 1500   PHURINE 6.0 06/23/2019 1500   GLUCOSEU NEGATIVE 06/23/2019 1500   HGBUR LARGE (A) 06/23/2019 1500   BILIRUBINUR NEGATIVE 06/23/2019 1500   KETONESUR 5 (A) 06/23/2019 1500   PROTEINUR NEGATIVE 06/23/2019 1500   NITRITE NEGATIVE 06/23/2019 1500   LEUKOCYTESUR LARGE (A) 06/23/2019 1500    Sepsis Labs: Lactic Acid, Venous No results found for: LATICACIDVEN  MICROBIOLOGY: Recent Results (from the past 240 hour(s))  SARS Coronavirus 2 by RT PCR (hospital order, performed in [place] hospital lab) Nasopharyngeal Nasopharyngeal Swab     Status: None   Collection Time: 06/20/19  7:40 PM   Specimen: Nasopharyngeal Swab  Result Value Ref Range Status   SARS  Coronavirus 2 NEGATIVE NEGATIVE Final    Comment: (NOTE) SARS-CoV-2 target nucleic acids are NOT DETECTED. The SARS-CoV-2 RNA is generally detectable in upper and lower respiratory specimens during the acute phase of infection. The lowest concentration of SARS-CoV-2 viral copies this assay can detect is 250 copies / mL. A negative result does not preclude SARS-CoV-2 infection and should not be used as the sole basis for treatment or other patient management decisions.  A negative result may occur with improper specimen collection / handling, submission of specimen other than nasopharyngeal swab, presence of viral mutation(s) within the areas targeted by this assay, and inadequate number of viral copies (<250 copies / mL). A negative result must be combined with clinical observations, patient history, and epidemiological information. Fact Sheet for Patients:   BoilerBrush.com.cy Fact Sheet for Healthcare Providers: https://pope.com/ This test is not yet approved or cleared  by the [place] FDA and has been authorized for detection and/or diagnosis of SARS-CoV-2 by FDA under an  Emergency Use Authorization (EUA).  This EUA will remain in effect (meaning this test can be used) for the duration of the COVID-19 declaration under Section 564(b)(1) of the Act, 21 U.S.C. section 360bbb-3(b)(1), unless the authorization is terminated or revoked sooner. Performed at [facility], [address] [place], [place] [address]   Urine Culture     Status: Abnormal   Collection Time: 06/23/19 11:49 AM   Specimen: Urine, Random  Result Value Ref Range Status   Specimen Description URINE, RANDOM  Final   Special Requests   Final    NONE Performed at [facility], [address] [place], [place] [address]    Culture >=100,000 COLONIES/mL ESCHERICHIA COLI (A)  Final   Report Status 06/25/2019 FINAL  Final   Organism ID, Bacteria ESCHERICHIA COLI (A)  Final      Susceptibility   Escherichia coli - MIC*    AMPICILLIN <=2 SENSITIVE Sensitive     CEFAZOLIN <=4 SENSITIVE Sensitive     CEFTRIAXONE <=1 SENSITIVE Sensitive     CIPROFLOXACIN <=0.25 SENSITIVE Sensitive     GENTAMICIN <=1 SENSITIVE Sensitive     IMIPENEM <=0.25 SENSITIVE Sensitive     NITROFURANTOIN <=16 SENSITIVE Sensitive     TRIMETH/SULFA <=20 SENSITIVE Sensitive     AMPICILLIN/SULBACTAM <=2 SENSITIVE Sensitive     PIP/TAZO <=4 SENSITIVE Sensitive     * >=100,000 COLONIES/mL ESCHERICHIA COLI  Surgical pcr screen     Status: None   Collection Time: 06/26/19  4:28 AM   Specimen: Nasal Mucosa; Nasal Swab  Result Value Ref Range Status   MRSA, PCR NEGATIVE NEGATIVE Final   Staphylococcus aureus NEGATIVE NEGATIVE Final    Comment: (NOTE) The Xpert SA Assay (FDA approved for NASAL specimens in patients 58 years of age and older), is one component of a comprehensive surveillance program. It is not intended to diagnose infection nor to guide or monitor treatment. Performed at [facility], [address] [place], [place] [address]     RADIOLOGY STUDIES/RESULTS: US RENAL  Result Date:  06/25/2019 CLINICAL DATA:  Acute kidney injury EXAM: RENAL / URINARY TRACT ULTRASOUND COMPLETE COMPARISON:  None. FINDINGS: Right Kidney: Renal measurements: 12.4 x 6.4 x 6.5 cm = volume: 268.4 mL. Cortex appears slightly echogenic. Mild hydronephrosis. No focal mass. Mild perinephric fluid or edema Left Kidney: Renal measurements: 12 x 7 x 6.7 cm = volume: 293.7 mL. Cortex appears slightly echogenic. Mild left hydronephrosis. Small amount of left perinephric  fluid or edema. Bladder: Appears normal for degree of bladder distention. Particulate debris in the bladder. Other: None. IMPRESSION: 1. Slightly echogenic kidneys bilaterally. There is mild bilateral hydronephrosis. 2. Trace perinephric edema/fluid 3. Distended urinary bladder with debris Electronically Signed   By: Jasmine Pang M.D.   On: 06/25/2019 17:22     LOS: 6 days   Jeoffrey Massed, MD  [facility]    To contact the attending provider between 7A-7P or the covering provider during after hours 7P-7A, please log into the web site [email or web address] and access using universal [place] password for that web site. If you do not have the password, please call the hospital operator.  06/26/2019, 9:14 AM

## 2019-06-26 NOTE — Progress Notes (Signed)
ANTICOAGULATION CONSULT NOTE  Pharmacy Consult for Heparin  Indication: stroke  No Known Allergies  Patient Measurements: Weight: 76.2 kg (168 lb) Actual body weight: 76.2 kg Ideal body weight: 56.9 kg (125 lb 7.1 oz) Adjusted ideal body weight: 64.6 kg (142 lb 7.4 oz)   Heparin Dosing Weight: 72.5 kg  Vital Signs: Temp: 98.9 F (37.2 C) (05/28 1142) Temp Source: Oral (05/28 1142) BP: 131/79 (05/28 1142) Pulse Rate: 83 (05/28 1142)  Labs: Recent Labs    06/24/19 0353 06/24/19 0353 06/25/19 0220 06/25/19 1025 06/25/19 2052 06/26/19 0349 06/26/19 0747 06/26/19 1341  HGB 13.4   < > 12.3*  --   --  11.3*  --   --   HCT 41.9  --  37.7*  --   --  35.1*  --   --   PLT 170  --  151  --   --  129*  --   --   LABPROT 16.0*  --  16.2*  --   --  15.5*  --   --   INR 1.3*  --  1.4*  --   --  1.3*  --   --   HEPARINUNFRC 0.47   < > 0.29*   < > 0.24* 0.24*  --  0.29*  CREATININE 3.02*  --  3.40*  --   --   --  1.86*  --    < > = values in this interval not displayed.    Estimated Creatinine Clearance: 35.7 mL/min (A) (by C-G formula based on SCr of 1.86 mg/dL (H)).  Assessment: 61 YOM who presented on 5/22 w/ R-hemiplegia and aphasia and found to have an acute stroke. The patient was on Apixaban PTA for hx Afib. Given the new stroke - pharmacy has been consulted to transition to warfarin with a heparin bridge.  Warfarin currently on hold due to possible procedure  Heparin level barely subtherapeutic (0.29) on gtt at 1050 units/hr. RN switched heparin to different line so no issues overnight with beeping/line being kinked. Hemoglobin and plt trending down.  Goal of Therapy:  Heparin level 0.3-0.5 units/ml Monitor platelets by anticoagulation protocol: Yes   Plan:  Increase heparin to 1100 units/hr  Monitor daily HL, CBC/plt Monitor for signs/symptoms of bleeding  Planning discharge soon, possibly on rivaroxaban     Alphia Moh, PharmD, BCPS, BCCP Clinical  Pharmacist  Please check AMION for all [facility] phone numbers After 10:00 PM, call Main Pharmacy [phone]

## 2019-06-26 NOTE — Progress Notes (Signed)
Physical Therapy Treatment Patient Details Name: Blake Carey MRN: [id number] DOB: 24-Nov-1955 Today's Date: 06/26/2019    History of Present Illness 64 y.o male admitted for stroke work up after R hemiplegia and aphasia. Imaging positive for Left MCA M1 large vessel occlusion. PMH includes HTN and a-fib, on Eliquis    PT Comments    Patient seen for PT treatment. Current plan remains appropriate.    Follow Up Recommendations  Outpatient PT     Equipment Recommendations  None recommended by PT    Recommendations for Other Services       Precautions / Restrictions Precautions Precautions: Fall Restrictions Weight Bearing Restrictions: No    Mobility  Bed Mobility Overal bed mobility: Independent                Transfers Overall transfer level: Modified independent Equipment used: None Transfers: Sit to/from Stand              Ambulation/Gait Ambulation/Gait assistance: Min guard;Supervision   Assistive device: None Gait Pattern/deviations: Step-through pattern;Drifts right/left     General Gait Details: decreased cadence and drifting/narrowing BOS with horizontal head turns; no overt LOB   Stairs             Wheelchair Mobility    Modified Rankin (Stroke Patients Only) Modified Rankin (Stroke Patients Only) Pre-Morbid Rankin Score: No symptoms Modified Rankin: Slight disability     Balance Overall balance assessment: Needs assistance Sitting-balance support: No upper extremity supported;Feet supported Sitting balance-Leahy Scale: Good     Standing balance support: No upper extremity supported;During functional activity Standing balance-Leahy Scale: Good                              Cognition Arousal/Alertness: Awake/alert Behavior During Therapy: WFL for tasks assessed/performed Overall Cognitive Status: Within Functional Limits for tasks assessed                                         Exercises      General Comments        Pertinent Vitals/Pain Pain Assessment: No/denies pain    Home Living                      Prior Function            PT Goals (current goals can now be found in the care plan section) Progress towards PT goals: Progressing toward goals    Frequency    Min 4X/week      PT Plan Current plan remains appropriate    Co-evaluation              AM-PAC PT "6 Clicks" Mobility   Outcome Measure  Help needed turning from your back to your side while in a flat bed without using bedrails?: None Help needed moving from lying on your back to sitting on the side of a flat bed without using bedrails?: None Help needed moving to and from a bed to a chair (including a wheelchair)?: None Help needed standing up from a chair using your arms (e.g., wheelchair or bedside chair)?: None Help needed to walk in hospital room?: None Help needed climbing 3-5 steps with a railing? : A Little 6 Click Score: 23    End of Session   Activity Tolerance: Patient tolerated treatment well Patient left: with  call bell/phone within reach;in bed;with family/visitor present Nurse Communication: Mobility status PT Visit Diagnosis: Other abnormalities of gait and mobility (R26.89);Other symptoms and signs involving the nervous system (R29.898)     Time: 8563-1497 PT Time Calculation (min) (ACUTE ONLY): 13 min  Charges:  $Gait Training: 8-22 mins                     Earney Navy, PTA Acute Rehabilitation Services Pager: [phone] Office: [phone]     Darliss Cheney 06/26/2019, 4:42 PM

## 2019-07-09 ENCOUNTER — Other Ambulatory Visit (HOSPITAL_COMMUNITY): Payer: Self-pay | Admitting: Neuroradiology

## 2019-07-09 DIAGNOSIS — I671 Cerebral aneurysm, nonruptured: Secondary | ICD-10-CM

## 2019-07-10 ENCOUNTER — Encounter: Payer: Self-pay | Admitting: Cardiology

## 2019-07-10 ENCOUNTER — Ambulatory Visit (INDEPENDENT_AMBULATORY_CARE_PROVIDER_SITE_OTHER): Payer: Self-pay | Admitting: Cardiology

## 2019-07-10 ENCOUNTER — Other Ambulatory Visit: Payer: Self-pay

## 2019-07-10 ENCOUNTER — Ambulatory Visit (HOSPITAL_COMMUNITY)
Admission: RE | Admit: 2019-07-10 | Discharge: 2019-07-10 | Disposition: A | Discharge status: Home / Self Care | Payer: Self-pay | Source: Ambulatory Visit | Attending: Neuroradiology | Admitting: Neuroradiology

## 2019-07-10 VITALS — BP 138/88 | HR 83 | Ht 63.0 in | Wt 157.0 lb

## 2019-07-10 DIAGNOSIS — I4819 Other persistent atrial fibrillation: Secondary | ICD-10-CM | POA: Insufficient documentation

## 2019-07-10 DIAGNOSIS — I671 Cerebral aneurysm, nonruptured: Secondary | ICD-10-CM

## 2019-07-10 DIAGNOSIS — I10 Essential (primary) hypertension: Secondary | ICD-10-CM

## 2019-07-10 DIAGNOSIS — Z8673 Personal history of transient ischemic attack (TIA), and cerebral infarction without residual deficits: Secondary | ICD-10-CM

## 2019-07-10 DIAGNOSIS — E782 Mixed hyperlipidemia: Secondary | ICD-10-CM

## 2019-07-10 HISTORY — DX: Other persistent atrial fibrillation: I48.19

## 2019-07-10 HISTORY — DX: Mixed hyperlipidemia: E78.2

## 2019-07-10 HISTORY — DX: Personal history of transient ischemic attack (TIA), and cerebral infarction without residual deficits: Z86.73

## 2019-07-10 HISTORY — DX: Cerebral aneurysm, nonruptured: I67.1

## 2019-07-10 NOTE — Patient Instructions (Signed)
Medication Instructions:  Your physician recommends that you continue on your current medications as directed. Please refer to the Current Medication list given to you today.  *If you need a refill on your cardiac medications before your next appointment, please call your pharmacy*   Lab Work: None ordered   If you have labs (blood work) drawn today and your tests are completely normal, you will receive your results only by: . MyChart Message (if you have MyChart) OR . A paper copy in the mail If you have any lab test that is abnormal or we need to change your treatment, we will call you to review the results.   Testing/Procedures: None ordered    Follow-Up: At [facility], you and your health needs are our priority.  As part of our continuing mission to provide you with exceptional heart care, we have created designated Provider Care Teams.  These Care Teams include your primary Cardiologist (physician) and Advanced Practice Providers (APPs -  Physician Assistants and Nurse Practitioners) who all work together to provide you with the care you need, when you need it.  We recommend signing up for the patient portal called "MyChart".  Sign up information is provided on this After Visit Summary.  MyChart is used to connect with patients for Virtual Visits (Telemedicine).  Patients are able to view lab/test results, encounter notes, upcoming appointments, etc.  Non-urgent messages can be sent to your provider as well.   To learn more about what you can do with MyChart, go to [email or web address].    Your next appointment:   3 month(s)  The format for your next appointment:   In Person  Provider:   Kardie Tobb, DO   Other Instructions None   

## 2019-07-10 NOTE — Progress Notes (Signed)
Referring Physician(s): de Macedo Rodrigues,Marius Betts  Chief Complaint: The patient is seen in follow up today s/p mechanical thrombectomy of left M1/MCA occlusion eith incidental AComA aneurysm.  History of present illness:64 year old male with mid to hospital on 06/20/2019 with a left M1/MCA occlusion.  He underwent successful mechanical thrombectomy with complete recanalization of the left MCA.  He was found to have an incidental 5 mm anterior communicating artery aneurysm.  Plans for coiling of this aneurysm during admission canceled due to UTI with renal failure.  He was discharged home with a Foley catheter.  Currently on Xarelto for stroke prevention.    Past Medical History:  Diagnosis Date  . Atrial fibrillation (HCC)   . HTN (hypertension)     Past Surgical History:  Procedure Laterality Date  . APPENDECTOMY    . IR CT HEAD LTD  06/20/2019  . IR PERCUTANEOUS ART THROMBECTOMY/INFUSION INTRACRANIAL INC DIAG ANGIO  06/20/2019  . IR US GUIDE VASC ACCESS RIGHT  06/20/2019  . KNEE SURGERY Right   . RADIOLOGY WITH ANESTHESIA N/A 06/20/2019   Procedure: IR WITH ANESTHESIA;  Surgeon: Julieanne Cotton, MD;  Location: [facility];  Service: Radiology;  Laterality: N/A;    Allergies: Contrast media [iodinated diagnostic agents]  Medications: Prior to Admission medications   Medication Sig Start Date End Date Taking? Authorizing Provider  amLODipine (NORVASC) 5 MG tablet Take 0.5 tablets (2.5 mg total) by mouth daily. 06/26/19 09/24/19  Layne Benton, NP  atorvastatin (LIPITOR) 40 MG tablet Take 1 tablet (40 mg total) by mouth daily at 6 PM. 06/26/19   Layne Benton, NP  benzonatate (TESSALON) 100 MG capsule Take 1 capsule (100 mg total) by mouth 3 (three) times daily as needed. 06/26/19   Layne Benton, NP  cephALEXin (KEFLEX) 500 MG capsule Take 1 capsule (500 mg total) by mouth 3 (three) times daily. 06/26/19   Layne Benton, NP  ferrous sulfate 325 (65 FE) MG tablet Take 325 mg by  mouth 2 (two) times daily. PCP advised him to take this for 3 months. (Started June 2021)    [provider]  rivaroxaban (XARELTO) 20 MG TABS tablet Take 1 tablet (20 mg total) by mouth daily with supper. Take with the biggest meal of the day at the same time every day. Do not miss a dose. Set your alarm to remind you to take your Xarelto. 06/26/19   Layne Benton, NP  tamsulosin (FLOMAX) 0.4 MG CAPS capsule Take 1 capsule (0.4 mg total) by mouth daily. 06/27/19   Layne Benton, NP     Family History  Problem Relation Age of Onset  . Prostate cancer Father     Social History   Socioeconomic History  . Marital status: Unknown    Spouse name: Not on file  . Number of children: Not on file  . Years of education: Not on file  . Highest education level: Not on file  Occupational History  . Not on file  Tobacco Use  . Smoking status: Never Smoker  . Smokeless tobacco: Never Used  Vaping Use  . Vaping Use: Never used  Substance and Sexual Activity  . Alcohol use: Never  . Drug use: Never  . Sexual activity: Not on file  Other Topics Concern  . Not on file  Social History Narrative  . Not on file   Social Determinants of Health   Financial Resource Strain:   . Difficulty of Paying Living Expenses:   Food Insecurity:   .  Worried About Programme researcher, broadcasting/film/video in the Last Year:   . Barista in the Last Year:   Transportation Needs:   . Freight forwarder (Medical):   Marland Kitchen Lack of Transportation (Non-Medical):   Physical Activity:   . Days of Exercise per Week:   . Minutes of Exercise per Session:   Stress:   . Feeling of Stress :   Social Connections:   . Frequency of Communication with Friends and Family:   . Frequency of Social Gatherings with Friends and Family:   . Attends Religious Services:   . Active Member of Clubs or Organizations:   . Attends Banker Meetings:   Marland Kitchen Marital Status:      Vital Signs: There were no vitals taken for  this visit.  Physical Exam Constitutional:      Appearance: Normal appearance.  HENT:     Head: Normocephalic.  Eyes:     Extraocular Movements: Extraocular movements intact.     Pupils: Pupils are equal, round, and reactive to light.  Neurological:     Mental Status: He is alert.     Sensory: Sensory deficit present.     Comments: Mild RLE parecthesia.  Psychiatric:        Mood and Affect: Mood normal.     Imaging: No results found.  Labs:  CBC: Recent Labs    06/23/19 0647 06/24/19 0353 06/25/19 0220 06/26/19 0349  WBC 28.3* 33.9* 16.9* 9.8  HGB 14.6 13.4 12.3* 11.3*  HCT 45.1 41.9 37.7* 35.1*  PLT 195 170 151 129*    COAGS: Recent Labs    06/20/19 1935 06/20/19 1935 06/21/19 2101 06/22/19 0509 06/22/19 0509 06/23/19 0647 06/24/19 0353 06/25/19 0220 06/26/19 0349  INR 1.0   < >  --  1.3*   < > 1.3* 1.3* 1.4* 1.3*  APTT 27  --  69* 75*  --   --   --   --   --    < > = values in this interval not displayed.    BMP: Recent Labs    06/23/19 0647 06/24/19 0353 06/25/19 0220 06/26/19 0747  NA 136 135 134* 135  K 3.4* 3.6 3.7 3.9  CL 104 106 104 107  CO2 20* 19* 21* 18*  GLUCOSE 100* 96 96 88  BUN 15 33* 47* 38*  CALCIUM 8.5* 7.9* 7.8* 7.8*  CREATININE 1.19 3.02* 3.40* 1.86*  GFRNONAA >60 21* 18* 37*  GFRAA >60 24* 21* 43*    LIVER FUNCTION TESTS: Recent Labs    06/20/19 1935 06/24/19 0353  BILITOT 1.0 1.0  AST 29 30  ALT 67* 31  ALKPHOS 57 64  PROT 6.6 5.5*  ALBUMIN 3.5 2.4*    Assessment: 64 year old male with unruptured 5 mm anterior communicating artery aneurysm.  He comes for consultation with his youngest daughter.  The natural history of brain aneurysm and different management options were explained to the patient and his daughter, including monitoring, endovascular treatment and open surgical clipping.  Patient would like to pursue endovascular treatment.  Risks related to the procedure were discussed with the patient.  He is  scheduled for an urology consult on 07/16/2019.  We will contact him on Friday 07/17/2019 to schedule his elective intervention.  He will need to transition to a dual antiplatelet regimen a week before intervention with transition back to anticoagulation after intervention.  This will be planned after intervention date is set.  All his and his daughter's questions  were answered to their satisfaction.  .   Signed: Pedro Earls, MD 07/10/2019, 3:47 PM    I spent a total of  25 Minutes in face to face in clinical consultation, greater than 50% of which was counseling/coordinating care for anterior communicating artery aneurysm.

## 2019-07-10 NOTE — Progress Notes (Signed)
Cardiology Office Note:    Date:  07/10/2019   ID:  Blake Carey, DOB Jan 07, 1956, MRN [id number]  PCP:  Dema Severin, NP  Cardiologist:  Thomasene Ripple, DO  Electrophysiologist:  None   Referring MD: Dema Severin, NP   Chief Complaint  Patient presents with  . Follow-up    1 MO FU    History of Present Illness:    Blake Carey is a 64 y.o. male with a hx of atrial fibrillation previously on eliquis and had a recent left MCA embolic stroke status post TICI3 reperfusion therefore he was switched to Xarelto, Hypertension, Hyperlipidemia.  At his last visit we discussed that it will be of great benefit to get a cardioversion, however the patient prefers to discuss this at his follow-up visit.  He was continued on his Eliquis 5 mg twice a day and his rate control agent with carvedilol.  In terms of his hyperlipidemia we discussed the need for diet modification.  Elevated blood pressure I did add amlodipine 5 daily as well as his carvedilol 12.5 mg twice a day.  In the interim he was admitted to [facility] for  left MCA embolic stroke. He is  status post TICI3 reperfusion (IR mechanical thrombectomy).  He was followed by neurology.  His MRI in addition showed a 5 mm Saccular Aneurysm of the ACOM with plans for coiling during his hospitalization however this was canceled due to his UTI renal failure.   During his hospitalization he was transitioned from Eliquis to Xarelto.  With plans to transition the patient to warfarin in 1 month which may align with his plan for aneurysmal repair.   Today he is here with his daughter and he is in good spirits and is happy with his recovery.  No complaints at this time.  This visit was facilitated by the live interpreter (Alis).   Past Medical History:  Diagnosis Date  . Atrial fibrillation (HCC)   . HTN (hypertension)     Past Surgical History:  Procedure Laterality Date  . APPENDECTOMY    . IR CT HEAD LTD  06/20/2019  . IR  PERCUTANEOUS ART THROMBECTOMY/INFUSION INTRACRANIAL INC DIAG ANGIO  06/20/2019  . IR US GUIDE VASC ACCESS RIGHT  06/20/2019  . KNEE SURGERY Right   . RADIOLOGY WITH ANESTHESIA N/A 06/20/2019   Procedure: IR WITH ANESTHESIA;  Surgeon: Julieanne Cotton, MD;  Location: [facility] OR;  Service: Radiology;  Laterality: N/A;    Current Medications: Current Meds  Medication Sig  . amLODipine (NORVASC) 5 MG tablet Take 0.5 tablets (2.5 mg total) by mouth daily.  Marland Kitchen atorvastatin (LIPITOR) 40 MG tablet Take 1 tablet (40 mg total) by mouth daily at 6 PM.  . benzonatate (TESSALON) 100 MG capsule Take 1 capsule (100 mg total) by mouth 3 (three) times daily as needed.  . cephALEXin (KEFLEX) 500 MG capsule Take 1 capsule (500 mg total) by mouth 3 (three) times daily.  . ferrous sulfate 325 (65 FE) MG tablet Take 325 mg by mouth 2 (two) times daily. PCP advised him to take this for 3 months. (Started June 2021)  . rivaroxaban (XARELTO) 20 MG TABS tablet Take 1 tablet (20 mg total) by mouth daily with supper. Take with the biggest meal of the day at the same time every day. Do not miss a dose. Set your alarm to remind you to take your Xarelto.  . tamsulosin (FLOMAX) 0.4 MG CAPS capsule Take 1 capsule (0.4 mg total) by mouth  daily.     Allergies:   Contrast media [iodinated diagnostic agents]   Social History   Socioeconomic History  . Marital status: Unknown    Spouse name: Not on file  . Number of children: Not on file  . Years of education: Not on file  . Highest education level: Not on file  Occupational History  . Not on file  Tobacco Use  . Smoking status: Never Smoker  . Smokeless tobacco: Never Used  Vaping Use  . Vaping Use: Never used  Substance and Sexual Activity  . Alcohol use: Never  . Drug use: Never  . Sexual activity: Not on file  Other Topics Concern  . Not on file  Social History Narrative  . Not on file   Social Determinants of Health   Financial Resource Strain:   .  Difficulty of Paying Living Expenses:   Food Insecurity:   . Worried About Charity fundraiser in the Last Year:   . Arboriculturist in the Last Year:   Transportation Needs:   . Film/video editor (Medical):   Marland Kitchen Lack of Transportation (Non-Medical):   Physical Activity:   . Days of Exercise per Week:   . Minutes of Exercise per Session:   Stress:   . Feeling of Stress :   Social Connections:   . Frequency of Communication with Friends and Family:   . Frequency of Social Gatherings with Friends and Family:   . Attends Religious Services:   . Active Member of Clubs or Organizations:   . Attends Archivist Meetings:   Marland Kitchen Marital Status:      Family History: The patient's family history includes Prostate cancer in his father.  ROS:   Review of Systems  Constitution: Negative for decreased appetite, fever and weight gain.  HENT: Negative for congestion, ear discharge, hoarse voice and sore throat.   Eyes: Negative for discharge, redness, vision loss in right eye and visual halos.  Cardiovascular: Negative for chest pain, dyspnea on exertion, leg swelling, orthopnea and palpitations.  Respiratory: Negative for cough, hemoptysis, shortness of breath and snoring.   Endocrine: Negative for heat intolerance and polyphagia.  Hematologic/Lymphatic: Negative for bleeding problem. Does not bruise/bleed easily.  Skin: Negative for flushing, nail changes, rash and suspicious lesions.  Musculoskeletal: Negative for arthritis, joint pain, muscle cramps, myalgias, neck pain and stiffness.  Gastrointestinal: Negative for abdominal pain, bowel incontinence, diarrhea and excessive appetite.  Genitourinary: Negative for decreased libido, genital sores and incomplete emptying.  Neurological: Negative for brief paralysis, focal weakness, headaches and loss of balance.  Psychiatric/Behavioral: Negative for altered mental status, depression and suicidal ideas.  Allergic/Immunologic:  Negative for HIV exposure and persistent infections.    EKGs/Labs/Other Studies Reviewed:    The following studies were reviewed today:   EKG:  None today  MR Brain IMPRESSION: 06/21/2019 1. Few, tiny acute infarcts in the left MCA distribution, primarily at the basal ganglia. There is a punctate acute cortical infarct in the high right frontal lobe. 2. The treated left M1 segment remains patent with irregularity from atherosclerosis or treated thrombus. The upper division left M2 branch is severely stenotic with focal appearance likely reflecting atherosclerosis. 3. Extensive intracranial atherosclerosis with dominant narrowings noted above. 4. 5 mm anterior communicating artery aneurysm.   TTE IMPRESSIONS 04/01/2019 1. Left ventricular ejection fraction, by estimation, is 60 to 65%. The left ventricle has normal function. The left ventricle has no regional wall motion abnormalities. Left ventricular diastolic  parameters are  indeterminate.  2. Right ventricular systolic function is normal. The right ventricular size is normal. There is normal pulmonary artery systolic pressure.  3. Left atrial size was moderately dilated.  4. The mitral valve is normal in structure and function. Trivial mitral valve regurgitation. No evidence of mitral stenosis.  5. The aortic valve is normal in structure and function. Aortic valve regurgitation is mild. No aortic stenosis is present.  6. The inferior vena cava is normal in size with greater than 50% respiratory variability, suggesting right atrial pressure of 3 mmHg.   Recent Labs: 06/24/2019: ALT 31 06/26/2019: BUN 38; Creatinine, Ser 1.86; Hemoglobin 11.3; Platelets 129; Potassium 3.9; Sodium 135  Recent Lipid Panel    Component Value Date/Time   CHOL 212 (H) 06/21/2019 0236   TRIG 94 06/21/2019 0236   HDL 50 06/21/2019 0236   CHOLHDL 4.2 06/21/2019 0236   VLDL 19 06/21/2019 0236   LDLCALC 143 (H) 06/21/2019 0236    Physical Exam:     VS:  BP 138/88 (BP Location: Left Arm, Patient Position: Sitting, Cuff Size: Normal)   Pulse 83   Ht 5\' 3"  (1.6 m)   Wt 157 lb (71.2 kg)   SpO2 97%   BMI 27.81 kg/m     Wt Readings from Last 3 Encounters:  07/10/19 157 lb (71.2 kg)  06/26/19 168 lb (76.2 kg)  06/12/19 168 lb (76.2 kg)     GEN: Well nourished, well developed in no acute distress HEENT: Normal NECK: No JVD; No carotid bruits LYMPHATICS: No lymphadenopathy CARDIAC: S1S2 noted,RRR, no murmurs, rubs, gallops RESPIRATORY:  Clear to auscultation without rales, wheezing or rhonchi  ABDOMEN: Soft, non-tender, non-distended, +bowel sounds, no guarding. EXTREMITIES: No edema, No cyanosis, no clubbing MUSCULOSKELETAL:  No deformity  SKIN: Warm and dry NEUROLOGIC:  Alert and oriented x 3, non-focal PSYCHIATRIC:  Normal affect, good insight  ASSESSMENT:    1. History of CVA (cerebrovascular accident)   2. Persistent atrial fibrillation (HCC)   3. Essential hypertension   4. Brain aneurysm   5. Mixed hyperlipidemia    PLAN:     He appears to be recovering from his CVA, back to his baseline. He is still on Xarelto, I will defer to neurology whether transitioning to his Coumadin especially in the setting that they need to plan his aneurysmal repair.  His blood pressure acceptable in the office today.  I will continue the current regimen.  Hx of his atrial fibrillation-he is in atrial fibrillation today.  He still needs an attempt for cardioversion and hopefully getting him back into sinus rhythm.  But I am going to hold off as if we do cardiovert the patietnt he would have to stay on his anticoagulation for 4 straight weeks no interruption.  So we will hold off until after his aneurysmal repair.  Hyperlipidemia-he is now on Lipitor 40 mg daily.  The patient is in agreement with the above plan. The patient and his daughter left the office in stable condition.  The patient will follow up in 3 months or sooner if  needed.   Medication Adjustments/Labs and Tests Ordered: Current medicines are reviewed at length with the patient today.  Concerns regarding medicines are outlined above.  No orders of the defined types were placed in this encounter.  No orders of the defined types were placed in this encounter.   Patient Instructions  Medication Instructions:  Your physician recommends that you continue on your current medications as directed. Please  refer to the Current Medication list given to you today.  *If you need a refill on your cardiac medications before your next appointment, please call your pharmacy*   Lab Work: None ordered   If you have labs (blood work) drawn today and your tests are completely normal, you will receive your results only by: Marland Kitchen MyChart Message (if you have MyChart) OR . A paper copy in the mail If you have any lab test that is abnormal or we need to change your treatment, we will call you to review the results.   Testing/Procedures: None ordered    Follow-Up: At [facility], you and your health needs are our priority.  As part of our continuing mission to provide you with exceptional heart care, we have created designated Provider Care Teams.  These Care Teams include your primary Cardiologist (physician) and Advanced Practice Providers (APPs -  Physician Assistants and Nurse Practitioners) who all work together to provide you with the care you need, when you need it.  We recommend signing up for the patient portal called "MyChart".  Sign up information is provided on this After Visit Summary.  MyChart is used to connect with patients for Virtual Visits (Telemedicine).  Patients are able to view lab/test results, encounter notes, upcoming appointments, etc.  Non-urgent messages can be sent to your provider as well.   To learn more about what you can do with MyChart, go to [email or web address].    Your next appointment:   3 month(s)  The format for your next  appointment:   In Person  Provider:   Thomasene Ripple, DO   Other Instructions None      Adopting a Healthy Lifestyle.  Know what a healthy weight is for you (roughly BMI <25) and aim to maintain this   Aim for 7+ servings of fruits and vegetables daily   65-80+ fluid ounces of water or unsweet tea for healthy kidneys   Limit to max 1 drink of alcohol per day; avoid smoking/tobacco   Limit animal fats in diet for cholesterol and heart health - choose grass fed whenever available   Avoid highly processed foods, and foods high in saturated/trans fats   Aim for low stress - take time to unwind and care for your mental health   Aim for 150 min of moderate intensity exercise weekly for heart health, and weights twice weekly for bone health   Aim for 7-9 hours of sleep daily   When it comes to diets, agreement about the perfect plan isnt easy to find, even among the experts. Experts at the [facility] of [employer] developed an idea known as the Healthy Eating Plate. Just imagine a plate divided into logical, healthy portions.   The emphasis is on diet quality:   Load up on vegetables and fruits - one-half of your plate: Aim for color and variety, and remember that potatoes dont count.   Go for whole grains - one-quarter of your plate: Whole wheat, barley, wheat berries, quinoa, oats, brown rice, and foods made with them. If you want pasta, go with whole wheat pasta.   Protein power - one-quarter of your plate: Fish, chicken, beans, and nuts are all healthy, versatile protein sources. Limit red meat.   The diet, however, does go beyond the plate, offering a few other suggestions.   Use healthy plant oils, such as olive, canola, soy, corn, sunflower and peanut. Check the labels, and avoid partially hydrogenated oil, which have unhealthy trans fats.  If youre thirsty, drink water. Coffee and tea are good in moderation, but skip sugary drinks and limit milk and dairy  products to one or two daily servings.   The type of carbohydrate in the diet is more important than the amount. Some sources of carbohydrates, such as vegetables, fruits, whole grains, and beans-are healthier than others.   Finally, stay active  Signed, Thomasene Ripple, DO  07/10/2019 12:34 PM    [facility]

## 2019-07-13 ENCOUNTER — Ambulatory Visit: Payer: Self-pay | Admitting: Cardiology

## 2019-07-21 ENCOUNTER — Other Ambulatory Visit (HOSPITAL_COMMUNITY): Payer: Self-pay | Admitting: Neuroradiology

## 2019-07-21 DIAGNOSIS — I671 Cerebral aneurysm, nonruptured: Secondary | ICD-10-CM

## 2019-07-27 ENCOUNTER — Telehealth: Payer: Self-pay | Admitting: Cardiology

## 2019-07-27 NOTE — Telephone Encounter (Signed)
Spoke to patients daughter and let her know that I just got samples out for the patient to come by and pick up at his earliest convenience. She verbalizes understanding and thanks me for the call back.    Encouraged patient to call back with any questions or concerns.

## 2019-07-27 NOTE — Telephone Encounter (Signed)
Patient calling the office for samples of medication:   1.  What medication and dosage are you requesting samples for? rivaroxaban (XARELTO) 20 MG TABS tablet  2.  Are you currently out of this medication? Yes   

## 2019-07-28 ENCOUNTER — Telehealth: Payer: Self-pay | Admitting: Student

## 2019-07-28 NOTE — Telephone Encounter (Signed)
NIR.  Received message from patient's daughter, Stephan Minister, requesting call back regarding pre-medications. Called Felisha [phone]) at [phone] to discuss. Felisha had questions regarding Brilinta/Aspirin use. Patient to take Brilinta 90 mg twice daily and Aspirin 81 mg once daily. Informed Felisha of pre-medication regimen, also faxed to [facility] [phone] at [phone]: 1- Prednisone 50 mg tablets; take one tablet by mouth 13 hours, 7 hours, and 1 hour prior to procedure on 08/05/2019; dispense 3 tablets with 0 refills. 2- Benadryl 50 mg tablets; take one tablet by mouth 1 hour prior to procedure on 08/05/2019; dispense 1 tablet with 0 refills.  All questions answered and concerns addressed. Please call NIR with questions/concerns.   Waylan Boga Saidee Geremia, PA-C 07/28/2019, 11:15 AM

## 2019-08-01 ENCOUNTER — Other Ambulatory Visit (HOSPITAL_COMMUNITY)
Admission: RE | Admit: 2019-08-01 | Discharge: 2019-08-01 | Disposition: A | Discharge status: Home / Self Care | Payer: Self-pay | Source: Ambulatory Visit | Attending: Neuroradiology | Admitting: Neuroradiology

## 2019-08-01 DIAGNOSIS — Z20822 Contact with and (suspected) exposure to covid-19: Secondary | ICD-10-CM | POA: Insufficient documentation

## 2019-08-01 DIAGNOSIS — Z01812 Encounter for preprocedural laboratory examination: Secondary | ICD-10-CM | POA: Insufficient documentation

## 2019-08-01 LAB — SARS CORONAVIRUS 2 (TAT 6-24 HRS): SARS Coronavirus 2: NEGATIVE

## 2019-08-04 ENCOUNTER — Encounter (HOSPITAL_COMMUNITY): Payer: Self-pay | Admitting: *Deleted

## 2019-08-04 ENCOUNTER — Other Ambulatory Visit: Payer: Self-pay

## 2019-08-04 ENCOUNTER — Other Ambulatory Visit: Payer: Self-pay | Admitting: Student

## 2019-08-04 NOTE — Progress Notes (Addendum)
Spoke with pt's daughter, Delray Alt for pre-op call. DPR on file and pt speaks Spanish. Pt has hx of A-fib and Dr. Servando Salina is his cardiologist, LOV 07/10/19. Pt on Xarelto, last dose was 07/28/19 evening dose. Pt then was started on Brilinta 90 mg BID and Aspirin 81 mg 1 daily for upcoming procedure. Started these 2 meds 07/29/19.  Pt is not diabetic.   Pt is allergic to IVP contrast. Pre-medication meds are to be taken. Per pt's daughter they will pick them up today and I reviewed with [name] the times the medications are to be taken.   Covid test done 06/01/19 and it's negative. [name] states pt has been in quarantine since the test was done and will continue until he comes to the hospital.

## 2019-08-04 NOTE — Anesthesia Preprocedure Evaluation (Addendum)
Anesthesia Evaluation  Patient identified by MRN, date of birth, ID band Patient awake    Reviewed: Allergy & Precautions, NPO status , Patient's Chart, lab work & pertinent test results  Airway Mallampati: II  TM Distance: >3 FB Neck ROM: Full    Dental   Pulmonary    Pulmonary exam normal        Cardiovascular hypertension, Pt. on medications Normal cardiovascular exam+ dysrhythmias Atrial Fibrillation      Neuro/Psych CVA    GI/Hepatic   Endo/Other    Renal/GU      Musculoskeletal   Abdominal   Peds  Hematology   Anesthesia Other Findings   Reproductive/Obstetrics                            Anesthesia Physical Anesthesia Plan  ASA: III  Anesthesia Plan: General   Post-op Pain Management:    Induction: Intravenous  PONV Risk Score and Plan: 2 and Ondansetron and Midazolam  Airway Management Planned: Oral ETT  Additional Equipment: Arterial line  Intra-op Plan:   Post-operative Plan: Extubation in OR  Informed Consent: I have reviewed the patients History and Physical, chart, labs and discussed the procedure including the risks, benefits and alternatives for the proposed anesthesia with the patient or authorized representative who has indicated his/her understanding and acceptance.       Plan Discussed with: CRNA and Surgeon  Anesthesia Plan Comments: (PAT note written 08/04/2019 by Shonna Chock, PA-C. )       Anesthesia Quick Evaluation

## 2019-08-04 NOTE — Progress Notes (Signed)
Anesthesia Chart Review: Blake Carey   Case: [id number] Date/Time: 08/05/19 0815   Procedure: ANEURYSM EMBOLIZATION (N/A )   Anesthesia type: General   Pre-op diagnosis: BRAIN ANEURYSM   [facility] RAD[facility] / [facility]   Surgeons: de Glori Luis, MD      DISCUSSION: Patient is a 64 year old male scheduled for the above procedure. He presented on 06/20/19 with acute onset of right hemiplegia and aphasia and was diagnosed with acute CVA, left M1 occlusion and underwent left sided cervical and cerebral angiogram and treatment of left M1 occlusion with single pass of aspiration thrombectomy restoring complete flow through the M1. 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 He was scheduled to also undergo an image-guided cerebral arteriogram with possible embolization of ACOM aneurysm on 06/23/19, but procedure was cancelled due to leukocytosis (WBC 28.3K, up from 16.5K) and diagnosed with complicated UTI and AKI (contrast induced nephropathy versus obstructive urinary retention) and treated with Rocephin and then oral Keflex. Out-patient possible embolization of ACOM aneurysm planned.  Other history includes never smoker, HTN, afib (diagnosed 12/02/18), CVA 06/20/19 (s/p mechanical thrombectomy), ACOM cerebral aneurysm, anemia (iron-deficiency), AKI (contrast nephropathy versus obstructive uropathy 05/2019, Cr peak 3.40). 06/21/19 A1c was 6.0% consistent with pre-diabetes. Primary language is Spanish.  There was discussion of switching him from Eliquis to warfarin during May 2021 CVA admission due to being more affordable; however, timing of discharge did not allow for INR checks, so given free samples of Xarelto with plans to transition to warfarin with Lovenox bridge following aneurysmal repair. Coreg discontinued during hospitalization due to bradycardia.  Last evaluated by cardiologist Dr. Servando Salina on 07/10/19, who is aware of planned  procedure.   By notes, for aneurysm intervention, IR recommended transitioning him to DAPT for one week prior to procedure with transition back to anticoagulation after the intervention. [name], patient's daughter, reported that last dose Xarelto 07/28/18 PM and started on Brilinta on 07/29/18. He is on Brilinta 90 mg BID and ASA 81 mg daily. IR has ordered pre-medication including: "1- Prednisone 50 mg tablets; take one tablet by mouth 13 hours, 7 hours, and 1 hour prior to procedure on 08/05/2019; dispense 3 tablets with 0 refills. 2- Benadryl 50 mg tablets; take one tablet by mouth 1 hour prior to procedure on 08/05/2019; dispense 1 tablet with 0 refills."  Appears he received Moderna COVID-19 vaccines through [facility] ~ 04/28/19. Pre-procedure COVID-19 test on 08/01/19 was negative.  He is a same day work-up, so labs and further evaluation by anesthesia team on the day of surgery.    VS: As of 07/10/19, WT 71.2 kg, BP 138/88, HR 83   PROVIDERS: York, Regina F, NP is PCP  - Thomasene Ripple, DO is cardiologist. Established 12/03/18 after diagnosis of new afib and started on Coreg and Eliquis. As of 05/15/19, he had decided not to pursue cardioversion. Last visit 07/10/19 following CVA admission. She noted change in anticoagulation medication. (Coreg discontinued during CVA admission due to bradycardia.) By notes, he was back at his baseline from a neurologist standpoint. She again mentioned cardioversion following recovery from aneurysm repair if he will consent. - Seen by neurologists Marvel Plan, MD and Delia Heady, MD for CVA during 05/2019 hospitalization - Notes indicate that he had a urology consult on 07/16/19.   LABS: Currently, most recent lab results available in [facility] include: Lab Results  Component Value Date   WBC 9.8 06/26/2019   HGB  11.3 (L) 06/26/2019   HCT 35.1 (L) 06/26/2019   PLT 129 (L) 06/26/2019   GLUCOSE 88 06/26/2019   CHOL 212 (H) 06/21/2019   TRIG 94 06/21/2019   HDL 50  06/21/2019   LDLCALC 143 (H) 06/21/2019   ALT 31 06/24/2019   AST 30 06/24/2019   NA 135 06/26/2019   K 3.9 06/26/2019   CL 107 06/26/2019   CREATININE 1.86 (H) 06/26/2019   BUN 38 (H) 06/26/2019   CO2 18 (L) 06/26/2019   INR 1.3 (H) 06/26/2019   HGBA1C 6.0 (H) 06/21/2019     IMAGES: US Renal 06/25/19 (done for AKI): IMPRESSION: 1. Slightly echogenic kidneys bilaterally. There is mild bilateral hydronephrosis. 2. Trace perinephric edema/fluid 3. Distended urinary bladder with debris  1V PCXR 06/23/19: FINDINGS: Lungs are clear. Heart is upper normal in size with pulmonary vascularity normal. No adenopathy. No bone lesions. IMPRESSION: Lungs clear.  Heart upper normal in size.  No adenopathy.  MRI/MRA head 06/21/19: IMPRESSION: 1. Few, tiny acute infarcts in the left MCA distribution, primarily at the basal ganglia. There is a punctate acute cortical infarct in the high right frontal lobe. 2. The treated left M1 segment remains patent with irregularity from atherosclerosis or treated thrombus. The upper division left M2 branch is severely stenotic with focal appearance likely reflecting atherosclerosis. 3. Extensive intracranial atherosclerosis with dominant narrowings noted above. 4. 5 mm anterior communicating artery aneurysm.  CTA head (Code Stroke) 06/20/19: IMPRESSION: 1. Positive for Left MCA M1 large vessel occlusion. 2. 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 3. Preliminary results of the above were communicated to Dr. Otelia Limes at 8:02 pm by text page via the [facility] messaging system. 4. Additionally, the study is positive for: - Severe stenosis of the Dominant Left Vertebral Artery V4 segment, - Moderate distal Basilar Artery stenosis, - severe Right PCA P2 stenosis, - mild to moderate Right MCA branch irregularity. Percutaneous Intervention 06/20/19: IMPRESSION: Status post ultrasound-guided access right common femoral  artery for left-sided cervical and cerebral angiogram and treatment of left M1 occlusion with single pass of aspiration thrombectomy restoring complete flow through the M1.  Head CT 06/20/19 (Code Stroke) 06/20/19: IMPRESSION: 1. Normal for age non contrast CT appearance of the brain. ASPECTS 10.   EKG: 06/08/19:  Ventricular rate 72 bpm Atrial fibrillation with premature ventricular or aberrantly conducted complexed Left axis deviation Septal infarct (age undetermined) ST & T wave abnormality    CV: Echo 04/01/19: IMPRESSIONS  1. Left ventricular ejection fraction, by estimation, is 60 to 65%. The  left ventricle has normal function. The left ventricle has no regional  wall motion abnormalities. Left ventricular diastolic parameters are  indeterminate.  2. Right ventricular systolic function is normal. The right ventricular  size is normal. There is normal pulmonary artery systolic pressure.  3. Left atrial size was moderately dilated.  4. The mitral valve is normal in structure and function. Trivial mitral  valve regurgitation. No evidence of mitral stenosis.  5. The aortic valve is normal in structure and function. Aortic valve  regurgitation is mild. No aortic stenosis is present.  6. The inferior vena cava is normal in size with greater than 50%  respiratory variability, suggesting right atrial pressure of 3 mmHg.    Past Medical History:  Diagnosis Date  . AKI (acute kidney injury) (HCC)    05/2019 (contrast nephropathy versus obstructive urinary retention with UTI)  . Anemia    low iron  . Atrial  fibrillation (HCC)   . Cerebral aneurysm    ACOM aneurysm 06/21/19  . HTN (hypertension)   . Stroke [facility])    06/20/19    Past Surgical History:  Procedure Laterality Date  . APPENDECTOMY    . IR CT HEAD LTD  06/20/2019  . IR PERCUTANEOUS ART THROMBECTOMY/INFUSION INTRACRANIAL INC DIAG ANGIO  06/20/2019  . IR US GUIDE VASC ACCESS RIGHT  06/20/2019  . KNEE SURGERY  Right   . RADIOLOGY WITH ANESTHESIA N/A 06/20/2019   Procedure: IR WITH ANESTHESIA;  Surgeon: Julieanne Cotton, MD;  Location: [facility] OR;  Service: Radiology;  Laterality: N/A;    MEDICATIONS: No current facility-administered medications for this encounter.   Marland Kitchen amLODipine (NORVASC) 5 MG tablet  . atorvastatin (LIPITOR) 40 MG tablet  . ferrous sulfate 325 (65 FE) MG tablet  . rivaroxaban (XARELTO) 20 MG TABS tablet  . benzonatate (TESSALON) 100 MG capsule  . cephALEXin (KEFLEX) 500 MG capsule  . tamsulosin (FLOMAX) 0.4 MG CAPS capsule   By current medication list, he is not currently taking Keflex, Flomax, Tessalon. As of 07/29/19, patient to also be on Brilinta 90 mg BID and ASA 81 mg daily.   Shonna Chock, PA-C Surgical Short Stay/Anesthesiology [facility] Phone [phone] [facility] Phone [phone] 08/04/2019 12:01 PM

## 2019-08-05 ENCOUNTER — Ambulatory Visit (HOSPITAL_COMMUNITY): Payer: Self-pay | Admitting: Vascular Surgery

## 2019-08-05 ENCOUNTER — Observation Stay (HOSPITAL_COMMUNITY)
Admission: RE | Admit: 2019-08-05 | Discharge: 2019-08-06 | Disposition: A | Discharge status: Home / Self Care | Payer: Self-pay | Attending: Neuroradiology | Admitting: Neuroradiology

## 2019-08-05 ENCOUNTER — Encounter (HOSPITAL_COMMUNITY): Payer: Self-pay

## 2019-08-05 ENCOUNTER — Encounter (HOSPITAL_COMMUNITY)
Admission: RE | Disposition: A | Discharge status: Home / Self Care | Payer: Self-pay | Source: Home / Self Care | Attending: Neuroradiology

## 2019-08-05 ENCOUNTER — Ambulatory Visit (HOSPITAL_COMMUNITY)
Admission: RE | Admit: 2019-08-05 | Discharge: 2019-08-05 | Disposition: A | Discharge status: Home / Self Care | Payer: Self-pay | Source: Ambulatory Visit | Attending: Neuroradiology | Admitting: Neuroradiology

## 2019-08-05 DIAGNOSIS — Z7982 Long term (current) use of aspirin: Secondary | ICD-10-CM | POA: Insufficient documentation

## 2019-08-05 DIAGNOSIS — D649 Anemia, unspecified: Secondary | ICD-10-CM | POA: Insufficient documentation

## 2019-08-05 DIAGNOSIS — I4891 Unspecified atrial fibrillation: Secondary | ICD-10-CM | POA: Insufficient documentation

## 2019-08-05 DIAGNOSIS — Z8673 Personal history of transient ischemic attack (TIA), and cerebral infarction without residual deficits: Secondary | ICD-10-CM | POA: Insufficient documentation

## 2019-08-05 DIAGNOSIS — Z79899 Other long term (current) drug therapy: Secondary | ICD-10-CM | POA: Insufficient documentation

## 2019-08-05 DIAGNOSIS — Z7901 Long term (current) use of anticoagulants: Secondary | ICD-10-CM | POA: Insufficient documentation

## 2019-08-05 DIAGNOSIS — I671 Cerebral aneurysm, nonruptured: Principal | ICD-10-CM | POA: Insufficient documentation

## 2019-08-05 DIAGNOSIS — I10 Essential (primary) hypertension: Secondary | ICD-10-CM | POA: Insufficient documentation

## 2019-08-05 HISTORY — DX: Acute kidney failure, unspecified: N17.9

## 2019-08-05 HISTORY — PX: IR TRANSCATH/EMBOLIZ: IMG695

## 2019-08-05 HISTORY — PX: RADIOLOGY WITH ANESTHESIA: SHX6223

## 2019-08-05 HISTORY — PX: IR US GUIDE VASC ACCESS RIGHT: IMG2390

## 2019-08-05 HISTORY — PX: IR ANGIOGRAM FOLLOW UP STUDY: IMG697

## 2019-08-05 HISTORY — PX: IR 3D INDEPENDENT WKST: IMG2385

## 2019-08-05 HISTORY — DX: Anemia, unspecified: D64.9

## 2019-08-05 HISTORY — DX: Cerebral aneurysm, nonruptured: I67.1

## 2019-08-05 HISTORY — DX: Cerebral infarction, unspecified: I63.9

## 2019-08-05 LAB — CBC WITH DIFFERENTIAL/PLATELET
Abs Immature Granulocytes: 0.02 10*3/uL (ref 0.00–0.07)
Basophils Absolute: 0 10*3/uL (ref 0.0–0.1)
Basophils Relative: 0 %
Eosinophils Absolute: 0 10*3/uL (ref 0.0–0.5)
Eosinophils Relative: 0 %
HCT: 41.7 % (ref 39.0–52.0)
Hemoglobin: 13 g/dL (ref 13.0–17.0)
Immature Granulocytes: 0 %
Lymphocytes Relative: 14 %
Lymphs Abs: 1.1 10*3/uL (ref 0.7–4.0)
MCH: 26.2 pg (ref 26.0–34.0)
MCHC: 31.2 g/dL (ref 30.0–36.0)
MCV: 83.9 fL (ref 80.0–100.0)
Monocytes Absolute: 0.1 10*3/uL (ref 0.1–1.0)
Monocytes Relative: 1 %
Neutro Abs: 7 10*3/uL (ref 1.7–7.7)
Neutrophils Relative %: 85 %
Platelets: 234 10*3/uL (ref 150–400)
RBC: 4.97 MIL/uL (ref 4.22–5.81)
RDW: 15 % (ref 11.5–15.5)
WBC: 8.3 10*3/uL (ref 4.0–10.5)
nRBC: 0 % (ref 0.0–0.2)

## 2019-08-05 LAB — BASIC METABOLIC PANEL
Anion gap: 12 (ref 5–15)
BUN: 14 mg/dL (ref 8–23)
CO2: 22 mmol/L (ref 22–32)
Calcium: 9.6 mg/dL (ref 8.9–10.3)
Chloride: 106 mmol/L (ref 98–111)
Creatinine, Ser: 1.01 mg/dL (ref 0.61–1.24)
GFR calc Af Amer: >60 mL/min (ref >60)
GFR calc non Af Amer: >60 mL/min (ref >60)
Glucose, Bld: 163 mg/dL — ABNORMAL HIGH (ref 70–99)
Potassium: 3.8 mmol/L (ref 3.5–5.1)
Sodium: 140 mmol/L (ref 135–145)

## 2019-08-05 LAB — PROTIME-INR
INR: 1.1 (ref 0.8–1.2)
Prothrombin Time: 13.9 seconds (ref 11.4–15.2)

## 2019-08-05 LAB — APTT: aPTT: 29 seconds (ref 24–36)

## 2019-08-05 SURGERY — MRI WITH ANESTHESIA
Anesthesia: General

## 2019-08-05 MED ORDER — ACETAMINOPHEN 325 MG PO TABS: 650.0000 mg | ORAL_TABLET | ORAL | Status: DC | PRN

## 2019-08-05 MED ORDER — GLYCOPYRROLATE 0.2 MG/ML IJ SOLN
INTRAMUSCULAR | Status: DC | PRN
  Administered 2019-08-05: .2 mg via INTRAVENOUS
  Administered 2019-08-05: .1 mg via INTRAVENOUS

## 2019-08-05 MED ORDER — VERAPAMIL HCL 2.5 MG/ML IV SOLN
INTRAVENOUS | Status: AC
  Filled 2019-08-05: qty 4

## 2019-08-05 MED ORDER — CHLORHEXIDINE GLUCONATE 0.12 % MT SOLN
15.0000 mL | Freq: Once | OROMUCOSAL | Status: AC
  Administered 2019-08-05: 15 mL via OROMUCOSAL
  Filled 2019-08-05: qty 15

## 2019-08-05 MED ORDER — VERAPAMIL HCL 2.5 MG/ML IV SOLN
INTRAVENOUS | Status: DC | PRN
  Administered 2019-08-05: 10 mg via INTRAVENOUS

## 2019-08-05 MED ORDER — ONDANSETRON HCL 4 MG/2ML IJ SOLN: 4.0000 mg | Freq: Once | INTRAMUSCULAR | Status: DC | PRN

## 2019-08-05 MED ORDER — PHENYLEPHRINE HCL-NACL 10-0.9 MG/250ML-% IV SOLN
INTRAVENOUS | Status: DC | PRN
Start: 2019-08-05 — End: 2019-08-05
  Administered 2019-08-05: 25 ug/min via INTRAVENOUS

## 2019-08-05 MED ORDER — CHLORHEXIDINE GLUCONATE CLOTH 2 % EX PADS: 6.0000 | MEDICATED_PAD | Freq: Every day | CUTANEOUS | Status: DC

## 2019-08-05 MED ORDER — NIMODIPINE 30 MG PO CAPS: 0.0000 mg | ORAL_CAPSULE | ORAL | Status: DC

## 2019-08-05 MED ORDER — TICAGRELOR 90 MG PO TABS
90.0000 mg | ORAL_TABLET | Freq: Once | ORAL | Status: AC
  Administered 2019-08-05: 90 mg via ORAL
  Filled 2019-08-05: qty 1

## 2019-08-05 MED ORDER — SUGAMMADEX SODIUM 200 MG/2ML IV SOLN
INTRAVENOUS | Status: DC | PRN
  Administered 2019-08-05: 200 mg via INTRAVENOUS

## 2019-08-05 MED ORDER — ROCURONIUM BROMIDE 10 MG/ML (PF) SYRINGE
PREFILLED_SYRINGE | INTRAVENOUS | Status: DC | PRN
  Administered 2019-08-05 (×2): 50 mg via INTRAVENOUS
  Administered 2019-08-05: 40 mg via INTRAVENOUS

## 2019-08-05 MED ORDER — IOHEXOL 240 MG/ML SOLN
150.0000 mL | Freq: Once | INTRAMUSCULAR | Status: AC | PRN
  Administered 2019-08-05: 20 mL via INTRA_ARTERIAL

## 2019-08-05 MED ORDER — DILTIAZEM HCL-DEXTROSE 125-5 MG/125ML-% IV SOLN (PREMIX)
5.0000 mg/h | INTRAVENOUS | Status: DC
  Filled 2019-08-05: qty 125

## 2019-08-05 MED ORDER — HEPARIN SODIUM (PORCINE) 1000 UNIT/ML IJ SOLN
INTRAMUSCULAR | Status: DC | PRN
Start: 2019-08-05 — End: 2019-08-05
  Administered 2019-08-05: 5000 [IU] via INTRAVENOUS

## 2019-08-05 MED ORDER — LIDOCAINE 2% (20 MG/ML) 5 ML SYRINGE
INTRAMUSCULAR | Status: DC | PRN
  Administered 2019-08-05 (×2): 100 mg via INTRAVENOUS

## 2019-08-05 MED ORDER — CEFAZOLIN SODIUM-DEXTROSE 2-4 GM/100ML-% IV SOLN: 2.0000 g | INTRAVENOUS | Status: DC

## 2019-08-05 MED ORDER — SODIUM CHLORIDE 0.9 % IV SOLN: INTRAVENOUS | Status: DC

## 2019-08-05 MED ORDER — ORAL CARE MOUTH RINSE: 15.0000 mL | Freq: Once | OROMUCOSAL | Status: AC

## 2019-08-05 MED ORDER — FENTANYL CITRATE (PF) 100 MCG/2ML IJ SOLN
INTRAMUSCULAR | Status: DC | PRN
  Administered 2019-08-05: 50 ug via INTRAVENOUS
  Administered 2019-08-05: 100 ug via INTRAVENOUS
  Administered 2019-08-05: 50 ug via INTRAVENOUS

## 2019-08-05 MED ORDER — ACETAMINOPHEN 160 MG/5ML PO SOLN: 650.0000 mg | ORAL | Status: DC | PRN

## 2019-08-05 MED ORDER — NIMODIPINE 30 MG PO CAPS
0.0000 mg | ORAL_CAPSULE | ORAL | Status: DC
  Filled 2019-08-05: qty 2

## 2019-08-05 MED ORDER — CEFAZOLIN SODIUM-DEXTROSE 2-4 GM/100ML-% IV SOLN
2.0000 g | INTRAVENOUS | Status: AC
  Administered 2019-08-05: 2 g via INTRAVENOUS
  Filled 2019-08-05: qty 100

## 2019-08-05 MED ORDER — MEPERIDINE HCL 25 MG/ML IJ SOLN: 6.2500 mg | INTRAMUSCULAR | Status: DC | PRN

## 2019-08-05 MED ORDER — AMLODIPINE BESYLATE 2.5 MG PO TABS
2.5000 mg | ORAL_TABLET | Freq: Every day | ORAL | Status: DC
  Administered 2019-08-06: 2.5 mg via ORAL
  Filled 2019-08-05: qty 1

## 2019-08-05 MED ORDER — ONDANSETRON HCL 4 MG/2ML IJ SOLN: 4.0000 mg | Freq: Four times a day (QID) | INTRAMUSCULAR | Status: DC | PRN

## 2019-08-05 MED ORDER — ASPIRIN EC 81 MG PO TBEC
81.0000 mg | DELAYED_RELEASE_TABLET | Freq: Every day | ORAL | Status: DC
  Administered 2019-08-06: 81 mg via ORAL
  Filled 2019-08-05: qty 1

## 2019-08-05 MED ORDER — HYDROMORPHONE HCL 1 MG/ML IJ SOLN: 0.2500 mg | INTRAMUSCULAR | Status: DC | PRN

## 2019-08-05 MED ORDER — ATORVASTATIN CALCIUM 40 MG PO TABS: 40.0000 mg | ORAL_TABLET | Freq: Every day | ORAL | Status: DC

## 2019-08-05 MED ORDER — ONDANSETRON HCL 4 MG/2ML IJ SOLN
INTRAMUSCULAR | Status: DC | PRN
  Administered 2019-08-05: 4 mg via INTRAVENOUS

## 2019-08-05 MED ORDER — TICAGRELOR 90 MG PO TABS
90.0000 mg | ORAL_TABLET | Freq: Once | ORAL | Status: DC
  Filled 2019-08-05: qty 1

## 2019-08-05 MED ORDER — LACTATED RINGERS IV SOLN: INTRAVENOUS | Status: DC

## 2019-08-05 MED ORDER — ACETAMINOPHEN 650 MG RE SUPP: 650.0000 mg | RECTAL | Status: DC | PRN

## 2019-08-05 MED ORDER — CLEVIDIPINE BUTYRATE 0.5 MG/ML IV EMUL
0.0000 mg/h | INTRAVENOUS | Status: DC
  Administered 2019-08-05: 1 mg/h via INTRAVENOUS
  Administered 2019-08-05: 4 mg/h via INTRAVENOUS
  Administered 2019-08-06: 2 mg/h via INTRAVENOUS
  Filled 2019-08-05: qty 100
  Filled 2019-08-05: qty 50

## 2019-08-05 MED ORDER — IOHEXOL 240 MG/ML SOLN
INTRAMUSCULAR | Status: AC
  Administered 2019-08-05: 75 mL
  Filled 2019-08-05: qty 200

## 2019-08-05 MED ORDER — PROPOFOL 10 MG/ML IV BOLUS
INTRAVENOUS | Status: DC | PRN
  Administered 2019-08-05 (×2): 100 mg via INTRAVENOUS

## 2019-08-05 MED ORDER — ASPIRIN EC 81 MG PO TBEC: 81.0000 mg | DELAYED_RELEASE_TABLET | Freq: Once | ORAL | Status: DC

## 2019-08-05 NOTE — H&P (Deleted)
  The note originally documented on this encounter has been moved the the encounter in which it belongs.  

## 2019-08-05 NOTE — H&P (Signed)
Chief Complaint: Patient was seen in consultation today for ACOM aneurysm  Referring Physician(s): [place]  Supervising Physician: Pedro Earls  Patient Status: [facility] - Out-pt  History of Present Illness: Blake Carey is a 64 y.o. male who presented to [facility] 06/20/19 with acute onset right hemiplegia and phasia, found to have acute L M1 occlusion treated with thrombectomy achieving TICI 3 after 1 pass aspiration by Dr. Earleen Newport.  He was also found to have a 12m saccular aneurysm of the ACOM.  He was evaluated for treatment of this aneurysm and this was planned for 5/25, however he was found to have an acute UTI with elevated WBC.  He was discharged home with Urology follow-up and plans to return for aneurysm treatment once his acute infection had resolved.   [name]preter. He has completed treatment for his UTI.  He is in his usual state of health.  He denies fever, chills, nausea, abdominal pain, vomiting, cough, shortness of breath.  He is on Xarelto for his a fib.  He has held this since 6/29.  He has started Brilinta 954mBID, and aspirin 8155maily.  He also has a contrast allergy and has taken his premedication appropriately.  He is agreeable to proceed with intervention this AM.   Past Medical History:  Diagnosis Date  . AKI (acute kidney injury) (HCCFiler  05/2019 (contrast nephropathy versus obstructive urinary retention with UTI)  . Anemia    low iron  . Atrial fibrillation (HCCPlanada . Cerebral aneurysm    ACOM aneurysm 06/21/19  . HTN (hypertension)   . [facility]  [facility]    Past Surgical History:  Procedure Laterality Date  . APPENDECTOMY    . IR CT HEAD LTD  06/20/2019  . IR PERCUTANEOUS ART THROMBECTOMY/INFUSION INTRACRANIAL INC DIAG ANGIO  06/20/2019  . IR US [place] VASC ACCESS RIGHT  06/20/2019  . KNEE SURGERY Right   . RADIOLOGY WITH ANESTHESIA N/A 06/20/2019    Procedure: IR WITH ANESTHESIA;  Surgeon: DevLuanne BrasD;  [facility];  Laterality: N/A;    Allergies: Contrast media [iodinated diagnostic agents]  Medications: Prior to Admission medications   Medication Sig Start Date End Date Taking? Authorizing Provider  amLODipine (NORVASC) 5 MG tablet Take 0.5 tablets (2.5 mg total) by mouth daily. 06/26/19 09/24/19  BibDonzetta StarchP  aspirin EC 81 MG tablet Take 81 mg by mouth daily. Swallow whole.    [provider]  atorvastatin (LIPITOR) 40 MG tablet Take 1 tablet (40 mg total) by mouth daily at 6 PM. 06/26/19   BibDonzetta StarchP  benzonatate (TESSALON) 100 MG capsule Take 1 capsule (100 mg total) by mouth 3 (three) times daily as needed. Patient not taking: Reported on 07/24/2019 06/26/19   BibDonzetta StarchP  cephALEXin (KEFLEX) 500 MG capsule Take 1 capsule (500 mg total) by mouth 3 (three) times daily. Patient not taking: Reported on 07/24/2019 06/26/19   BibDonzetta StarchP  diphenhydrAMINE (BENADRYL) 50 MG tablet Take 50 mg by mouth See admin instructions. Patient to take 1 tab 1 hour prior to procedure    [provider]  ferrous sulfate 325 (65 FE) MG tablet Take 325 mg by mouth 2 (two) times daily. PCP advised him to take this for 3 months. (Started June 2021)    [provider]  predniSONE (DELTASONE) 50  MG tablet Take 50 mg by mouth See admin instructions. Prior to procedure, patient to take 1 tab, 13 hours, 7 hours and 1 hour prior to procedure    [provider]  rivaroxaban (XARELTO) 20 MG TABS tablet Take 1 tablet (20 mg total) by mouth daily with supper. Take with the biggest meal of the day at the same time every day. Do not miss a dose. Set your alarm to remind you to take your Xarelto. 06/26/19   Donzetta Starch, NP  tamsulosin (FLOMAX) 0.4 MG CAPS capsule Take 1 capsule (0.4 mg total) by mouth daily. Patient not taking: Reported on 07/24/2019 06/27/19   Donzetta Starch, NP    ticagrelor (BRILINTA) 90 MG TABS tablet Take by mouth 2 (two) times daily.    [provider]     Family History  Problem Relation Age of Onset  . Prostate cancer Father     Social History   Socioeconomic History  . Marital status: Unknown    Spouse name: Not on file  . Number of children: Not on file  . Years of education: Not on file  . Highest education level: Not on file  Occupational History  . Not on file  Tobacco Use  . Smoking status: Never Smoker  . Smokeless tobacco: Never Used  Vaping Use  . Vaping Use: Never used  Substance and Sexual Activity  . 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 Drug use: Never  . Sexual activity: Not on file  Other Topics Concern  . Not on file  Social History Narrative  . Not on file   Social Determinants of Health   Financial Resource Strain:   . Difficulty of Paying Living Expenses:   Food Insecurity:   . Worried About Charity fundraiser in the Last Year:   . Arboriculturist in the Last Year:   Transportation Needs:   . Film/video editor (Medical):   Marland Kitchen Lack of Transportation (Non-Medical):   Physical Activity:   . Days of Exercise per Week:   . Minutes of Exercise per Session:   Stress:   . Feeling of Stress :   Social Connections:   . Frequency of Communication with Friends and Family:   . Frequency of Social Gatherings with Friends and Family:   . Attends Religious Services:   . Active Member of Clubs or Organizations:   . Attends Archivist Meetings:   Marland Kitchen Marital Status:      Review of Systems: A 12 point ROS discussed and pertinent positives are indicated in the HPI above.  All other systems are negative.  Review of Systems  Constitutional: Negative for fatigue and fever.  Respiratory: Negative for cough and shortness of breath.   Cardiovascular: Negative for chest pain.  Gastrointestinal: Negative for abdominal pain, nausea and vomiting.  Genitourinary:  Negative for dysuria.  Musculoskeletal: Negative for back pain.  Psychiatric/Behavioral: Negative for behavioral problems and confusion.    Vital Signs: There were no vitals taken for this visit.  Physical Exam Vitals and nursing note reviewed.  Constitutional:      General: He is not in acute distress.    Appearance: Normal appearance. He is not ill-appearing.  HENT:     Mouth/Throat:     Mouth: Mucous membranes are moist.     Pharynx: Oropharynx is clear.  Cardiovascular:     Rate and Rhythm: Normal rate and regular rhythm.  Pulmonary:     Effort: Pulmonary effort is normal. No respiratory distress.     Breath sounds: Normal breath sounds.  Abdominal:     General: Abdomen is flat. There is no distension.     Palpations: Abdomen is soft.  Skin:    General: Skin is warm and dry.  Neurological:     General: No focal deficit present.     Mental Status: He is alert and oriented to person, place, and time. Mental status is at baseline.  Psychiatric:        Mood and Affect: Mood normal.        Behavior: Behavior normal.        Thought Content: Thought content normal.        Judgment: Judgment normal.   Neuro: EOMs intact, PERRL, moving all extremities. Pulses: Radial pulse 2+ bilaterally   MD Evaluation Airway: WNL Heart: Other (comments) (afib) Abdomen: WNL Chest/ Lungs: WNL ASA  Classification: 3 Mallampati/Airway Score: Two   Imaging: No results found.  Labs:  CBC: Recent Labs    06/24/19 0353 06/25/19 0220 06/26/19 0349 08/05/19 0659  WBC 33.9* 16.9* 9.8 8.3  HGB 13.4 12.3* 11.3* 13.0  HCT 41.9 37.7* 35.1* 41.7  PLT 170 151 129* 234    COAGS: Recent Labs    06/20/19 1935 06/20/19 1935 06/21/19 2101 06/22/19 0509 06/23/19 0647 06/24/19 0353 06/25/19 0220 06/26/19 0349 08/05/19 0659  INR 1.0   < >  --  1.3*   < > 1.3* 1.4* 1.3* 1.1  APTT 27  --  69* 75*  --   --   --   --  29   < > = values in this interval not displayed.     BMP: Recent Labs    06/24/19 0353 06/25/19 0220 06/26/19 0747 08/05/19 0659  NA 135 134* 135 140  K 3.6 3.7 3.9 3.8  CL 106 104 107 106  CO2 19* 21* 18* 22  GLUCOSE 96 96 88 163*  BUN 33* 47* 38* 14  CALCIUM 7.9* 7.8* 7.8* 9.6  CREATININE 3.02* 3.40* 1.86* 1.01  GFRNONAA 21* 18* 37* >60  GFRAA 24* 21* 43* >60    LIVER FUNCTION TESTS: Recent Labs    06/20/19 1935 06/24/19 0353  BILITOT 1.0 1.0  AST 29 30  ALT 67* 31  ALKPHOS 57 64  PROT 6.6 5.5*  ALBUMIN 3.5 2.4*    TUMOR MARKERS: No results for input(s): AFPTM, CEA, CA199, CHROMGRNA in the last 8760 hours.  Assessment and Plan: Patient with past medical history of L M1 occlusion presents with complaint of ACOM aneurysm.   He has met with Dr. Karenann Cai in consultation to discuss treatment of this aneurysm.  His procedure was initially delayed due to an acute UTI.  SCr 1.01  Patient presents today in their usual state of health.  He has been NPO and is not currently on blood thinners as he has held his Xarelto since 6/29.   Risks and benefits of ACOM aneurysm treatment were discussed with the patient including, but not limited to bleeding, infection, vascular injury or contrast induced renal failure.  This interventional procedure involves the use of X-rays and because of the nature of the planned procedure, it is possible that we will have prolonged use of X-ray fluoroscopy.  Potential radiation risks to you include (but are not limited to) the following: - A slightly elevated risk for cancer  several years later in life. This risk is typically less  than 0.5% percent. This risk is low in comparison to the normal incidence of human cancer, which is 33% for women and 50% for men according to the [facility]. - Radiation induced injury can include skin redness, resembling a rash, tissue breakdown / ulcers and hair loss (which can be temporary or permanent).   The likelihood of either of  these occurring depends on the difficulty of the procedure and whether you are sensitive to radiation due to previous procedures, disease, or genetic conditions.   IF your procedure requires a prolonged use of radiation, you will be notified and given written instructions for further action.  It is your responsibility to monitor the irradiated area for the 2 weeks following the procedure and to notify your physician if you are concerned that you have suffered a radiation induced injury.    All of the patient's questions were answered, patient is agreeable to proceed.  Consent signed and in chart.  Thank you for this interesting consult.  I greatly enjoyed meeting [facility] and look forward to participating in their care.  A copy of this report was sent to the requesting provider on this date.  Electronically Signed: Docia Barrier, PA 08/05/2019, 8:47 AM   I spent a total of  30 Minutes   in face to face in clinical consultation, greater than 50% of which was counseling/coordinating care for anterior communicating artery aneurysm.

## 2019-08-05 NOTE — Anesthesia Procedure Notes (Addendum)
Arterial Line Insertion Start/End7/07/2019 8:40 AM, 08/05/2019 8:55 AM Performed by: Jeani Hawking, CRNA, CRNA  Patient location: Pre-op. Preanesthetic checklist: patient identified, IV checked, site marked, risks and benefits discussed, surgical consent, monitors and equipment checked, pre-op evaluation, timeout performed and anesthesia consent Lidocaine 1% used for infiltration Left, radial was placed Catheter size: 20 G Maximum sterile barriers used   Attempts: 1 Procedure performed without using ultrasound guided technique. Following insertion, dressing applied and Biopatch. Post procedure assessment: unchanged  Patient tolerated the procedure well with no immediate complications.

## 2019-08-05 NOTE — Progress Notes (Addendum)
NIR Brief Rounding Note:  History of unruptured ACOM aneurysm s/p embolization using WEB device via right femoral approach 08/05/2019 by Dr. Tommie Sams.  Received page from RN regarding oozing from right femoral puncture site. Went to evaluate patient bedside. Per Junious Dresser, RN- right femoral puncture site with palpable firmness, dressing was removed and manual pressure held, firmness pushed out, oozing stopped, site redressed and distal pulses stable. On my exam, patient awake and alert laying in bed, right femoral puncture site soft with minimal tenderness, no active bleeding or hematoma. Distal pulses (DPs) 1+ bilaterally.  In addition, patient noted to have minimal amounts of blood in condom catheter (condom catheter itself, not bag), no urine output in foley bag. Discussed with Dr. Tommie Sams who states difficulty with urianry catheterization earlier, therefore condom catheter was placed. Instructed RN to page Dr. Tommie Sams if hematuria noted.  Junious Dresser, RN with questions regarding BP parameters. Discussed with Dr. Tommie Sams who recommends BP goal = 120-140 systolic, Cleviprex for BP management.  NIR to follow.   ADDENDUM: Went to evaluate patient bedside alongside Dr. Tommie Sams. Patient awake and alert laying in bed with no complaints at this time. Accompanied by daughter at bedside who acted as Nurse, learning disability during interaction. Junious Dresser, RN at bedside.  Alert, awake, and oriented x3. Speech and comprehension intact. PERRL bilaterally. No facial asymmetry. Tongue midline. Can spontaneously move all extremities. No pronator drift. Fine motor and coordination intact and symmetric. Distal pulses (DPs) 1+ bilaterally. Right femoral puncture site soft without active bleeding or hematoma.  Plan to stay in neuro ICU for overnight observation. Advance diet to regular as tolerated. Bladder scan if no urinary output notedJunious Dresser, RN aware. NIR to  follow.   Blake Boga Sumaiyah Markert, PA-C 08/05/2019, 4:16 PM

## 2019-08-05 NOTE — Progress Notes (Signed)
Per Marchelle Folks in lab, P2Y12 analyzer is not able to process the lab.  Dr. Tommie Sams notified and can proceed without results.

## 2019-08-05 NOTE — Transfer of Care (Signed)
Immediate Anesthesia Transfer of Care Note  Patient: Raedyn Clarida  Procedure(s) Performed: ANEURYSM EMBOLIZATION (N/A )  Patient Location: PACU  Anesthesia Type:General  Level of Consciousness: awake, alert , oriented and patient cooperative  Airway & Oxygen Therapy: Patient Spontanous Breathing and Patient connected to nasal cannula oxygen  Post-op Assessment: Report given to RN and Post -op Vital signs reviewed and stable  Post vital signs: Reviewed and stable  Last Vitals:  Vitals Value Taken Time  BP    Temp    Pulse 107 08/05/19 1242  Resp 18 08/05/19 1242  SpO2 100 % 08/05/19 1242  Vitals shown include unvalidated device data.  Last Pain:  Vitals:   08/05/19 0726  TempSrc:   PainSc: 0-No pain         Complications: No complications documented.

## 2019-08-05 NOTE — Anesthesia Postprocedure Evaluation (Signed)
Anesthesia Post Note  Patient: Blake Carey  Procedure(s) Performed: ANEURYSM EMBOLIZATION (N/A )     Patient location during evaluation: PACU Anesthesia Type: General Level of consciousness: awake and alert Pain management: pain level controlled Vital Signs Assessment: post-procedure vital signs reviewed and stable Respiratory status: spontaneous breathing, nonlabored ventilation, respiratory function stable and patient connected to nasal cannula oxygen Cardiovascular status: blood pressure returned to baseline and stable Postop Assessment: no apparent nausea or vomiting Anesthetic complications: no   No complications documented.  Last Vitals:  Vitals:   08/05/19 1400 08/05/19 1415  BP: 134/79 122/81  Pulse: (!) 40 (!) 57  Resp: 16 15  Temp: (!) 36.3 C   SpO2: 98% 98%    Last Pain:  Vitals:   08/05/19 1345  TempSrc:   PainSc: 0-No pain                 Murrel Bertram DAVID

## 2019-08-05 NOTE — Progress Notes (Signed)
Assessed pt's groin site at 1700 and found it to have slight bleeding.  Marked site and held pressure for 15 minutes.  Site was stable at next assessments.  At 1800, bleeding area had expanded very slightly but there was a new firmness and slight swelling lateral to the right groin site. Dr. Tommie Sams was paged and notified of patient status.  Noted that patient was able to void on his own and urine was clear. Will continue to monitor patient and keep HOB flat until site is stable. Jalaya Sarver C 6:31 PM

## 2019-08-05 NOTE — Procedures (Signed)
INTERVENTIONAL NEURORADIOLOGY BRIEF POSTPROCEDURE NOTE  DIAGNOSTIC CEREBRAL ANGIOGRAM AND ENDOVASCULAR EMBOLIZATION  Attending: Dr. Baldemar Lenis  Assistant: None  Diagnosis: Anterior com art. Aneurysm, unruptured  Access site: RCFA  Access closure: Perclose proglide  Anesthesia: General  Medication used: refer to anesthesia documentation.  Complications: None  Estimated blood loss: Minimal  Specimen: None  Findings: 5 mm anterior communicating artery aneurysm. Embolization performed with 6 x 2 mm WEB device. No thromboembolic complication.  The patient tolerated the procedure well without incident or complication and is in stable condition.

## 2019-08-06 ENCOUNTER — Encounter (HOSPITAL_COMMUNITY): Payer: Self-pay | Admitting: Neuroradiology

## 2019-08-06 LAB — CBC WITH DIFFERENTIAL/PLATELET
Abs Immature Granulocytes: 0.04 10*3/uL (ref 0.00–0.07)
Basophils Absolute: 0.1 10*3/uL (ref 0.0–0.1)
Basophils Relative: 1 %
Eosinophils Absolute: 0.1 10*3/uL (ref 0.0–0.5)
Eosinophils Relative: 1 %
HCT: 35.4 % — ABNORMAL LOW (ref 39.0–52.0)
Hemoglobin: 11 g/dL — ABNORMAL LOW (ref 13.0–17.0)
Immature Granulocytes: 0 %
Lymphocytes Relative: 22 %
Lymphs Abs: 2.8 10*3/uL (ref 0.7–4.0)
MCH: 25.9 pg — ABNORMAL LOW (ref 26.0–34.0)
MCHC: 31.1 g/dL (ref 30.0–36.0)
MCV: 83.3 fL (ref 80.0–100.0)
Monocytes Absolute: 1 10*3/uL (ref 0.1–1.0)
Monocytes Relative: 8 %
Neutro Abs: 8.9 10*3/uL — ABNORMAL HIGH (ref 1.7–7.7)
Neutrophils Relative %: 68 %
Platelets: 201 10*3/uL (ref 150–400)
RBC: 4.25 MIL/uL (ref 4.22–5.81)
RDW: 15.9 % — ABNORMAL HIGH (ref 11.5–15.5)
WBC: 12.9 10*3/uL — ABNORMAL HIGH (ref 4.0–10.5)
nRBC: 0 % (ref 0.0–0.2)

## 2019-08-06 LAB — BASIC METABOLIC PANEL
Anion gap: 8 (ref 5–15)
BUN: 10 mg/dL (ref 8–23)
CO2: 24 mmol/L (ref 22–32)
Calcium: 8.4 mg/dL — ABNORMAL LOW (ref 8.9–10.3)
Chloride: 108 mmol/L (ref 98–111)
Creatinine, Ser: 0.89 mg/dL (ref 0.61–1.24)
GFR calc Af Amer: >60 mL/min (ref >60)
GFR calc non Af Amer: >60 mL/min (ref >60)
Glucose, Bld: 131 mg/dL — ABNORMAL HIGH (ref 70–99)
Potassium: 3.4 mmol/L — ABNORMAL LOW (ref 3.5–5.1)
Sodium: 140 mmol/L (ref 135–145)

## 2019-08-06 LAB — MRSA PCR SCREENING: MRSA by PCR: NEGATIVE

## 2019-08-06 LAB — POCT ACTIVATED CLOTTING TIME
Activated Clotting Time: 147 seconds
Activated Clotting Time: 274 seconds
Activated Clotting Time: 263 seconds

## 2019-08-06 MED ORDER — RIVAROXABAN 20 MG PO TABS
20.0000 mg | ORAL_TABLET | Freq: Every day | ORAL | Status: DC
  Filled 2019-08-06: qty 1

## 2019-08-06 NOTE — Plan of Care (Signed)
Pt meets all care plan outcomes and is ready for discharge

## 2019-08-06 NOTE — Discharge Instructions (Signed)
Terapia endovascular para un aneurisma cerebral Endovascular Therapy for Cerebral Aneurysm  Un aneurisma cerebral es un punto dbil en un vaso sanguneo (arteria) en el cerebro. El punto dbil se llena de sangre y se hincha. Esto puede ejercer presin United Stationers nervios u otros vasos sanguneos. Si el aneurisma estalla (se rompe), puede causar una hemorragia en el cerebro (hemorragia cerebral). Una hemorragia cerebral es una emergencia mdica que puede ocasionar graves problemas de salud o la [place]. La terapia endovascular es un procedimiento que llena el aneurisma con pequeas espirales de alambre de platino. Las espirales destruyen el aneurisma impidiendo que fluya sangre hacia l. Este tratamiento tambin se llama espiral endovascular o embolizacin con Web device.  En algunos casos, es posible que sea necesario repetir el procedimiento. Informe al mdico acerca de lo siguiente:  Cualquier alergia que tenga.  Todos los Walt Disney, incluidos vitaminas, hierbas, gotas oftlmicas, cremas y [address] de [address].  Cualquier problema previo que usted o los miembros de su familia hayan tenido con anestsicos.  Cualquier enfermedad de la sangre que tenga.  Cualquier enfermedad que tenga.  Cirugas previas a las que se someti.  Si est embarazada o podra estarlo.  Si fuma o consume productos que contengan tabaco. Cules son los riesgos? En general, se trata de un procedimiento seguro. Sin embargo, pueden ocurrir complicaciones, por ejemplo:  Infeccin.  Hemorragia.  Ictus.  Lesin cerebral, que puede [employer] de visin o del habla, prdida de la memoria o problemas de [place] y pensamiento.  Daos a Systems developer u otros rganos.  Cogulos de [place].  Reaccin alrgica a los medicamentos o a las sustancias de [place]. Qu ocurre antes del procedimiento? Mantenerse hidratado Siga las indicaciones del mdico acerca de la hidratacin, las  cuales pueden incluir lo siguiente:  Hasta 2horas antes del procedimiento, puede beber lquidos transparentes, como agua, jugos frutales transparentes, caf negro y t solo. Restricciones en las comidas y bebidas Siga las indicaciones del mdico respecto de lo que puede comer o beber, las cuales pueden incluir lo siguiente:  Ocho horas antes del procedimiento, deje de ingerir comidas o alimentos pesados, por ejemplo, carne, alimentos fritos o alimentos grasos.  Seis horas antes del procedimiento, deje de ingerir comidas o alimentos livianos, como tostadas o cereales.  Seis horas antes del procedimiento, deje de beber [place] o bebidas que [employer].  Dos horas antes del procedimiento, deje de beber lquidos transparentes. Medicamentos  Consulte al mdico si debe hacer o no lo siguiente: ? Cambiar o suspender los medicamentos que toma habitualmente. Esto es muy importante si toma medicamentos para la diabetes o anticoagulantes. ? Tomar medicamentos de [employer], vitaminas, hierbas y suplementos. ? Tomar medicamentos como aspirina e ibuprofeno. Estos medicamentos pueden tener un efecto anticoagulante en la [place]. No tome estos medicamentos a menos que el mdico se lo indique.  Pueden indicarle un antibitico para ayudar a prevenir infecciones. Instrucciones generales  Es posible que le pidan que se bae con un jabn antibacterial.  Pueden extraerle Lauris Poag de [place] o pedirle [place] de [place].  No consuma ningn producto que contenga nicotina o tabaco, como cigarrillos y Administrator, Civil Service. Si necesita ayuda para dejar de fumar, consulte al [employer].  Es posible que le realicen un examen neurolgico o una prueba para controlar la funcin cerebral y del sistema nervioso.  Es posible que le realicen estudios de diagnstico por imgenes, como exploraciones por tomografas computarizadas (TC) o resonancias magnticas (RM). Qu ocurre durante el procedimiento?  Para  reducir el riesgo de infecciones: ? El equipo mdico se lavar o se desinfectar las manos. ? Pueden rasurarle el rea alrededor del lugar de la ciruga. ? Le lavarn la piel en el rea de la ingle con jabn.  Le colocarn una va intravenosa (IV) en una de las venas.  Le administrarn uno o ms de los siguientes medicamentos: ? Un medicamento para ayudarlo a relajarse (sedante). ? Un medicamento para adormecer la zona (anestesia local). ? Un medicamento que lo har dormir (anestesia general).  Se le realizar una pequea incisin cerca de la ingle, sobre una de las principales arterias que puede usarse para llegar al cerebro (arteria femoral). En casos poco frecuentes, la incisin se Visual merchandiserrealizar en el brazo.  Se insertar un tubo pequeo y delgado (catter) en la arteria femoral.  Se inyectar un tinte por el catter y, luego, se tomarn radiografas (angiografa). El tinte ayuda a Scientist, clinical (histocompatibility and immunogenetics)mostrar la ubicacin del catter y Programme researcher, broadcasting/film/videoel aneurisma en las radiografas.  El catter se Psychologist, forensicdesplazar hasta el aneurisma, usando imgenes de radiografa como gua.  Se utilizar un pequeo alambre (alambre gua) para pasar pequeas espirales de platino a travs del catter [facility] el aneurisma.  Una vez que las espirales se colocan dentro del Rio Verdeaneurisma, se enviar una pequea corriente elctrica a travs del alambre gua. Esto har que las espirales se separen del cable. Las Sara Leeespirales permanecern dentro del aneurisma, bloqueando el flujo de sangre al aneurisma.  En algunos casos, se puede insertar un pequeo baln (catter con baln) o una cesta flexible (stent) en la arteria. Esto ayudar a que las Herbalistespirales permanezcan en su lugar si la abertura del aneurisma es grande.  El alambre gua y el catter se retirarn.  La incisin se cerrar con un tapn, grapas quirrgicas o puntos (suturas) y, luego, se cubrir con una venda (vendaje). Este procedimiento puede variar segn el mdico y el hospital. Ladell HeadsQu sucede despus  del procedimiento?  Ser trasladado a una unidad de cuidados intensivos (UCI). Le controlarn la presin arterial, la frecuencia cardaca, la frecuencia respiratoria y Air cabin crewel nivel de oxgeno en la sangre hasta que desaparezca el efecto de los medicamentos administrados.  Tendr que hacer actividades limitadas.  Quizs Huntsman Corporationdeba usar medias de compresin. Estas medias ayudan a Transport plannerevitar la formacin de cogulos de [place]angre y a reducir la hinchazn de las piernas.  No conduzca hasta que el mdico lo autorice. Resumen  Un aneurisma cerebral es un punto dbil en un vaso sanguneo en el cerebro. Cuando el aneurisma se llena de Catherinesangre, puede ejercer presin United Stationerssobre los nervios u otros vasos sanguneos.  Si el aneurisma cerebral no se trata, puede romperse y causar una hemorragia en el cerebro (hemorragia cerebral).  En algunos casos, puede que sea necesario repetir Liberty Mediaeste tratamiento.  Despus de este procedimiento, se lo Statisticiancontrolar en la UCI. Tendr restricciones de Hess Corporationactividad mientras su cuerpo se recupera. Esta informacin no tiene Theme park managercomo fin reemplazar el consejo del mdico. Asegrese de hacerle al mdico cualquier pregunta que tenga. Document Revised: 09/10/2016 Document Reviewed: 09/10/2016 Elsevier Patient Education  2020 Elsevier Inc.    Terapia endovascular para un aneurisma cerebral, cuidados posteriores Endovascular Therapy for Cerebral Aneurysm, Care After Esta hoja le brinda informacin sobre cmo cuidarse despus del procedimiento. El mdico tambin podr darle instrucciones ms especficas. Comunquese con el mdico si tiene problemas o preguntas. Qu puedo esperar despus del procedimiento? Despus del procedimiento, es comn DIRECTVtener los siguientes sntomas:  Dolor, dolor a la palpacin e hinchazn alrededor de la incisin.  Dolores de [place]. Sigue estas instrucciones en tu casa: Medicamentos  Use los medicamentos de venta libre y los recetados solamente como se lo haya indicado el  mdico.  Si le recetaron medicamentos para prevenir los cogulos de sangre (medicamentos antiplaquetarios), hable con su mdico acerca de los riesgos. Estos medicamentos pueden aumentar el sangrado, por lo que es posible que deba evitar ciertas actividades. Cuidado de la incisin   Siga las instrucciones del mdico acerca del cuidado de la incisin. Asegrese de hacer lo siguiente: ? Lvese las manos con agua y jabn antes de Multimedia programmer las vendas (vendaje). Use desinfectante para manos si no dispone de [place] y [place]. ? Cambie los vendajes como se lo haya indicado el mdico. ? No retire los puntos (suturas), la goma para cerrar la piel o las tiras [place]. Es posible que estos cierres cutneos deban quedar puestos en la piel durante 2semanas o ms tiempo. Si los bordes de las tiras [address] empiezan a despegarse y Scientific laboratory technician, puede recortar los que estn sueltos. No retire las tiras [employer] por completo a menos que el mdico se lo indique.  Controle todos los das la zona de la incisin para detectar signos de infeccin. Est atento a los siguientes signos: ? Enrojecimiento, hinchazn o dolor. ? Lquido o sangre. ? Calor. ? Pus o mal olor. Actividad  Pregntele al mdico qu actividades son seguras para usted mientras se recupera. La Harley[employer]Davidson de las personas puede reanudar sus actividades normales entre 2 y Health visitor despus del procedimiento.  No conduzca hasta que el mdico lo autorice.  No conduzca ni use maquinaria pesada mientras toma analgsicos recetados.  No levante ningn objeto que pese ms de 10libras (4.5kg) o que supere el lmite de peso que le hayan indicado, hasta que el mdico le diga que puede [place].  Haga ejercicio regularmente como se lo haya indicado el mdico.  Asista a la terapia de rehabilitacin segn se lo haya indicado el mdico. Puede incluir: ? Fisioterapia y terapia ocupacional. ? Terapia del lenguaje y del habla. ? Ejercicios para el  cerebro. ? Ejercicios de equilibrio. ? Terapia individual o grupal. ? Educacin sobre su afeccin y [place]. Comida y bebida   Beba suficiente lquido como para Pharmacologist la orina de color amarillo plido.  Siga una dieta saludable. Esta debe incluir muchas frutas y verduras, cereales integrales, productos lcteos bajos en grasa y protenas con bajo contenido de grasa. Indicaciones generales  No consuma ningn producto que contenga nicotina o tabaco, como cigarrillos y Administrator, Civil Service. Si necesita ayuda para dejar de consumir, consulta al mdico.  No tome baos de inmersin, no nade ni use el jacuzzi hasta que el mdico lo autorice. Pregntele al mdico si puede ducharse. Delle Reining solo le permitan tomar baos de [place].  Controle el estrs. Si necesita ayuda para lograr esto, hable con su mdico.  Use medias de compresin como se lo haya indicado su mdico. Estas medias ayudan a evitar la formacin de cogulos de sangre y a reducir la hinchazn de las piernas.  Concurrir a todas las visitas de [address] se lo haya indicado el mdico. Esto es importante. Comuncate con un mdico si:  Tiene enrojecimiento, hinchazn o dolor alrededor de la incisin.  Presenta lquido o sangre provenientes de la incisin.  Su incisin se siente caliente al tacto.  Tiene pus o percibe que sale mal olor de su incisin.  Tener fiebre. Solicite ayuda inmediatamente si:   Tiene lo siguiente: ? Entumecimiento en el cuello. ? Dolor, adormecimiento,  debilidad o hinchazn en las piernas. ? Dolor de [employer]. ? Dificultad para respirar. ? Confusin.  Tiene sntomas de un accidente cerebrovascular. El acrnimo "BE FAST" es una manera fcil de recordar las seales de advertencia principales de un accidente cerebrovascular: ? B - Balance (equilibrio). Los signos son mareo, dificultad repentina para caminar o prdida del equilibrio. ? E - Eyes (ojos). Los signos son problemas para  ver o un cambio repentino en la visin. ? F - Face (rostro). Los signos son debilidad repentina o adormecimiento del rostro, o el rostro o el prpado que se caen hacia un lado. ? A - Arms (brazos). Los signos son debilidad o adormecimiento en un brazo. Esto sucede de repente y generalmente en un lado del cuerpo. ? S - Speech (habla). Los signos son dificultad para hablar, hablar arrastrando las palabras o dificultad para comprender lo que la gente dice. ? T - Time (tiempo). Es tiempo de llamar al servicio de Sports administrator. Anote la hora a la que Alber[employer]on's sntomas.  Presenta otros signos de accidente cerebrovascular, como los siguientes: ? Dolor de [place] repentino e intenso que no mejora con medicamentos. ? Nuseas o vmitos repentinos. ? Una convulsin. Estos sntomas pueden representar un problema grave que constituye Radio broadcast assistant. No espere a ver si los sntomas desaparecen. Solicite atencin mdica de inmediato. Comunquese con el servicio de emergencias de su localidad (911 en los [place]). No conduzca por sus propios medios [employer]Max [employer]. Resumen  Despus de este procedimiento, es habitual sentir un poco de dolor y presentar hinchazn alrededor de la incisin.  Siga las instrucciones del mdico acerca del cuidado de la incisin. Observe si hay signos de infeccin [employer].  La Harley-Davidson de las personas puede reanudar sus actividades normales entre 2 y Health visitor despus del procedimiento. Pregntele al mdico qu actividades son seguras para usted mientras se recupera.  No conduzca hasta que el mdico lo autorice. No conduzca mientras toma analgsicos recetados.  No consuma ningn producto que contenga nicotina o tabaco, como cigarrillos y Administrator, Civil Service. Si necesita ayuda para dejar de consumir, consulta al mdico. Esta informacin no tiene Theme park manager el consejo del mdico. Asegrese de hacerle al mdico cualquier pregunta que tenga. Document  Revised: 03/12/2018 Document Reviewed: 09/10/2016 Elsevier Patient Education  2020 [employer].

## 2019-08-06 NOTE — Discharge Summary (Signed)
Patient ID: Blake Carey MRN: [id number] DOB/AGE: [birth year] 66 y.o.  Admit date: 08/05/2019 Discharge date: 08/06/2019  Supervising Physician: Baldemar Lenis  Patient Status: [facility] - In-pt  Admission Diagnoses: ACOM aneurysm  Discharge Diagnoses:  Active Problems:   Brain aneurysm   Discharged Condition: good  Hospital Course:  Blake Carey is a 64 y.o. male who presented to [facility]19 with acute onset right hemiplegia and phasia, found to have acute L M1 occlusion treated with thrombectomy achieving TICI 3 after 1 pass aspiration by Dr. Loreta Ave.  He was also found to have a 89mm saccular aneurysm of the ACOM.  He was evaluated for treatment of this aneurysm and this was planned for 5/25, however he was found to have an acute UTI with elevated WBC. He returned to [facility] yesterday after resolve of his acute UTI for planned treatment of his ACOM aneurysm. He is s/p angiogram with embolization using WEB device. He tolerated the procedure well and was admitted overnight for obervation.  He is assessed this AM and found to be in stable condition.  He did have prolonged bleeding from his groin yesterday evening which resolved with holding pressure and extended bed rest.  His groin is soft, compressible with areas of swelling but no definite hematoma or evidence of pseudoaneurysm.  He has ambulated in his room without difficulty.  He has tolerated a diet this AM.  He has voided yellow, clear urine without difficulty.  He is ready for discharge home in the care of his family. Blake Carey is given care instructions at home for the next 7-14 days.  He should stop the Brilinta, resume Xarelto 20mg  daily, and continue the aspirin 81mg  daily.   Defer to Neurology regarding his transition to coumadin from Xarelto. He understands to reach out to their office to discuss.   We will plan to see him in follow-up in 1 month with [facility]. Office visit to follow imaging.  A follow-up diagnostic  angiogram will be planned for 6 months from procedure date.   Discharge Exam: Blood pressure 127/67, pulse (!) 59, temperature 97.9 F (36.6 C), temperature source Oral, resp. rate 15, height 5\' 3"  (1.6 m), weight 155 lb (70.3 kg), SpO2 100 %. General appearance: alert, cooperative and no distress Cardio: regular rate and rhythm, S1, S2 normal, no murmur, click, rub or gallop GI: soft, non-tender; bowel sounds normal; no masses,  no organomegaly Skin: Skin color, texture, turgor normal. No rashes or lesions Incision/Wound: Groin with area of swelling, soft. Compressible.  No hematoma or evidence of pseudoaneurysm.  No bleeding or oozing.  Pulses: DP pulses palpable, 2+ bilaterally  Disposition: Discharge disposition: 01-Home or Self Care       Discharge Instructions    Call MD for:  difficulty breathing, headache or visual disturbances   Complete by: As directed    Call MD for:  extreme fatigue   Complete by: As directed    Call MD for:  persistant dizziness or light-headedness   Complete by: As directed    Call MD for:  redness, tenderness, or signs of infection (pain, swelling, redness, odor or green/yellow discharge around incision site)   Complete by: As directed    Call MD for:  severe uncontrolled pain   Complete by: As directed    Call MD for:  temperature >100.4   Complete by: As directed    Diet - low sodium heart healthy   Complete by: As directed    Increase  activity slowly   Complete by: As directed    Remove dressing in 24 hours   Complete by: As directed    May remove dressing over groin site tomorrow.  Monitor for worsening swelling, increased pain, or increased bruising. If bleeding occurs, immediately lie flat and hold firm pressure.  Do not drive, lift more than 10 lbs, strain, or exercise for at least 1 week.  Schedulers will contact you with date and time of follow-up appointments and imaging.     Allergies as of 08/06/2019      Reactions   Contrast  Media [iodinated Diagnostic Agents] Other (See Comments)   Affects Kidney Function       Medication List    STOP taking these medications   cephALEXin 500 MG capsule Commonly known as: KEFLEX   ticagrelor 90 MG Tabs tablet Commonly known as: BRILINTA     TAKE these medications   amLODipine 5 MG tablet Commonly known as: NORVASC Take 0.5 tablets (2.5 mg total) by mouth daily.   aspirin EC 81 MG tablet Take 81 mg by mouth daily. Swallow whole.   atorvastatin 40 MG tablet Commonly known as: LIPITOR Take 1 tablet (40 mg total) by mouth daily at 6 PM.   benzonatate 100 MG capsule Commonly known as: TESSALON Take 1 capsule (100 mg total) by mouth 3 (three) times daily as needed.   diphenhydrAMINE 50 MG tablet Commonly known as: BENADRYL Take 50 mg by mouth See admin instructions. Patient to take 1 tab 1 hour prior to procedure   ferrous sulfate 325 (65 FE) MG tablet Take 325 mg by mouth 2 (two) times daily. PCP advised him to take this for 3 months. (Started June 2021)   predniSONE 50 MG tablet Commonly known as: DELTASONE Take 50 mg by mouth See admin instructions. Prior to procedure, patient to take 1 tab, 13 hours, 7 hours and 1 hour prior to procedure   rivaroxaban 20 MG Tabs tablet Commonly known as: XARELTO Take 1 tablet (20 mg total) by mouth daily with supper. Take with the biggest meal of the day at the same time every day. Do not miss a dose. Set your alarm to remind you to take your Xarelto.   tamsulosin 0.4 MG Caps capsule Commonly known as: FLOMAX Take 1 capsule (0.4 mg total) by mouth daily.       Follow-up Information    de Glori Luis, MD Follow up.   Specialties: Radiology, Interventional Radiology Why: Schedulers will contact you with date and time of follow-up appointments.  Contact information: [address] [place] [address] [phone]                Electronically Signed: Hoyt Koch,  PA 08/06/2019, 11:50 AM   I have spent Greater Than 30 Minutes discharging Blake Carey.

## 2019-08-06 NOTE — Progress Notes (Signed)
Pt given discharge instructions, including all instruction on medication after discharged with wife and daughter at bedside. Spanish interpretor Graciela present at bedside to translate. Pt and family all verbalize understanding and all questions answered.

## 2019-08-25 ENCOUNTER — Other Ambulatory Visit: Payer: Self-pay

## 2019-08-25 ENCOUNTER — Encounter: Payer: Self-pay | Admitting: Neurology

## 2019-08-25 ENCOUNTER — Ambulatory Visit: Payer: Self-pay | Admitting: Neurology

## 2019-08-25 VITALS — BP 147/91 | HR 74 | Ht 63.0 in | Wt 156.0 lb

## 2019-08-25 DIAGNOSIS — I63412 Cerebral infarction due to embolism of left middle cerebral artery: Secondary | ICD-10-CM

## 2019-08-25 DIAGNOSIS — I671 Cerebral aneurysm, nonruptured: Secondary | ICD-10-CM

## 2019-08-25 NOTE — Progress Notes (Signed)
[facility] [address] [place]. [place] [address] [phone]       OFFICE FOLLOW-UP NOTE  Mr. Blake Carey Date of Birth:  [birth year] Medical Record Number:  [id number]   HPI: Blake Carey is a 64 year old Hispanic male seen today for initial office follow-up visit following hospital admission for stroke in May 2021.  Is accompanied by his daughter Blake Carey who translates for him today with his permission.  I personally reviewed history of presenting illness, electronic medical records and imaging films in PACS. Blake Torresis an 64 y.o.malewith a history ofHTN anda-fib, on Eliquis, who presents via EMS as a Code Stroke after acute onset of right hemiplegia and aphasia while eating at a restaurant this evening.Time of onset is the same as LKW: 1845. He was witnessed by family to become non-verbal and not acting right, as well as unable to follow instructions.He has no prior history of stroke.He was not a tPA candidate as he was on eliquis. Found to have a L M1 LVO. Sent to IR for mechanical thrombectomy. With TICI 3 revascularization by Dr Loreta Ave.   Patient did very well and was kept in the ICU and extubated and had no physical deficits on exam.  CT angiogram is also shown a 5 mm anterior communicating artery aneurysm which was incidental.  MRI scan of the brain showed tiny scattered acute infarcts in the left MCA distribution mostly in the basal ganglia and punctate acute high right frontal incidental infarct.  MRA showed patent left M1 segment after treatment.  2D echo showed normal ejection fraction.  LDL cholesterol was elevated at 143 mg percent and hemoglobin A1c at 6.0.  Urine drug screen was negative.  Patient was on Eliquis prior to admission he was initially treated with IV heparin with plans to switch to warfarin however since patient was discharged over the weekend there was lack of ability to check his INR and to contact primary care physician to do this so  ends transition of care pharmacy arrange instead to give the patient 30-day free supply of Xarelto and patient was discharged on this with plans to switch to warfarin after being followed by primary care physician who would also follow INR.  Patient also returned subsequently and saw Dr. Whitney Post who acquired is asymptomatic a common rhythm on 08/05/2019 and procedure was uneventful.  He was started on aspirin 81 in addition to the Xarelto.  Patient is tolerating both medications well with minor bruising but no bleeding.  He is recovered back to his baseline and has no complaints today.  Blood pressures well controlled at home though today it is slightly elevated in the office at 147/91.  Is tolerating Lipitor well without muscle aches and pains.  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  He has not yet seen his primary care physician to set up switching Xarelto to warfarin and have regular INR checks.  He states he cannot afford the co-pay for his Xarelto  ROS:   14 system review of systems is positive for no complaints today  PMH:  Past Medical History:  Diagnosis Date  . AKI (acute kidney injury) (HCC)    05/2019 (contrast nephropathy versus obstructive urinary retention with UTI)  . Anemia    low iron  . Atrial fibrillation (HCC)   . Cerebral aneurysm    ACOM aneurysm 06/21/19  . HTN (hypertension)   . Stroke [facility])    06/20/19    Social  History:  Social History   Socioeconomic History  . Marital status: Unknown    Spouse name: Not on file  . Number of children: Not on file  . Years of education: Not on file  . Highest education level: Not on file  Occupational History  . Not on file  Tobacco Use  . Smoking status: Never Smoker  . Smokeless tobacco: Never Used  Vaping Use  . Vaping Use: Never used  Substance and Sexual Activity  . 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 Drug use: Never  . Sexual activity: Not on  file  Other Topics Concern  . Not on file  Social History Narrative  . Not on file   Social Determinants of Health   Financial Resource Strain:   . Difficulty of Paying Living Expenses:   Food Insecurity:   . Worried About Programme researcher, broadcasting/film/video in the Last Year:   . Barista in the Last Year:   Transportation Needs:   . Freight forwarder (Medical):   Marland Kitchen Lack of Transportation (Non-Medical):   Physical Activity:   . Days of Exercise per Week:   . Minutes of Exercise per Session:   Stress:   . Feeling of Stress :   Social Connections:   . Frequency of Communication with Friends and Family:   . Frequency of Social Gatherings with Friends and Family:   . Attends Religious Services:   . Active Member of Clubs or Organizations:   . Attends Banker Meetings:   Marland Kitchen Marital Status:   Intimate Partner Violence:   . Fear of Current or Ex-Partner:   . Emotionally Abused:   Marland Kitchen Physically Abused:   . Sexually Abused:     Medications:   Current Outpatient Medications on File Prior to Visit  Medication Sig Dispense Refill  . amLODipine (NORVASC) 5 MG tablet Take 0.5 tablets (2.5 mg total) by mouth daily. 15 tablet 2  . aspirin EC 81 MG tablet Take 81 mg by mouth daily. Swallow whole.    Marland Kitchen atorvastatin (LIPITOR) 40 MG tablet Take 1 tablet (40 mg total) by mouth daily at 6 PM. 30 tablet 2  . ferrous sulfate 325 (65 FE) MG tablet Take 325 mg by mouth 2 (two) times daily. PCP advised him to take this for 3 months. (Started June 2021)    . rivaroxaban (XARELTO) 20 MG TABS tablet Take 1 tablet (20 mg total) by mouth daily with supper. Take with the biggest meal of the day at the same time every day. Do not miss a dose. Set your alarm to remind you to take your Xarelto. 30 tablet 1   No current facility-administered medications on file prior to visit.    Allergies:   Allergies  Allergen Reactions  . Contrast Media [Iodinated Diagnostic Agents] Other (See Comments)     Affects Kidney Function     Physical Exam General: Frail middle-aged Hispanic male, seated, in no evident distress Head: head normocephalic and atraumatic.  Neck: supple with no carotid or supraclavicular bruits Cardiovascular: regular rate and rhythm, no murmurs Musculoskeletal: no deformity Skin:  no rash/petichiae Vascular:  Normal pulses all extremities Vitals:   08/25/19 1311  BP: (!) 147/91  Pulse: 74   Neurologic Exam Mental Status: Awake and fully alert. Oriented to place and time. Recent and remote memory intact. Attention span, concentration and fund of knowledge appropriate. Mood and affect appropriate.  Cranial Nerves:  Fundoscopic exam reveals sharp disc margins. Pupils equal, briskly reactive to light. Extraocular movements full without nystagmus. Visual fields full to confrontation. Hearing intact. Facial sensation intact. Face, tongue, palate moves normally and symmetrically.  Motor: Normal bulk and tone. Normal strength in all tested extremity muscles. Sensory.: intact to touch ,pinprick .position and vibratory sensation.  Coordination: Rapid alternating movements normal in all extremities. Finger-to-nose and heel-to-shin performed accurately bilaterally. Gait and Station: Arises from chair without difficulty. Stance is normal. Gait demonstrates normal stride length and balance . Able to heel, toe and tandem walk without difficulty.  Reflexes: 1+ and symmetric. Toes downgoing.   NIHSS  0 Modified Rankin  0   ASSESSMENT: 64 year old Hispanic male with left MCA infarct in May 2021 due to left MCA occlusion s/p mechanical thrombectomy due to atrial fibrillation.  Also incidental 5 mm anterior communicating artery aneurysm s/p endovascular coiling treatment on 08/05/2019.     PLAN: I had a long discussion with the patient and his daughter regarding his recent embolic stroke and excellent clinical recovery following his successful mechanical thrombectomy.  We also  discussed about his asymptomatic anterior communicating artery and some which has been successfully endovascularly treated recently.  Continue aspirin and Xarelto for stroke prevention for now but consider switching to warfarin after speaking to his primary care physician as he is no longer able to afford Xarelto.  Maintain strict control of hypertension with   blood pressure goal below 140/90, lipids with LDL cholesterol goal below 70 mg percent and diabetes with hemoglobin A1c goal below 6.5%.  He was encouraged to eat a healthy diet with lots of fruits, vegetables, cereals, whole grains and to be active and exercise regularly.  Follow-up with Dr. Sherlon Handing for aneurysm treatment follow-up.  He will return for follow-up in the future in 6 months with my nurse practitioner Shanda Bumps or call earlier if necessary. Greater than 50% of time during this 30 minute visit was spent on counseling,explanation of diagnosis embolic stroke and brain aneurysm, planning of further management, discussion with patient and family and coordination of care Delia Heady, MD  [facility] [address] [place], [place] [address]  Phone [phone] Fax [phone]: This document was prepared with digital dictation and possible smart phrase technology. Any transcriptional errors that result from this process are unintentional

## 2019-08-25 NOTE — Patient Instructions (Signed)
I had a long discussion with the patient and his daughter regarding his recent embolic stroke and excellent clinical recovery following his successful mechanical thrombectomy.  We also discussed about his asymptomatic anterior communicating artery and some which has been successfully endovascularly treated recently.  Continue aspirin and Xarelto for stroke prevention for now but consider switching to warfarin after speaking to his primary care physician as he is no longer able to afford Xarelto.  Maintain strict control of hypertension with  t blood pressure goal below 140/90, lipids with LDL cholesterol goal below 70 mg percent and diabetes with hemoglobin A1c goal below 6.5%.  He was encouraged to eat a healthy diet with lots of fruits, vegetables, cereals, whole grains and to be active and exercise regularly.  He will return for follow-up in the future in 6 months with my nurse practitioner Shanda Bumps or call earlier if necessary.  Prevencin del ictus Stroke Prevention Algunas enfermedades o conductas se asocian a un aumento en el riesgo de sufrir un accidente cerebrovascular. Puede ayudar a prevenir un accidente cerebrovascular optando por una alimentacin y un estilo de vida ms saludables y haciendo otros cambios, incluyendo Chief Operating Officer cualquier afeccin mdica que tenga.  Qu cambios nutricionales se pueden realizar?   Consuma alimentos saludables. Puede hacer lo siguiente: ? Elija alimentos ricos en fibra, como frutas y verduras frescas, y cereales integrales. ? Coma 5 o ms porciones de frutas y verduras al da. Trate de que la mitad del plato de cada comida sea de frutas y verduras. ? Elija alimentos con protenas magras, como cortes de carne magros, carne de ave sin piel, pescado, tofu, frijoles y nueces. ? Coma productos lcteos descremados. ? Evite los alimentos con alto contenido de sal (sodio). Esto puede ayudar a Personal assistant presin arterial. ? Evite los alimentos que contienen grasas  saturadas, grasas trans y colesterol. Esto puede ayudar a Architectural technologist. ? Evite los alimentos procesados ??y precocinados.  Siga las pautas especficas de su mdico para perder peso, Chief Operating Officer la presin arterial alta (hipertensin), reducir el colesterol alto y controlar la diabetes. Estos pueden incluir lo siguiente: ? Reducir su ingesta calrica diaria. ? Limitar su consumo diario de sodio a 1500 miligramos (mg). ? Usar solo aceites saludables para cocinar, como el aceite de [place], [place] o [place]. ? Contar su consumo diario de carbohidratos. Qu cambios en el estilo de vida se pueden realizar?  Mantenga un peso saludable. Hable con el mdico sobre su peso ideal.  Sparta por lo menos de actividad fsica moderada 5 das por [place]. Actividad fsica moderada incluye caminar rpido, andar en bicicleta y nadar.  No consuma ningn producto que contenga nicotina o tabaco, como cigarrillos y Administrator, Civil Service. Si necesita ayuda para dejar de fumar, consulte al American Express. Tambin puede ser til evitar la exposicin al humo de [place].  Limite el consumo de alcohol a no ms de por da si es mujer y no est [place], y por da si es hombre. Una medida equivale a 12oz ( ) de cerveza, 5oz ( ) de vino o 1oz (79ml) de bebidas alcohlicas de alta graduacin.  Detenga el consumo de drogas ilegales.  Evite tomar pldoras anticonceptivas. Hable con su mdico [employer] de tomar pldoras anticonceptivas si: ? Es mayor de 35aos. ? Fuma. ? Tiene migraas. ? Alguna vez ha tenido un cogulo de [place]. Qu otros cambios se pueden realizar?  Controle los niveles de [place]. ? Llevar una dieta saludable es importante para prevenir el  colesterol alto. Si el colesterol no puede controlarse nicamente con la dieta, es posible que tambin deba tomar medicamentos. ? [employer] para Public house manager como se lo haya  indicado su mdico.  Controle su diabetes. ? Llevar una dieta saludable y hacer ejercicio regularmente son partes importantes del control de su nivel de Banker. Si su nivel de azcar en la sangre no puede controlarse mediante dieta y ejercicio, es posible que deba tomar medicamentos. ? [employer] para controlar la diabetes como se lo haya indicado su mdico.  Controle su hipertensin. ? Para reducir su riesgo de accidente cerebrovascular, trate de mantener su presin arterial por debajo de 130/80. ? Llevar una dieta saludable y hacer ejercicio regularmente son partes importantes del control de su presin arterial. Si su presin arterial no puede controlarse mediante dieta y ejercicio, es posible que deba tomar medicamentos. ? [employer] para controlar la hipertensin como se lo haya indicado su mdico. ? Pregntele a su mdico si debera medirse su presin arterial en el hogar. ? Controle su presin arterial todos los aos, incluso si su presin arterial es normal. La presin arterial aumenta con la edad y algunas afecciones mdicas.  Hgase una evaluacin de los trastornos del sueo (apnea del sueo). Hable con su mdico sobre cmo hacer una evaluacin del sueo si ronca mucho o tiene excesiva somnolencia.  Tome los medicamentos de venta libre y los recetados solamente como se lo haya indicado el mdico. Es posible que se le recomiende tomar aspirina o un diluyente de la sangre (antiplaquetario o Environmental education officer) para reducir Nurse, adult de formacin de cogulos de sangre que pueden provocar una accidente cerebrovascular.  Asegrese de tener bajo control cualquier otra afeccin mdica que tenga, como la fibrilacin auricular o la aterosclerosis. Cules son las seales de alerta de un accidente cerebrovascular? Los signos de advertencia de un accidente cerebrovascular se pueden recordar fcilmente como: "BEFAST".  B es por "balance" (equilibrio). Algunos signos  son los siguientes: ? Mareos. ? Prdida del equilibrio o de la coordinacin. ? Dificultad repentina para caminar.  E es por "eyes" (ojos). Algunos signos son los siguientes: ? Un cambio repentino en la visin. ? Dificultad para ver.  F es por "face" (rostro). Algunos signos son los siguientes: ? Debilidad o adormecimiento del rostro. ? Su rostro o prpado se caen hacia un lado.  A es por "arms" (brazo). Algunos signos son los siguientes: ? Debilidad o adormecimiento sbito del brazo, generalmente en un lado del cuerpo.  S es por "speech" (habla). Algunos signos son los siguientes: ? Dificultad para hablar (afasia). ? Dificultad para comprender.  T es por "time" Animal nutritionist). ? Estos sntomas pueden representar un problema grave que constituye Radio broadcast assistant. No espere hasta que los sntomas desaparezcan. Solicite atencin mdica de inmediato. Llame a su servicio de Marine scientist (911 en los [place]). No conduzca por sus propios medios [employer] [facility].  Otros signos de un accidente cerebrovascular pueden incluir: ? Dolor de cabeza sbito e intenso que no tiene causa aparente. ? Nuseas o vmitos. ? Convulsiones. Dnde encontrar ms informacin Para ms informacin, visite el siguiente enlace:  American Stroke Association (Asociacin Americana de Accidente Cerebrovascular): www.strokeassociation.org  National Stroke Association Engineering geologist de Accidente Cerebrovascular): www.stroke.org Resumen  Puede prevenir un accidente cerebrovascular optando por una alimentacin saludable, hacer ejercicio, no fumar, limitar el consumo de alcohol, y manteniendo bajo control cualquier afeccin mdica que tenga.  No consuma ningn producto que contenga nicotina  o tabaco, como cigarrillos y Administrator, Civil Service. Si necesita ayuda para dejar de fumar, consulte al American Express. Tambin puede ser til evitar la exposicin al humo de [place].  Recuerde las seales de alerta de un  accidente cerebrovascular "BEFAST". Obtenga ayuda de inmediato si usted o un ser querido presenta alguna de estas seales de Control and instrumentation engineer. Esta informacin no tiene Theme park manager el consejo del mdico. Asegrese de hacerle al mdico cualquier pregunta que tenga. Document Revised: 05/29/2016 Document Reviewed: 05/29/2016 Elsevier Patient Education  2020 [employer].

## 2019-09-25 ENCOUNTER — Other Ambulatory Visit: Payer: Self-pay

## 2019-09-25 NOTE — Patient Outreach (Signed)
[facility]) Care Management  09/25/2019  Capers Hagmann Jul 02, 1955 [id number]   First telephone outreach attempt to obtain mRS.  Left message for returned call.  [facility] Management Assistant [phone]

## 2019-09-29 ENCOUNTER — Other Ambulatory Visit: Payer: Self-pay

## 2019-09-29 NOTE — Patient Outreach (Signed)
[facility]) Care Management  09/29/2019  Blake Carey 1955-12-30 [id number]   Telephone outreach to patient to obtain mRS was successfully completed. MRS=0.  Domingo Cocking [facility] Care Management Assistant [phone]

## 2019-10-12 DIAGNOSIS — I4891 Unspecified atrial fibrillation: Secondary | ICD-10-CM | POA: Insufficient documentation

## 2019-10-12 DIAGNOSIS — I639 Cerebral infarction, unspecified: Secondary | ICD-10-CM | POA: Insufficient documentation

## 2019-10-12 DIAGNOSIS — I671 Cerebral aneurysm, nonruptured: Secondary | ICD-10-CM | POA: Insufficient documentation

## 2019-10-12 DIAGNOSIS — D649 Anemia, unspecified: Secondary | ICD-10-CM | POA: Insufficient documentation

## 2019-10-12 DIAGNOSIS — I10 Essential (primary) hypertension: Secondary | ICD-10-CM | POA: Insufficient documentation

## 2019-10-13 ENCOUNTER — Other Ambulatory Visit: Payer: Self-pay

## 2019-10-13 ENCOUNTER — Encounter: Payer: Self-pay | Admitting: Cardiology

## 2019-10-13 ENCOUNTER — Ambulatory Visit (INDEPENDENT_AMBULATORY_CARE_PROVIDER_SITE_OTHER): Payer: Self-pay | Admitting: Cardiology

## 2019-10-13 VITALS — BP 140/80 | HR 68 | Ht 63.0 in | Wt 152.0 lb

## 2019-10-13 DIAGNOSIS — E669 Obesity, unspecified: Secondary | ICD-10-CM

## 2019-10-13 DIAGNOSIS — I480 Paroxysmal atrial fibrillation: Secondary | ICD-10-CM

## 2019-10-13 DIAGNOSIS — Z8673 Personal history of transient ischemic attack (TIA), and cerebral infarction without residual deficits: Secondary | ICD-10-CM

## 2019-10-13 DIAGNOSIS — I671 Cerebral aneurysm, nonruptured: Secondary | ICD-10-CM

## 2019-10-13 DIAGNOSIS — I10 Essential (primary) hypertension: Secondary | ICD-10-CM

## 2019-10-13 NOTE — Progress Notes (Signed)
Cardiology Office Note:    Date:  10/13/2019   ID:  Jesaiah Fabiano, DOB July 30, 1955, MRN [id number]  PCP:  Dema Severin, NP  Cardiologist:  Thomasene Ripple, DO  Electrophysiologist:  None   Referring MD: Dema Severin, NP   " I am doing well"  History of Present Illness:    Nohlan Burdin is a 64 y.o. male with a hx of atrial fibrillation previously on eliquis and had a recent left MCA embolic stroke status post TICI3 reperfusion (IR mechanical thrombectomy), therefore he was switched to Xarelto, saccular aneurysm of the ACOM status post coiling, Hypertension, Hyperlipidemia.  I did see the patient on 07/10/2019 and at that time he was status post hospitalization for his CVA. At that time he was in good spirits and was planning his coiling for saccular aneurysm repair.  Since his last visit he has had his coiling and is doing well. He offers no complaints today. He is happy with is progress. His visit today was facilitated by the cone interpreter Alferd Patee).    Past Medical History:  Diagnosis Date  . Acute lower UTI 06/24/2019  . AKI (acute kidney injury) (HCC)    05/2019 (contrast nephropathy versus obstructive urinary retention with UTI)  . Anemia    low iron  . Aneurysm of anterior communicating artery 06/24/2019  . Atrial fibrillation (HCC)   . Cerebral aneurysm    ACOM aneurysm 06/21/19  . Dyslipidemia 12/03/2018  . Essential hypertension 12/03/2018  . History of CVA (cerebrovascular accident) 07/10/2019  . HTN (hypertension)   . Leukocytosis 06/24/2019  . Mixed hyperlipidemia 07/10/2019  . Obesity (BMI 30-39.9) 12/03/2018  . Paroxysmal atrial fibrillation (HCC) 12/03/2018  . Persistent atrial fibrillation (HCC) 07/10/2019  . Pre-procedure lab exam 12/31/2018  . Stroke (cerebrum) (HCC) 06/20/2019  . Stroke [facility])    06/20/19    Past Surgical History:  Procedure Laterality Date  . APPENDECTOMY    . IR 3D INDEPENDENT WKST  08/05/2019  . IR ANGIOGRAM FOLLOW UP STUDY  08/05/2019  . IR  CT HEAD LTD  06/20/2019  . IR PERCUTANEOUS ART THROMBECTOMY/INFUSION INTRACRANIAL INC DIAG ANGIO  06/20/2019  . IR TRANSCATH/EMBOLIZ  08/05/2019      . IR TRANSCATH/EMBOLIZ  08/05/2019  . IR US GUIDE VASC ACCESS RIGHT  06/20/2019  . IR US GUIDE VASC ACCESS RIGHT  08/05/2019  . KNEE SURGERY Right   . RADIOLOGY WITH ANESTHESIA N/A 06/20/2019   Procedure: IR WITH ANESTHESIA;  Surgeon: Julieanne Cotton, MD;  Location: [facility] OR;  Service: Radiology;  Laterality: N/A;  . RADIOLOGY WITH ANESTHESIA N/A 08/05/2019   Procedure: ANEURYSM EMBOLIZATION;  Surgeon: Baldemar Lenis, MD;  [facility];  Service: Radiology;  Laterality: N/A;    Current Medications: Current Meds  Medication Sig  . amLODipine (NORVASC) 5 MG tablet Take 0.5 tablets (2.5 mg total) by mouth daily. (Patient taking differently: Take 5 mg by mouth daily. )  . aspirin EC 81 MG tablet Take 81 mg by mouth daily. Swallow whole.  Marland Kitchen atorvastatin (LIPITOR) 40 MG tablet Take 1 tablet (40 mg total) by mouth daily at 6 PM.  . rivaroxaban (XARELTO) 20 MG TABS tablet Take 1 tablet (20 mg total) by mouth daily with supper. Take with the biggest meal of the day at the same time every day. Do not miss a dose. Set your alarm to remind you to take your Xarelto.     Allergies:   Contrast media [iodinated diagnostic agents]   Social  History   Socioeconomic History  . Marital status: Unknown    Spouse name: Not on file  . Number of children: Not on file  . Years of education: Not on file  . Highest education level: Not on file  Occupational History  . Not on file  Tobacco Use  . Smoking status: Never Smoker  . Smokeless tobacco: Never Used  Vaping Use  . Vaping Use: Never used  Substance and Sexual Activity  . 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 Drug use: Never  . Sexual activity: Not on file  Other Topics Concern  . Not on file  Social History Narrative  . Not on file   Social Determinants of  Health   Financial Resource Strain:   . Difficulty of Paying Living Expenses: Not on file  Food Insecurity:   . Worried About Programme researcher, broadcasting/film/video in the Last Year: Not on file  . Ran Out of Food in the Last Year: Not on file  Transportation Needs:   . Lack of Transportation (Medical): Not on file  . Lack of Transportation (Non-Medical): Not on file  Physical Activity:   . Days of Exercise per Week: Not on file  . Minutes of Exercise per Session: Not on file  Stress:   . Feeling of Stress : Not on file  Social Connections:   . Frequency of Communication with Friends and Family: Not on file  . Frequency of Social Gatherings with Friends and Family: Not on file  . Attends Religious Services: Not on file  . Active Member of Clubs or Organizations: Not on file  . Attends Banker Meetings: Not on file  . Marital Status: Not on file     Family History: The patient's family history includes Prostate cancer in his father.  ROS:   Review of Systems  Constitution: Negative for decreased appetite, fever and weight gain.  HENT: Negative for congestion, ear discharge, hoarse voice and sore throat.   Eyes: Negative for discharge, redness, vision loss in right eye and visual halos.  Cardiovascular: Negative for chest pain, dyspnea on exertion, leg swelling, orthopnea and palpitations.  Respiratory: Negative for cough, hemoptysis, shortness of breath and snoring.   Endocrine: Negative for heat intolerance and polyphagia.  Hematologic/Lymphatic: Negative for bleeding problem. Does not bruise/bleed easily.  Skin: Negative for flushing, nail changes, rash and suspicious lesions.  Musculoskeletal: Negative for arthritis, joint pain, muscle cramps, myalgias, neck pain and stiffness.  Gastrointestinal: Negative for abdominal pain, bowel incontinence, diarrhea and excessive appetite.  Genitourinary: Negative for decreased libido, genital sores and incomplete emptying.  Neurological:  Negative for brief paralysis, focal weakness, headaches and loss of balance.  Psychiatric/Behavioral: Negative for altered mental status, depression and suicidal ideas.  Allergic/Immunologic: Negative for HIV exposure and persistent infections.    EKGs/Labs/Other Studies Reviewed:    The following studies were reviewed today:   EKG:  The ekg ordered today demonstrates   Recent Labs: 06/24/2019: ALT 31 08/06/2019: BUN 10; Creatinine, Ser 0.89; Hemoglobin 11.0; Platelets 201; Potassium 3.4; Sodium 140  Recent Lipid Panel    Component Value Date/Time   CHOL 212 (H) 06/21/2019 0236   TRIG 94 06/21/2019 0236   HDL 50 06/21/2019 0236   CHOLHDL 4.2 06/21/2019 0236   VLDL 19 06/21/2019 0236   LDLCALC 143 (H) 06/21/2019 0236    Physical Exam:    VS:  BP 140/80   Pulse 68   Ht  5\' 3"  (1.6 m)   Wt 152 lb (68.9 kg)   SpO2 100%   BMI 26.93 kg/m     Wt Readings from Last 3 Encounters:  10/13/19 152 lb (68.9 kg)  08/25/19 156 lb (70.8 kg)  08/05/19 155 lb (70.3 kg)     GEN: Well nourished, well developed in no acute distress HEENT: Normal NECK: No JVD; No carotid bruits LYMPHATICS: No lymphadenopathy CARDIAC: S1S2 noted,RRR, no murmurs, rubs, gallops RESPIRATORY:  Clear to auscultation without rales, wheezing or rhonchi  ABDOMEN: Soft, non-tender, non-distended, +bowel sounds, no guarding. EXTREMITIES: No edema, No cyanosis, no clubbing MUSCULOSKELETAL:  No deformity  SKIN: Warm and dry NEUROLOGIC:  Alert and oriented x 3, non-focal PSYCHIATRIC:  Normal affect, good insight  ASSESSMENT:    1. Essential hypertension   2. Paroxysmal atrial fibrillation (HCC)   3. Brain aneurysm   4. Obesity (BMI 30-39.9)   5. History of CVA (cerebrovascular accident)    PLAN:     He appears to be doing well from a cardiovascular standpoint. He denies chest pain, shortness of breath. He is back to his baseline. He completed physical therapy.   He will continue on his current medication  regimen. He share his concern for cost of his medication pricing for the xeralto. Unfortunately today I did not have any samples  But we did give the patient the form to complete for medication assistance. He states the he has a family member at home that will help to complete the form.   His target is 130/80 mmhg- today he is 140/82 mmhg manually by me.  He hsa not quite been cutting his salt, I have asked him to cut back on his salt hopefully this will help. If not I will titrate up his antihypertensive.  The patient is in agreement with the above plan. The patient left the office in stable condition.  The patient will follow up in 3 months.    Medication Adjustments/Labs and Tests Ordered: Current medicines are reviewed at length with the patient today.  Concerns regarding medicines are outlined above.  No orders of the defined types were placed in this encounter.  No orders of the defined types were placed in this encounter.   Patient Instructions  Medication Instructions:  Your physician recommends that you continue on your current medications as directed. Please refer to the Current Medication list given to you today.  *If you need a refill on your cardiac medications before your next appointment, please call your pharmacy*   Lab Work: None. If you have labs (blood work) drawn today and your tests are completely normal, you will receive your results only by: 10/06/19 MyChart Message (if you have MyChart) OR . A paper copy in the mail If you have any lab test that is abnormal or we need to change your treatment, we will call you to review the results.   Testing/Procedures: None.   Follow-Up: At [facility], you and your health needs are our priority.  As part of our continuing mission to provide you with exceptional heart care, we have created designated Provider Care Teams.  These Care Teams include your primary Cardiologist (physician) and Advanced Practice Providers (APPs -   Physician Assistants and Nurse Practitioners) who all work together to provide you with the care you need, when you need it.  We recommend signing up for the patient portal called "MyChart".  Sign up information is provided on this After Visit Summary.  MyChart is used to  connect with patients for Virtual Visits (Telemedicine).  Patients are able to view lab/test results, encounter notes, upcoming appointments, etc.  Non-urgent messages can be sent to your provider as well.   To learn more about what you can do with MyChart, go to [email or web address].    Your next appointment:   3 month(s)  The format for your next appointment:   In Person  Provider:   Thomasene RippleKardie Avaley Coop, DO   Other Instructions      Adopting a Healthy Lifestyle.  Know what a healthy weight is for you (roughly BMI <25) and aim to maintain this   Aim for 7+ servings of fruits and vegetables daily   65-80+ fluid ounces of water or unsweet tea for healthy kidneys   Limit to max 1 drink of alcohol per day; avoid smoking/tobacco   Limit animal fats in diet for cholesterol and heart health - choose grass fed whenever available   Avoid highly processed foods, and foods high in saturated/trans fats   Aim for low stress - take time to unwind and care for your mental health   Aim for 150 min of moderate intensity exercise weekly for heart health, and weights twice weekly for bone health   Aim for 7-9 hours of sleep daily   When it comes to diets, agreement about the perfect plan isnt easy to find, even among the experts. Experts at the [employer] developed an idea known as the Healthy Eating Plate. Just imagine a plate divided into logical, healthy portions.   The emphasis is on diet quality:   Load up on vegetables and fruits - one-half of your plate: Aim for color and variety, and remember that potatoes dont count.   Go for whole grains - one-quarter of your plate: Whole wheat, barley, wheat  berries, quinoa, oats, brown rice, and foods made with them. If you want pasta, go with whole wheat pasta.   Protein power - one-quarter of your plate: Fish, chicken, beans, and nuts are all healthy, versatile protein sources. Limit red meat.   The diet, however, does go beyond the plate, offering a few other suggestions.   Use healthy plant oils, such as olive, canola, soy, corn, sunflower and peanut. Check the labels, and avoid partially hydrogenated oil, which have unhealthy trans fats.   If youre thirsty, drink water. Coffee and tea are good in moderation, but skip sugary drinks and limit milk and dairy products to one or two daily servings.   The type of carbohydrate in the diet is more important than the amount. Some sources of carbohydrates, such as vegetables, fruits, whole grains, and beans-are healthier than others.   Finally, stay active  Signed, Thomasene RippleKardie Lavan Imes, DO  10/13/2019 8:13 PM    [facility]

## 2019-10-13 NOTE — Patient Instructions (Signed)

## 2019-10-16 ENCOUNTER — Other Ambulatory Visit: Payer: Self-pay

## 2019-10-16 ENCOUNTER — Telehealth: Payer: Self-pay | Admitting: Cardiology

## 2019-10-16 NOTE — Telephone Encounter (Signed)
Spoke with daughter and informed her that we were out of samples. I placed a free 30 day supply card at the front for pt to use if he qualifies.

## 2019-10-16 NOTE — Telephone Encounter (Signed)
Called daughter back but did not get an answer. I did leave a voicemail letting her know that the forms were faxed on 10/14/19 but we have not heard back from the patient assistance foundation at this time. I also gave her our call back number in case she had any questions or concerns.

## 2019-10-16 NOTE — Telephone Encounter (Signed)
Pt's daughter called asked if there was any Xarelto samples her dad could have sense it will be awhile for the patient assistance comes in. Please call

## 2019-10-16 NOTE — Telephone Encounter (Signed)
New message:    Patient daughter calling to see the status of the forums she dropped off. Please call patient back.

## 2020-01-13 ENCOUNTER — Other Ambulatory Visit: Payer: Self-pay

## 2020-01-13 ENCOUNTER — Ambulatory Visit (INDEPENDENT_AMBULATORY_CARE_PROVIDER_SITE_OTHER): Payer: Self-pay | Admitting: Cardiology

## 2020-01-13 ENCOUNTER — Encounter: Payer: Self-pay | Admitting: Cardiology

## 2020-01-13 VITALS — BP 132/70 | HR 79 | Ht 63.0 in | Wt 154.6 lb

## 2020-01-13 DIAGNOSIS — I63132 Cerebral infarction due to embolism of left carotid artery: Secondary | ICD-10-CM

## 2020-01-13 DIAGNOSIS — I480 Paroxysmal atrial fibrillation: Secondary | ICD-10-CM

## 2020-01-13 DIAGNOSIS — I671 Cerebral aneurysm, nonruptured: Secondary | ICD-10-CM

## 2020-01-13 DIAGNOSIS — E782 Mixed hyperlipidemia: Secondary | ICD-10-CM

## 2020-01-13 DIAGNOSIS — Z8673 Personal history of transient ischemic attack (TIA), and cerebral infarction without residual deficits: Secondary | ICD-10-CM

## 2020-01-13 DIAGNOSIS — I10 Essential (primary) hypertension: Secondary | ICD-10-CM

## 2020-01-13 NOTE — Progress Notes (Signed)
Cardiology Office Note:    Date:  01/13/2020   ID:  Blake Carey, DOB 02/19/1955, MRN [id number]  PCP:  Dema Severin, NP  Cardiologist:  Thomasene Ripple, DO  Electrophysiologist:  None   Referring MD: Dema Severin, NP   " I am doing well"   History of Present Illness:    Blake Carey is a 64 y.o. male with a hx of of atrial fibrillation previously on eliquis and had a recent left MCAembolic stroke status post TICI3 reperfusion (IR mechanical thrombectomy), therefore he was switched to Xarelto, saccular aneurysm of the ACOM status post coiling, Hypertension, Hyperlipidemia.  I did see the patient on 07/10/2019 and at that time he was status post hospitalization for his CVA.  Atthattime he appeared to be doing well from a cardiovascular standpoint.  We had given you information for medical assistance for his Xarelto.  His blood pressure slightly high we discussed cutting back salt he was in good spirits and was planning his coiling for saccular aneurysm repair.  I saw the patient in September 2021, at that time he appeared to be doing well but his blood pressure slightly elevated.  We discussed cutting back salt.  He is here today for follow-up visit. He offers no complaints today. He is happy with is progress. His visit today was facilitated by the cone interpreter Alferd Patee).    Past Medical History:  Diagnosis Date   Acute lower UTI 06/24/2019   AKI (acute kidney injury) (HCC)    05/2019 (contrast nephropathy versus obstructive urinary retention with UTI)   Anemia    low iron   Aneurysm of anterior communicating artery 06/24/2019   Atrial fibrillation (HCC)    Cerebral aneurysm    ACOM aneurysm 06/21/19   Dyslipidemia 12/03/2018   Essential hypertension 12/03/2018   History of CVA (cerebrovascular accident) 07/10/2019   HTN (hypertension)    Leukocytosis 06/24/2019   Mixed hyperlipidemia 07/10/2019   Obesity (BMI 30-39.9) 12/03/2018   Paroxysmal atrial  fibrillation (HCC) 12/03/2018   Persistent atrial fibrillation (HCC) 07/10/2019   Pre-procedure lab exam 12/31/2018   Stroke (cerebrum) (HCC) 06/20/2019   Stroke (HCC)    06/20/19    Past Surgical History:  Procedure Laterality Date   APPENDECTOMY     IR 3D INDEPENDENT WKST  08/05/2019   IR ANGIOGRAM FOLLOW UP STUDY  08/05/2019   IR CT HEAD LTD  06/20/2019   IR PERCUTANEOUS ART THROMBECTOMY/INFUSION INTRACRANIAL INC DIAG ANGIO  06/20/2019   IR TRANSCATH/EMBOLIZ  08/05/2019       IR TRANSCATH/EMBOLIZ  08/05/2019   IR US GUIDE VASC ACCESS RIGHT  06/20/2019   IR US GUIDE VASC ACCESS RIGHT  08/05/2019   KNEE SURGERY Right    RADIOLOGY WITH ANESTHESIA N/A 06/20/2019   Procedure: IR WITH ANESTHESIA;  Surgeon: Julieanne Cotton, MD;  Location: [facility] OR;  Service: Radiology;  Laterality: N/A;   RADIOLOGY WITH ANESTHESIA N/A 08/05/2019   Procedure: ANEURYSM EMBOLIZATION;  Surgeon: Baldemar Lenis, MD;  Location: [facility] OR;  Service: Radiology;  Laterality: N/A;    Current Medications: Current Meds  Medication Sig   amLODipine (NORVASC) 5 MG tablet Take 0.5 tablets (2.5 mg total) by mouth daily. (Patient taking differently: Take 10 mg by mouth daily.)   atorvastatin (LIPITOR) 40 MG tablet Take 1 tablet (40 mg total) by mouth daily at 6 PM.   rivaroxaban (XARELTO) 20 MG TABS tablet Take 1 tablet (20 mg total) by mouth daily with supper. Take with the biggest  meal of the day at the same time every day. Do not miss a dose. Set your alarm to remind you to take your Xarelto.   [DISCONTINUED] aspirin EC 81 MG tablet Take 81 mg by mouth daily. Swallow whole.     Allergies:   Contrast media [iodinated diagnostic agents]   Social History   Socioeconomic History   Marital status: Unknown    Spouse name: Not on file   Number of children: Not on file   Years of education: Not on file   Highest education level: Not on file  Occupational History   Not on file  Tobacco Use    Smoking status: Never Smoker   Smokeless tobacco: Never Used  Vaping Use   Vaping Use: Never used  Substance and Sexual Activity   Alcohol use: Not Currently    Comment: sober for over 40 years   Drug use: Never   Sexual activity: Not on file  Other Topics Concern   Not on file  Social History Narrative   Not on file   Social Determinants of Health   Financial Resource Strain: Not on file  Food Insecurity: Not on file  Transportation Needs: Not on file  Physical Activity: Not on file  Stress: Not on file  Social Connections: Not on file     Family History: The patient's family history includes Prostate cancer in his father.  ROS:   Review of Systems  Constitution: Negative for decreased appetite, fever and weight gain.  HENT: Negative for congestion, ear discharge, hoarse voice and sore throat.   Eyes: Negative for discharge, redness, vision loss in right eye and visual halos.  Cardiovascular: Negative for chest pain, dyspnea on exertion, leg swelling, orthopnea and palpitations.  Respiratory: Negative for cough, hemoptysis, shortness of breath and snoring.   Endocrine: Negative for heat intolerance and polyphagia.  Hematologic/Lymphatic: Negative for bleeding problem. Does not bruise/bleed easily.  Skin: Negative for flushing, nail changes, rash and suspicious lesions.  Musculoskeletal: Negative for arthritis, joint pain, muscle cramps, myalgias, neck pain and stiffness.  Gastrointestinal: Negative for abdominal pain, bowel incontinence, diarrhea and excessive appetite.  Genitourinary: Negative for decreased libido, genital sores and incomplete emptying.  Neurological: Negative for brief paralysis, focal weakness, headaches and loss of balance.  Psychiatric/Behavioral: Negative for altered mental status, depression and suicidal ideas.  Allergic/Immunologic: Negative for HIV exposure and persistent infections.    EKGs/Labs/Other Studies Reviewed:    The following  studies were reviewed today:   EKG: None today  Recent Labs: 06/24/2019: ALT 31 08/06/2019: BUN 10; Creatinine, Ser 0.89; Hemoglobin 11.0; Platelets 201; Potassium 3.4; Sodium 140  Recent Lipid Panel    Component Value Date/Time   CHOL 212 (H) 06/21/2019 0236   TRIG 94 06/21/2019 0236   HDL 50 06/21/2019 0236   CHOLHDL 4.2 06/21/2019 0236   VLDL 19 06/21/2019 0236   LDLCALC 143 (H) 06/21/2019 0236    Physical Exam:    VS:  BP 132/70 (BP Location: Right Arm, Patient Position: Sitting, Cuff Size: Normal)    Pulse 79    Ht 5\' 3"  (1.6 m)    Wt 154 lb 9.6 oz (70.1 kg)    SpO2 100%    BMI 27.39 kg/m     Wt Readings from Last 3 Encounters:  01/13/20 154 lb 9.6 oz (70.1 kg)  10/13/19 152 lb (68.9 kg)  08/25/19 156 lb (70.8 kg)     GEN: Well nourished, well developed in no acute distress HEENT: Normal NECK:  No JVD; No carotid bruits LYMPHATICS: No lymphadenopathy CARDIAC: S1S2 noted,RRR, no murmurs, rubs, gallops RESPIRATORY:  Clear to auscultation without rales, wheezing or rhonchi  ABDOMEN: Soft, non-tender, non-distended, +bowel sounds, no guarding. EXTREMITIES: No edema, No cyanosis, no clubbing MUSCULOSKELETAL:  No deformity  SKIN: Warm and dry NEUROLOGIC:  Alert and oriented x 3, non-focal PSYCHIATRIC:  Normal affect, good insight  ASSESSMENT:    1. Cerebrovascular accident (CVA) due to embolism of left carotid artery (HCC)   2. Essential hypertension   3. Paroxysmal atrial fibrillation (HCC)   4. Brain aneurysm   5. History of CVA (cerebrovascular accident)   6. Mixed hyperlipidemia    PLAN:     He is doing well from a cardiovascular standpoint.  He is requesting that he is on too many medications I spoke with the patient about the importance of all of the medications that he is taking.  He had not been taking aspirin so I told that that was fine he can stay off the aspirin given the fact that his stroke was primarily highly suspected to be due to his atrial  fibrillation.  He will remain on the Xarelto.  We have given the patient samples.  His blood pressures acceptable no changes will be made to his antihypertensive regimen. He will continue his atorvastatin for his hyperlipidemia.  Blood work will be done today for BMP, mag, CBC.  The patient is in agreement with the above plan. The patient left the office in stable condition.  The patient will follow up in   Medication Adjustments/Labs and Tests Ordered: Current medicines are reviewed at length with the patient today.  Concerns regarding medicines are outlined above.  Orders Placed This Encounter  Procedures   Basic metabolic panel   CBC   Magnesium   No orders of the defined types were placed in this encounter.   Patient Instructions  Medication Instructions:  Your physician has recommended you make the following change in your medication:  STOP: Aspirin *If you need a refill on your cardiac medications before your next appointment, please call your pharmacy*   Lab Work: Your physician recommends that you return for lab work in: TODAY BMP, Mag, CBC If you have labs (blood work) drawn today and your tests are completely normal, you will receive your results only by:  MyChart Message (if you have MyChart) OR  A paper copy in the mail If you have any lab test that is abnormal or we need to change your treatment, we will call you to review the results.   Testing/Procedures: None   Follow-Up: At [facility], you and your health needs are our priority.  As part of our continuing mission to provide you with exceptional heart care, we have created designated Provider Care Teams.  These Care Teams include your primary Cardiologist (physician) and Advanced Practice Providers (APPs -  Physician Assistants and Nurse Practitioners) who all work together to provide you with the care you need, when you need it.  We recommend signing up for the patient portal called "MyChart".   Sign up information is provided on this After Visit Summary.  MyChart is used to connect with patients for Virtual Visits (Telemedicine).  Patients are able to view lab/test results, encounter notes, upcoming appointments, etc.  Non-urgent messages can be sent to your provider as well.   To learn more about what you can do with MyChart, go to [email or web address].    Your next appointment:   6 month(s)  The  format for your next appointment:   In Person  Provider:   Thomasene RippleKardie Tennyson Kallen, DO   Other Instructions      Adopting a Healthy Lifestyle.  Know what a healthy weight is for you (roughly BMI <25) and aim to maintain this   Aim for 7+ servings of fruits and vegetables daily   65-80+ fluid ounces of water or unsweet tea for healthy kidneys   Limit to max 1 drink of alcohol per day; avoid smoking/tobacco   Limit animal fats in diet for cholesterol and heart health - choose grass fed whenever available   Avoid highly processed foods, and foods high in saturated/trans fats   Aim for low stress - take time to unwind and care for your mental health   Aim for 150 min of moderate intensity exercise weekly for heart health, and weights twice weekly for bone health   Aim for 7-9 hours of sleep daily   When it comes to diets, agreement about the perfect plan isnt easy to find, even among the experts. Experts at the [employer] [facility] [employer] [facility] [facility] [employer] developed an idea known as the Healthy Eating Plate. Just imagine a plate divided into logical, healthy portions.   The emphasis is on diet quality:   Load up on vegetables and fruits - one-half of your plate: Aim for color and variety, and remember that potatoes dont count.   Go for whole grains - one-quarter of your plate: Whole wheat, barley, wheat berries, quinoa, oats, brown rice, and foods made with them. If you want pasta, go with whole wheat pasta.   Protein power - one-quarter of your plate: Fish, chicken, beans, and nuts are all  healthy, versatile protein sources. Limit red meat.   The diet, however, does go beyond the plate, offering a few other suggestions.   Use healthy plant oils, such as olive, canola, soy, corn, sunflower and peanut. Check the labels, and avoid partially hydrogenated oil, which have unhealthy trans fats.   If youre thirsty, drink water. Coffee and tea are good in moderation, but skip sugary drinks and limit milk and dairy products to one or two daily servings.   The type of carbohydrate in the diet is more important than the amount. Some sources of carbohydrates, such as vegetables, fruits, whole grains, and beans-are healthier than others.   Finally, stay active  Signed, Thomasene RippleKardie Abrial Arrighi, DO  01/13/2020 2:27 PM    [facility]

## 2020-01-13 NOTE — Patient Instructions (Signed)
Medication Instructions:  Your physician has recommended you make the following change in your medication:  STOP: Aspirin *If you need a refill on your cardiac medications before your next appointment, please call your pharmacy*   Lab Work: Your physician recommends that you return for lab work in: TODAY BMP, Mag, CBC If you have labs (blood work) drawn today and your tests are completely normal, you will receive your results only by: Marland Kitchen MyChart Message (if you have MyChart) OR . A paper copy in the mail If you have any lab test that is abnormal or we need to change your treatment, we will call you to review the results.   Testing/Procedures: None   Follow-Up: At [facility], you and your health needs are our priority.  As part of our continuing mission to provide you with exceptional heart care, we have created designated Provider Care Teams.  These Care Teams include your primary Cardiologist (physician) and Advanced Practice Providers (APPs -  Physician Assistants and Nurse Practitioners) who all work together to provide you with the care you need, when you need it.  We recommend signing up for the patient portal called "MyChart".  Sign up information is provided on this After Visit Summary.  MyChart is used to connect with patients for Virtual Visits (Telemedicine).  Patients are able to view lab/test results, encounter notes, upcoming appointments, etc.  Non-urgent messages can be sent to your provider as well.   To learn more about what you can do with MyChart, go to [email or web address].    Your next appointment:   6 month(s)  The format for your next appointment:   In Person  Provider:   Thomasene Ripple, DO   Other Instructions

## 2020-01-14 LAB — CBC
Hematocrit: 39.7 % (ref 37.5–51.0)
Hemoglobin: 12.6 g/dL — ABNORMAL LOW (ref 13.0–17.7)
MCH: 26.1 pg — ABNORMAL LOW (ref 26.6–33.0)
MCHC: 31.7 g/dL (ref 31.5–35.7)
MCV: 82 fL (ref 79–97)
Platelets: 201 10*3/uL (ref 150–450)
RBC: 4.82 x10E6/uL (ref 4.14–5.80)
RDW: 13.9 % (ref 11.6–15.4)
WBC: 6 10*3/uL (ref 3.4–10.8)

## 2020-01-14 LAB — BASIC METABOLIC PANEL
BUN/Creatinine Ratio: 12 (ref 10–24)
BUN: 15 mg/dL (ref 8–27)
CO2: 28 mmol/L (ref 20–29)
Calcium: 9.2 mg/dL (ref 8.6–10.2)
Chloride: 105 mmol/L (ref 96–106)
Creatinine, Ser: 1.22 mg/dL (ref 0.76–1.27)
GFR calc Af Amer: 71 mL/min/{1.73_m2} (ref >59)
GFR calc non Af Amer: 61 mL/min/{1.73_m2} (ref >59)
Glucose: 91 mg/dL (ref 65–99)
Potassium: 4.2 mmol/L (ref 3.5–5.2)
Sodium: 143 mmol/L (ref 134–144)

## 2020-01-14 LAB — MAGNESIUM: Magnesium: 1.9 mg/dL (ref 1.6–2.3)

## 2020-01-15 ENCOUNTER — Telehealth: Payer: Self-pay

## 2020-01-15 NOTE — Telephone Encounter (Signed)
Spoke with patient regarding results and recommendation.  Patient verbalizes understanding and is agreeable to plan of care. Advised patient to call back with any issues or concerns.  

## 2020-01-15 NOTE — Telephone Encounter (Signed)
-----   Message from Kardie Tobb, DO sent at 01/15/2020 10:00 AM EST ----- Blood work has improved. 

## 2020-01-15 NOTE — Telephone Encounter (Signed)
-----   Message from Thomasene Ripple, DO sent at 01/15/2020 10:00 AM EST ----- Blood work has improved.

## 2020-01-15 NOTE — Telephone Encounter (Signed)
Left message on patients voicemail to please return our call.   

## 2020-02-25 ENCOUNTER — Ambulatory Visit: Payer: Self-pay | Admitting: Neurology

## 2020-03-08 NOTE — Telephone Encounter (Signed)
Called and spoke to patient daughter per dpr. Advised her we do have samples place some aside for patient at [facility] office. Also placed a patient assistance form in there. No further questions.

## 2020-06-16 ENCOUNTER — Ambulatory Visit (INDEPENDENT_AMBULATORY_CARE_PROVIDER_SITE_OTHER): Payer: Self-pay | Admitting: Neurology

## 2020-06-16 ENCOUNTER — Encounter: Payer: Self-pay | Admitting: Neurology

## 2020-06-16 VITALS — BP 149/84 | HR 79 | Ht 64.0 in | Wt 166.7 lb

## 2020-06-16 DIAGNOSIS — I671 Cerebral aneurysm, nonruptured: Secondary | ICD-10-CM

## 2020-06-16 DIAGNOSIS — I6990 Unspecified sequelae of unspecified cerebrovascular disease: Secondary | ICD-10-CM

## 2020-06-16 NOTE — Patient Instructions (Signed)
I had a long d/w patient using a Spanish language interpreter who is accompanying him about his recent stroke, brain aneurysms, risk for recurrent stroke/TIAs, personally independently reviewed imaging studies and stroke evaluation results and answered questions.Continue Xarelto 20 mg daily for secondary stroke prevention and maintain strict control of hypertension with blood pressure goal below 130/90, diabetes with hemoglobin A1c goal below 6.5% and lipids with LDL cholesterol goal below 70 mg/dL. I also advised the patient to eat a healthy diet with plenty of whole grains, cereals, fruits and vegetables, exercise regularly and maintain ideal body weight .I advised him to follow-up with Dr. Salvadore Dom for his aneurysm follow-up.  Followup in the future with me in 1 year or call earlier if necessary.

## 2020-06-16 NOTE — Progress Notes (Signed)
[facility] [address] [place]. [place] [address] [phone]       OFFICE FOLLOW-UP NOTE  Mr. Zaydenn Stanczyk Date of Birth:  [birth year] Medical Record Number:  [id number]   HPI: Mr. Careaga is a 65 year old Hispanic male seen today for initial office follow-up visit following hospital admission for stroke in May 2021.  Is accompanied by his daughter [name] who translates for him today with his permission.  I personally reviewed history of presenting illness, electronic medical records and imaging films in PACS. Lyndall Torresis an 65 y.o.malewith a history ofHTN anda-fib, on Eliquis, who presents via EMS as a Code Stroke after acute onset of right hemiplegia and aphasia while eating at a restaurant this evening.Time of onset is the same as LKW: 1845. He was witnessed by family to become non-verbal and not acting right, as well as unable to follow instructions.He has no prior history of stroke.He was not a tPA candidate as he was on eliquis. Found to have a L M1 LVO. Sent to IR for mechanical thrombectomy. With TICI 3 revascularization by Dr Loreta Ave.   Patient did very well and was kept in the ICU and extubated and had no physical deficits on exam.  CT angiogram is also shown a 5 mm anterior communicating artery aneurysm which was incidental.  MRI scan of the brain showed tiny scattered acute infarcts in the left MCA distribution mostly in the basal ganglia and punctate acute high right frontal incidental infarct.  MRA showed patent left M1 segment after treatment.  2D echo showed normal ejection fraction.  LDL cholesterol was elevated at 143 mg percent and hemoglobin A1c at 6.0.  Urine drug screen was negative.  Patient was on Eliquis prior to admission he was initially treated with IV heparin with plans to switch to warfarin however since patient was discharged over the weekend there was lack of ability to check his INR and to contact primary care physician to do this so  ends transition of care pharmacy arrange instead to give the patient 30-day free supply of Xarelto and patient was discharged on this with plans to switch to warfarin after being followed by primary care physician who would also follow INR.  Patient also returned subsequently and saw Dr. Whitney Post who acquired is asymptomatic a common rhythm on 08/05/2019 and procedure was uneventful.  He was started on aspirin 81 in addition to the Xarelto.  Patient is tolerating both medications well with minor bruising but no bleeding.  He is recovered back to his baseline and has no complaints today.  Blood pressures well controlled at home though today it is slightly elevated in the office at 147/91.  Is tolerating Lipitor well without muscle aches and pains.  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  He has not yet seen his primary care physician to set up switching Xarelto to warfarin and have regular INR checks.  He states he cannot afford the co-pay for his Xarelto Update 06/16/2020: Patient is seen today for follow-up after last visit nearly 10 months ago.  Is accompanied by Spanish language interpreter Rodena Medin was present throughout this visit.  Patient states he is doing well.  He said no recurrent stroke or TIA symptoms.  He has had no new neurological or other health problems.  He had trouble affording Eliquis since his cardiologist had switched him to Xarelto 20 mg daily which he cannot afford the co-pay.  Is tolerating it well with  only minor bruising and no bleeding.  Patient was advised at last visit to follow-up with neuro interventional radiologist to the follow-up on his cerebral aneurysms but unclear reason that has not happened.  He states his blood pressure is under good control on Norvasc and today it is borderline in the office at 149/84.  He remains on Lipitor 40 mg daily she is tolerating well without any side effects.  He has no complaints today ROS:   14 system review  of systems is positive for no complaints today  PMH:  Past Medical History:  Diagnosis Date  . Acute lower UTI 06/24/2019  . AKI (acute kidney injury) (HCC)    05/2019 (contrast nephropathy versus obstructive urinary retention with UTI)  . Anemia    low iron  . Aneurysm of anterior communicating artery 06/24/2019  . Atrial fibrillation (HCC)   . Cerebral aneurysm    ACOM aneurysm 06/21/19  . Dyslipidemia 12/03/2018  . Essential hypertension 12/03/2018  . History of CVA (cerebrovascular accident) 07/10/2019  . HTN (hypertension)   . Leukocytosis 06/24/2019  . Mixed hyperlipidemia 07/10/2019  . Obesity (BMI 30-39.9) 12/03/2018  . Paroxysmal atrial fibrillation (HCC) 12/03/2018  . Persistent atrial fibrillation (HCC) 07/10/2019  . Pre-procedure lab exam 12/31/2018  . Stroke (cerebrum) (HCC) 06/20/2019  . Stroke [facility])    06/20/19    Social History:  Social History   Socioeconomic History  . Marital status: Unknown    Spouse name: Not on file  . Number of children: Not on file  . Years of education: Not on file  . Highest education level: Not on file  Occupational History  . Occupation: part time  Tobacco Use  . Smoking status: Never Smoker  . Smokeless tobacco: Never Used  Vaping Use  . Vaping Use: Never used  Substance and Sexual Activity  . 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 Drug use: Never  . Sexual activity: Not on file  Other Topics Concern  . Not on file  Social History Narrative   Lives with wife   Right Handed    Drinks no caffeine   Social Determinants of Health   Financial Resource Strain: Not on file  Food Insecurity: Not on file  Transportation Needs: Not on file  Physical Activity: Not on file  Stress: Not on file  Social Connections: Not on file  Intimate Partner Violence: Not on file    Medications:   Current Outpatient Medications on File Prior to Visit  Medication Sig Dispense Refill  . atorvastatin (LIPITOR) 40 MG  tablet Take 1 tablet (40 mg total) by mouth daily at 6 PM. 30 tablet 2  . rivaroxaban (XARELTO) 20 MG TABS tablet Take 1 tablet (20 mg total) by mouth daily with supper. Take with the biggest meal of the day at the same time every day. Do not miss a dose. Set your alarm to remind you to take your Xarelto. 30 tablet 1  . amLODipine (NORVASC) 5 MG tablet Take 0.5 tablets (2.5 mg total) by mouth daily. (Patient taking differently: Take 10 mg by mouth daily.) 15 tablet 2   No current facility-administered medications on file prior to visit.    Allergies:   Allergies  Allergen Reactions  . Contrast Media [Iodinated Diagnostic Agents] Other (See Comments)    Affects Kidney Function     Physical Exam General: Frail middle-aged Hispanic male, seated, in no evident distress Head: head normocephalic and atraumatic.  Neck: supple with no carotid or supraclavicular bruits Cardiovascular: regular rate and rhythm, no murmurs Musculoskeletal: no deformity Skin:  no rash/petichiae Vascular:  Normal pulses all extremities Vitals:   06/16/20 1253  BP: (!) 149/84  Pulse: 79   Neurologic Exam Mental Status: Awake and fully alert. Oriented to place and time. Recent and remote memory intact. Attention span, concentration and fund of knowledge appropriate. Mood and affect appropriate.  Cranial Nerves: Fundoscopic exam not done pupils equal, briskly reactive to light. Extraocular movements full without nystagmus. Visual fields full to confrontation. Hearing intact. Facial sensation intact. Face, tongue, palate moves normally and symmetrically.  Motor: Normal bulk and tone. Normal strength in all tested extremity muscles. Sensory.: intact to touch ,pinprick .position and vibratory sensation.  Coordination: Rapid alternating movements normal in all extremities. Finger-to-nose and heel-to-shin performed accurately bilaterally. Gait and Station: Arises from chair without difficulty. Stance is normal. Gait  demonstrates normal stride length and balance . Able to heel, toe and tandem walk without difficulty.  Reflexes: 1+ and symmetric. Toes downgoing.      ASSESSMENT: 65 year old Hispanic male with left MCA infarct in May 2021 due to left MCA occlusion s/p mechanical thrombectomy due to atrial fibrillation.  Also incidental 5 mm anterior communicating artery aneurysm s/p endovascular coiling treatment on 08/05/2019.  He appears stable from neurovascular standpoint     PLAN: I had a long d/w patient using a Spanish language interpreter who is accompanying him about his recent stroke, brain aneurysms, risk for recurrent stroke/TIAs, personally independently reviewed imaging studies and stroke evaluation results and answered questions.Continue Xarelto 20 mg daily for secondary stroke prevention and maintain strict control of hypertension with blood pressure goal below 130/90, diabetes with hemoglobin A1c goal below 6.5% and lipids with LDL cholesterol goal below 70 mg/dL. I also advised the patient to eat a healthy diet with plenty of whole grains, cereals, fruits and vegetables, exercise regularly and maintain ideal body weight .I advised him to follow-up with Dr. Salvadore Dom for his aneurysm follow-up.  Followup in the future with me in 1 year or call earlier if necessary. Greater than 50% of time during this 25 minute visit was spent on counseling,explanation of diagnosis embolic stroke and brain aneurysm, planning of further management, discussion with patient and family and coordination of care Delia Heady, MD  [facility] [address] [place], [place] [address]  Phone [phone] Fax [phone]: This document was prepared with digital dictation and possible smart phrase technology. Any transcriptional errors that result from this process are unintentional

## 2020-06-22 ENCOUNTER — Telehealth: Payer: Self-pay

## 2020-06-22 NOTE — Telephone Encounter (Signed)
Faxed referral to Dr. Baldemar Lenis with Interventional Radiology. Their number is [phone]

## 2020-07-19 ENCOUNTER — Ambulatory Visit: Payer: Self-pay | Admitting: Cardiology

## 2020-07-22 ENCOUNTER — Encounter: Payer: Self-pay | Admitting: Cardiology

## 2020-10-14 ENCOUNTER — Other Ambulatory Visit (HOSPITAL_COMMUNITY): Payer: Self-pay | Admitting: Neuroradiology

## 2020-10-14 DIAGNOSIS — I671 Cerebral aneurysm, nonruptured: Secondary | ICD-10-CM

## 2020-10-26 ENCOUNTER — Other Ambulatory Visit: Payer: Self-pay

## 2020-10-26 ENCOUNTER — Ambulatory Visit (HOSPITAL_COMMUNITY)
Admission: RE | Admit: 2020-10-26 | Discharge: 2020-10-26 | Disposition: A | Discharge status: Home / Self Care | Payer: Medicare Other | Source: Ambulatory Visit | Attending: Neuroradiology | Admitting: Neuroradiology

## 2020-10-26 DIAGNOSIS — I671 Cerebral aneurysm, nonruptured: Secondary | ICD-10-CM | POA: Diagnosis present

## 2020-10-28 ENCOUNTER — Ambulatory Visit: Payer: Self-pay | Admitting: Cardiology

## 2020-11-07 IMAGING — XA IR TRANSCATH EMBOLIZATION
2 series · 9 of 9 positions shown · IV contrast (IODINE)
Comparison: Angiogram June 20, 2019.

INDICATION: 63-year-old male was admitted on 06/20/2019 with a left M1/MCA
occlusion. He underwent successful mechanical thrombectomy with
complete recanalization of the left MCA. He was found to have an
incidental 5 mm anterior communicating artery aneurysm. Plans for
coiling of this aneurysm during admission canceled due to UTI with
renal failure. He was transitioned from Xarelto to aspirin and
Brilinta in anticipation to endovascular intervention.

EXAM:
Diagnostic cerebral angiogram and endovascular aneurysm embolization
TECHNIQUE: LOW:
TECHNIQUE: LOW
Informed written consent was obtained from the patient after a
thorough discussion of the procedural risks, benefits and
alternatives. All questions were addressed. Maximal Sterile Barrier
Technique was utilized including caps, mask, sterile gowns, sterile
gloves, sterile drape, hand hygiene and skin antiseptic. A timeout
was performed prior to the initiation of the procedure.

[Series 1: (id) · 1 of 1 slices shown]
[im 1/1]
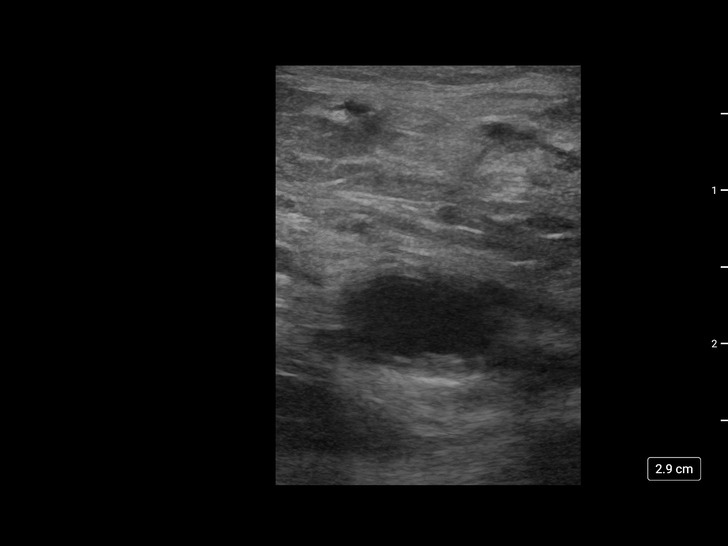

[Series 4: (id) head · 2 acquisitions, 8 frames shown]
[im 1/2]
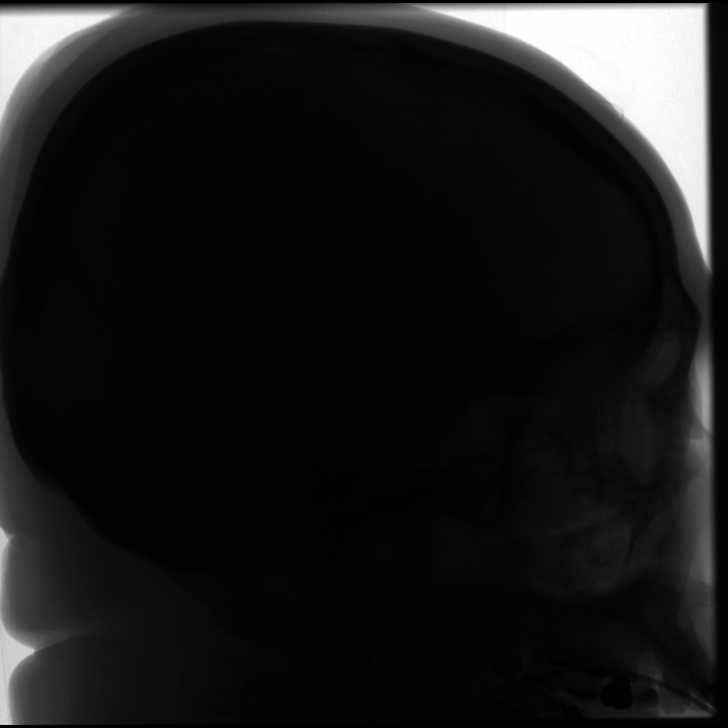
[im 1/2]
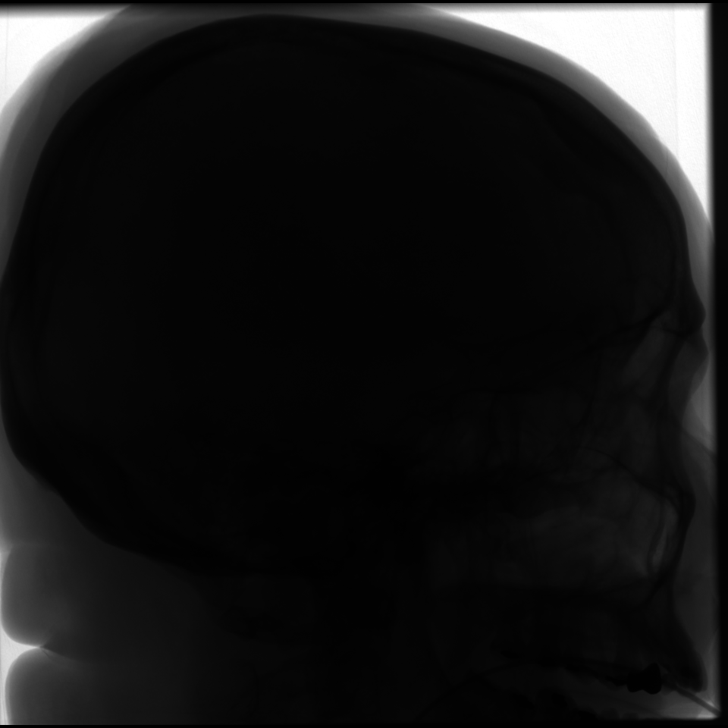
[im 1/2]
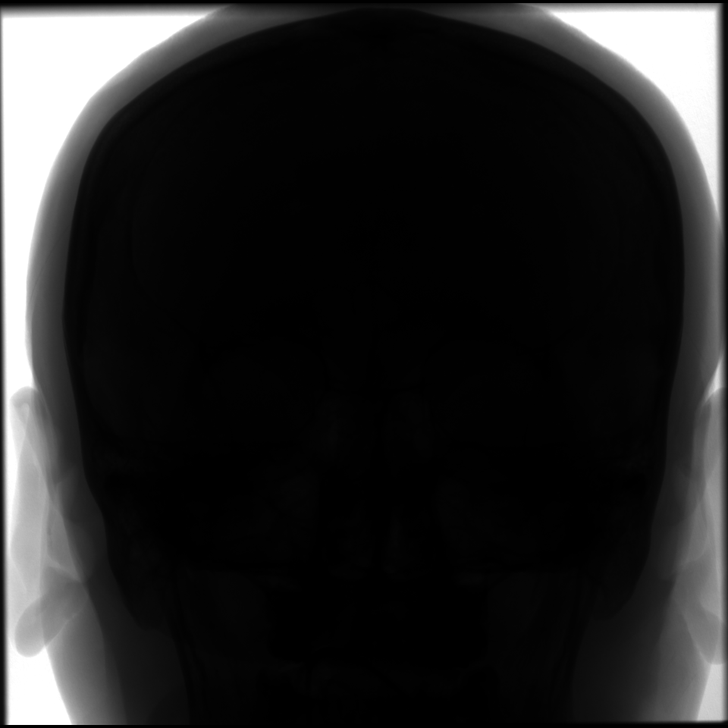
[im 1/2]
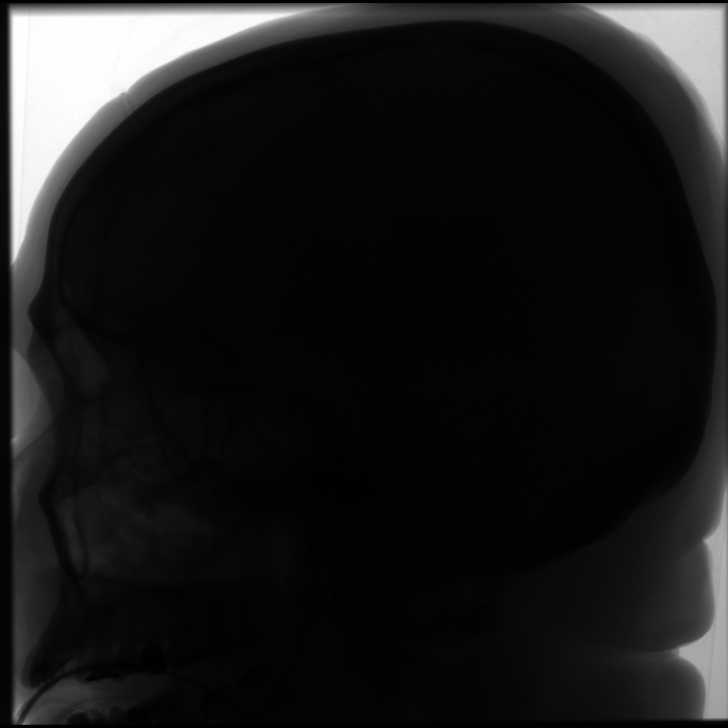
[im 2/2]
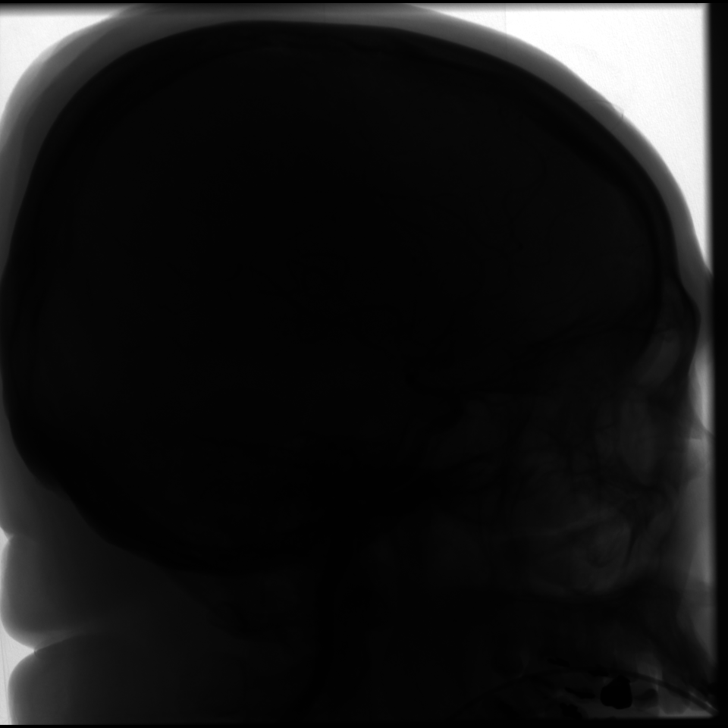
[im 2/2]
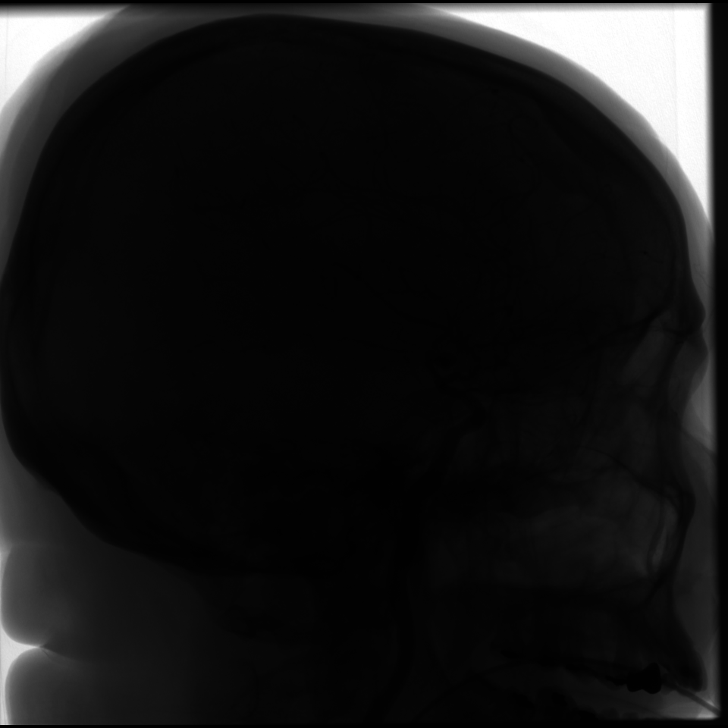
[im 2/2]
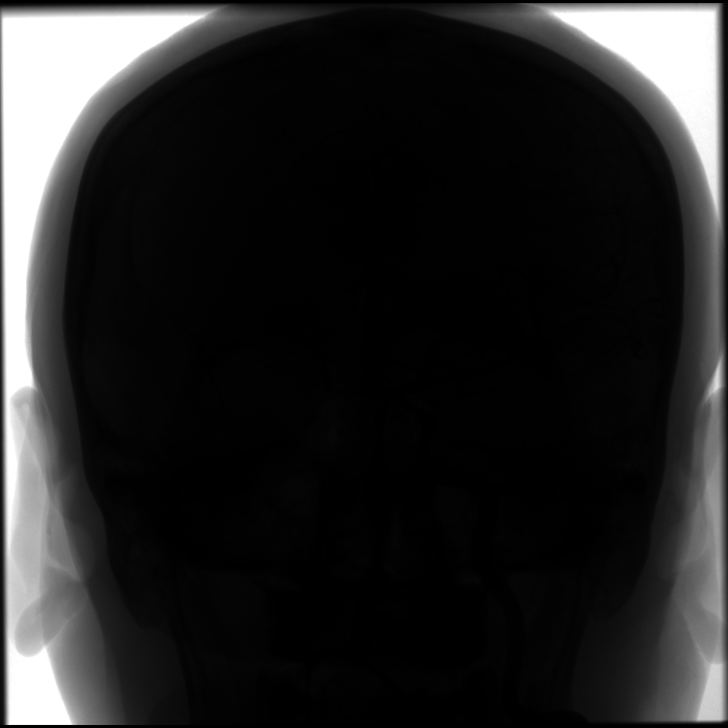
[im 2/2]
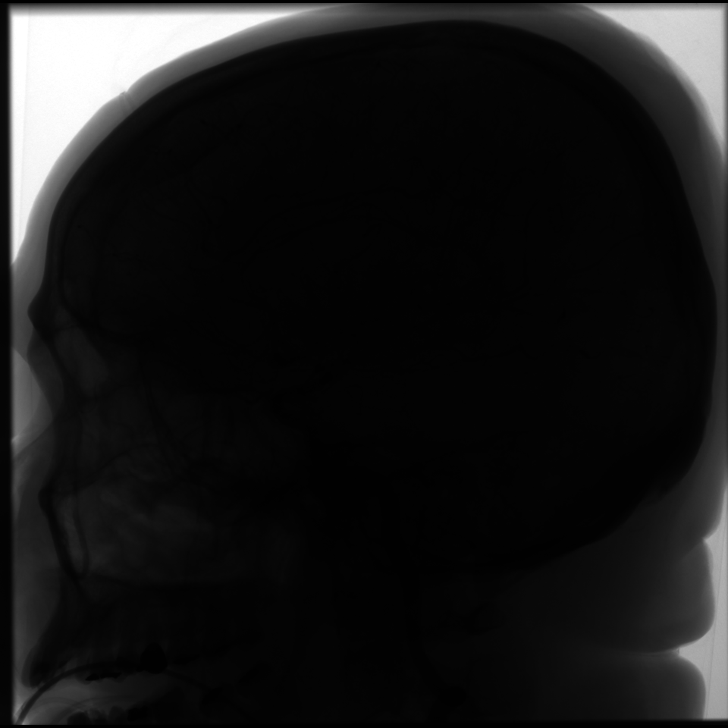

[9 of 9 positions shown; findings below may reference images not displayed]

MEDICATIONS:
Intravenous infusion of 4111 units of heparin.  And for

ANESTHESIA/SEDATION:
The procedure was performed under general anesthesia.

FLUOROSCOPY TIME:  Fluoroscopy Time: 33 minutes 18 seconds (393
mGy).

COMPLICATIONS:
None immediate.
Real-time ultrasound guidance was utilized for vascular access
including the acquisition of a permanent ultrasound image
documenting patency of the accessed vessel.

The right groin was prepped and draped in the usual sterile fashion.
Using a micropuncture kit and the modified Seldinger technique,
access was gained to the right common femoral artery and an 8 French
sheath was placed.

Under fluoroscopy, a 5 Gesilda Shibata 2 catheter was navigated
over a 0.035" Terumo Glidewire into the aortic arch. The catheter
was placed into the left common carotid artery. Frontal and lateral
angiograms of the neck were obtained. The catheter was then advanced
into the left internal carotid artery under biplane roadmap. Frontal
and lateral angiograms of the head were obtained. Intra arterial
infusion of 10 mg of verapamil was performed in the left ICA over 10
minutes for catheter induced vasospasm prevention. 3D rotational
angiograms were acquired and post processed in a separate
workstation under concurrent attending physician supervision.
Selected images were sent to PACS.
FINDINGS: 1. Increased tortuosity of the cervical segment of the left ICA
without hemodynamically significant stenosis.
2. Luminal irregularities in the cavernous and supraclinoid segment
of the left ICA, as well as along the left MCA and bilateral ACA
vascular trees consistent with intracranial atherosclerotic disease
without hemodynamically significant stenosis.
3. A dominant left A 1/ACA supplying the bilateral A2 segments.
4. A 5.3 x 5 x 4.4 mm saccular wide neck aneurysm of the anterior
communicating artery with irregular contour. Neck of the aneurysm
measures 3.6 x 2.7 mm.

PROCEDURE:
The [place] 2 catheter was exchanged over the wire under biplane
roadmap for an infinity long sheath which was placed in the proximal
cervical left ICA. Triaxial navigation of [place] Samsonaite EX distal access
catheter through the infinity sheath and over a Via 17 microcatheter
and a synchro 2 standard micro guidewire into the common segment
left ICA. Magnified frontal and lateral angiograms of the head were
obtained in the working projections. The microcatheter was then
advanced into the aneurysm pouch. Then, a 6 x 3 WEB device was
placed within the aneurysm pouch. Control angiograms with magnified
frontal and lateral views in the working projections were obtained
showing adequate positioning of the device with brisk, unimpeded
flow the parent artery and daughter branches. The device was
subsequently detached. Control angiograms showed is stable device
position no evidence of platelet aggregation at the neck. Magnified
angiograms with frontal and lateral views of the entire head showed
no evidence of thromboembolic complication.
IMPRESSION: Successful and uncomplicated endovascular embolization of with
mm anterior communicating artery aneurysm with placement of a web
device.

PLAN:
1. Patient may discontinue the Brilinta while maintaining ASA 81 mg
daily. At this point, anticoagulation can be resumed.
2. MRA in one month followed by office consultation.

## 2020-11-09 ENCOUNTER — Telehealth (HOSPITAL_COMMUNITY): Payer: Self-pay

## 2020-11-09 NOTE — Telephone Encounter (Signed)
-----   Message from Baldemar Lenis, MD sent at 11/09/2020  9:36 AM EDT ----- Regarding: RE: MRA follow-up It looks great, aneurysm is completely occluded. He can stop aspirin and stay only on Xarelto, unless prescribed for other reasons. Follow-up MRA in 1 year.   ----- Message ----- From: Anderson Malta Sent: 11/09/2020   9:05 AM EDT To: Baldemar Lenis, MD Subject: MRA follow-up                                  Kat,   Please review pt's f/u MRA.   Thanks,  Fara Boros

## 2020-11-17 ENCOUNTER — Ambulatory Visit (INDEPENDENT_AMBULATORY_CARE_PROVIDER_SITE_OTHER): Payer: Medicare Other | Admitting: Cardiology

## 2020-11-17 ENCOUNTER — Encounter: Payer: Self-pay | Admitting: Cardiology

## 2020-11-17 ENCOUNTER — Other Ambulatory Visit: Payer: Self-pay

## 2020-11-17 VITALS — BP 138/76 | HR 70 | Ht 64.0 in | Wt 170.2 lb

## 2020-11-17 DIAGNOSIS — I671 Cerebral aneurysm, nonruptured: Secondary | ICD-10-CM

## 2020-11-17 DIAGNOSIS — Z8673 Personal history of transient ischemic attack (TIA), and cerebral infarction without residual deficits: Secondary | ICD-10-CM | POA: Diagnosis not present

## 2020-11-17 DIAGNOSIS — I10 Essential (primary) hypertension: Secondary | ICD-10-CM | POA: Diagnosis not present

## 2020-11-17 DIAGNOSIS — I480 Paroxysmal atrial fibrillation: Secondary | ICD-10-CM | POA: Diagnosis not present

## 2020-11-17 MED ORDER — METOPROLOL SUCCINATE ER 25 MG PO TB24: 12.5000 mg | ORAL_TABLET | Freq: Every day | ORAL | 3 refills | Status: DC

## 2020-11-17 MED ORDER — AMLODIPINE BESYLATE 5 MG PO TABS
5.0000 mg | ORAL_TABLET | Freq: Every day | ORAL | 3 refills | Status: DC
Start: 2020-11-17 — End: 2022-02-23

## 2020-11-17 NOTE — Patient Instructions (Signed)
Medication Instructions:  Your physician has recommended you make the following change in your medication:  START: Toprol-XL 12.5 mg once daily *If you need a refill on your cardiac medications before your next appointment, please call your pharmacy*   Lab Work: None If you have labs (blood work) drawn today and your tests are completely normal, you will receive your results only by: MyChart Message (if you have MyChart) OR A paper copy in the mail If you have any lab test that is abnormal or we need to change your treatment, we will call you to review the results.   Testing/Procedures: None   Follow-Up: At [facility], you and your health needs are our priority.  As part of our continuing mission to provide you with exceptional heart care, we have created designated Provider Care Teams.  These Care Teams include your primary Cardiologist (physician) and Advanced Practice Providers (APPs -  Physician Assistants and Nurse Practitioners) who all work together to provide you with the care you need, when you need it.  We recommend signing up for the patient portal called "MyChart".  Sign up information is provided on this After Visit Summary.  MyChart is used to connect with patients for Virtual Visits (Telemedicine).  Patients are able to view lab/test results, encounter notes, upcoming appointments, etc.  Non-urgent messages can be sent to your provider as well.   To learn more about what you can do with MyChart, go to [email or web address].     The format for your next appointment:   In Person  Provider:   Thomasene Ripple, DO [address], [place], [place] [address]    Other Instructions

## 2020-11-17 NOTE — Progress Notes (Signed)
Cardiology Office Note:    Date:  11/17/2020   ID:  Blake Carey, DOB 07-27-1955, MRN [id number]  PCP:  Dema Severin, NP  Cardiologist:  Thomasene Ripple, DO  Electrophysiologist:  None   Referring MD: Dema Severin, NP   " I am doing well"  History of Present Illness:    Blake Carey is a 65 y.o. male with a hx of atrial fibrillation previously on eliquis and had a recent left MCA embolic stroke status post TICI3 reperfusion (IR mechanical thrombectomy), therefore he was switched to Xarelto, saccular aneurysm of the ACOM status post coiling, Hypertension, Hyperlipidemia.   I did see the patient on 07/10/2019 and at that time he was status post hospitalization for his CVA.  Atthattime he appeared to be doing well from a cardiovascular standpoint.  We had given you information for medical assistance for his Xarelto.  His blood pressure slightly high we discussed cutting back salt he was in good spirits and was planning his coiling for saccular aneurysm repair.   I saw the patient in September 2021, at that time he appeared to be doing well but his blood pressure slightly elevated.  We discussed cutting back salt.   I saw the patient on January 13, 2020 at that time he was doing well from a cardiovascular standpoint.  We talked about his medications and the fact that he needs to be compliant with it.  He offers no complaints at this time.  He is looking forward to visiting his daughter in [place] which she will be there for 3 months.   Past Medical History:  Diagnosis Date   Acute lower UTI 06/24/2019   AKI (acute kidney injury) (HCC)    05/2019 (contrast nephropathy versus obstructive urinary retention with UTI)   Anemia    low iron   Aneurysm of anterior communicating artery 06/24/2019   Atrial fibrillation (HCC)    Cerebral aneurysm    ACOM aneurysm 06/21/19   Dyslipidemia 12/03/2018   Essential hypertension 12/03/2018   History of CVA (cerebrovascular accident) 07/10/2019   HTN  (hypertension)    Leukocytosis 06/24/2019   Mixed hyperlipidemia 07/10/2019   Obesity (BMI 30-39.9) 12/03/2018   Paroxysmal atrial fibrillation (HCC) 12/03/2018   Persistent atrial fibrillation (HCC) 07/10/2019   Pre-procedure lab exam 12/31/2018   Stroke (cerebrum) (HCC) 06/20/2019   Stroke (HCC)    06/20/19    Past Surgical History:  Procedure Laterality Date   APPENDECTOMY     IR 3D INDEPENDENT WKST  08/05/2019   IR ANGIOGRAM FOLLOW UP STUDY  08/05/2019   IR CT HEAD LTD  06/20/2019   IR PERCUTANEOUS ART THROMBECTOMY/INFUSION INTRACRANIAL INC DIAG ANGIO  06/20/2019   IR TRANSCATH/EMBOLIZ  08/05/2019       IR TRANSCATH/EMBOLIZ  08/05/2019   IR US GUIDE VASC ACCESS RIGHT  06/20/2019   IR US GUIDE VASC ACCESS RIGHT  08/05/2019   KNEE SURGERY Right    RADIOLOGY WITH ANESTHESIA N/A 06/20/2019   Procedure: IR WITH ANESTHESIA;  Surgeon: Julieanne Cotton, MD;  Location: [facility] OR;  Service: Radiology;  Laterality: N/A;   RADIOLOGY WITH ANESTHESIA N/A 08/05/2019   Procedure: ANEURYSM EMBOLIZATION;  Surgeon: Baldemar Lenis, MD;  Location: [facility] OR;  Service: Radiology;  Laterality: N/A;    Current Medications: Current Meds  Medication Sig   amLODipine (NORVASC) 5 MG tablet Take 1 tablet (5 mg total) by mouth daily.   atorvastatin (LIPITOR) 40 MG tablet Take 1 tablet (40 mg total) by mouth  daily at 6 PM.   metoprolol succinate (TOPROL XL) 25 MG 24 hr tablet Take 0.5 tablets (12.5 mg total) by mouth daily.   rivaroxaban (XARELTO) 20 MG TABS tablet Take 1 tablet (20 mg total) by mouth daily with supper. Take with the biggest meal of the day at the same time every day. Do not miss a dose. Set your alarm to remind you to take your Xarelto.     Allergies:   Contrast media [iodinated diagnostic agents]   Social History   Socioeconomic History   Marital status: Unknown    Spouse name: Not on file   Number of children: Not on file   Years of education: Not on file   Highest education level: Not on  file  Occupational History   Occupation: part time  Tobacco Use   Smoking status: Never   Smokeless tobacco: Never  Vaping Use   Vaping Use: Never used  Substance and Sexual Activity   Alcohol use: Not Currently    Comment: sober for over 40 years   Drug use: Never   Sexual activity: Not on file  Other Topics Concern   Not on file  Social History Narrative   Lives with wife   Right Handed    Drinks no caffeine   Social Determinants of Health   Financial Resource Strain: Not on file  Food Insecurity: Not on file  Transportation Needs: Not on file  Physical Activity: Not on file  Stress: Not on file  Social Connections: Not on file     Family History: The patient's family history includes Prostate cancer in his father.  ROS:   Review of Systems  Constitution: Negative for decreased appetite, fever and weight gain.  HENT: Negative for congestion, ear discharge, hoarse voice and sore throat.   Eyes: Negative for discharge, redness, vision loss in right eye and visual halos.  Cardiovascular: Negative for chest pain, dyspnea on exertion, leg swelling, orthopnea and palpitations.  Respiratory: Negative for cough, hemoptysis, shortness of breath and snoring.   Endocrine: Negative for heat intolerance and polyphagia.  Hematologic/Lymphatic: Negative for bleeding problem. Does not bruise/bleed easily.  Skin: Negative for flushing, nail changes, rash and suspicious lesions.  Musculoskeletal: Negative for arthritis, joint pain, muscle cramps, myalgias, neck pain and stiffness.  Gastrointestinal: Negative for abdominal pain, bowel incontinence, diarrhea and excessive appetite.  Genitourinary: Negative for decreased libido, genital sores and incomplete emptying.  Neurological: Negative for brief paralysis, focal weakness, headaches and loss of balance.  Psychiatric/Behavioral: Negative for altered mental status, depression and suicidal ideas.  Allergic/Immunologic: Negative for HIV  exposure and persistent infections.    EKGs/Labs/Other Studies Reviewed:    The following studies were reviewed today:   EKG: None today  Recent Labs: 01/13/2020: BUN 15; Creatinine, Ser 1.22; Hemoglobin 12.6; Magnesium 1.9; Platelets 201; Potassium 4.2; Sodium 143  Recent Lipid Panel    Component Value Date/Time   CHOL 212 (H) 06/21/2019 0236   TRIG 94 06/21/2019 0236   HDL 50 06/21/2019 0236   CHOLHDL 4.2 06/21/2019 0236   VLDL 19 06/21/2019 0236   LDLCALC 143 (H) 06/21/2019 0236    Physical Exam:    VS:  BP 138/76 (Patient Position: Sitting)   Pulse 70   Ht 5\' 4"  (1.626 m)   Wt 170 lb 3.2 oz (77.2 kg)   SpO2 98%   BMI 29.21 kg/m     Wt Readings from Last 3 Encounters:  11/17/20 170 lb 3.2 oz (77.2 kg)  06/16/20  166 lb 11.2 oz (75.6 kg)  01/13/20 154 lb 9.6 oz (70.1 kg)     GEN: Well nourished, well developed in no acute distress HEENT: Normal NECK: No JVD; No carotid bruits LYMPHATICS: No lymphadenopathy CARDIAC: S1S2 noted,RRR, no murmurs, rubs, gallops RESPIRATORY:  Clear to auscultation without rales, wheezing or rhonchi  ABDOMEN: Soft, non-tender, non-distended, +bowel sounds, no guarding. EXTREMITIES: No edema, No cyanosis, no clubbing MUSCULOSKELETAL:  No deformity  SKIN: Warm and dry NEUROLOGIC:  Alert and oriented x 3, non-focal PSYCHIATRIC:  Normal affect, good insight  ASSESSMENT:    1. Paroxysmal atrial fibrillation (HCC)   2. History of CVA (cerebrovascular accident)   3. Aneurysm of anterior communicating artery   4. Essential hypertension    PLAN:    He appears to be doing well from a cardiovascular standpoint.  He continues to take his anticoagulant with Xarelto.  He is not on any rate limiting agent his carvedilol has been stopped patient does have paroxysmal atrial fibrillation having we will be appropriate to start him on low-dose Toprol-XL 12.5 mg daily.  Continues to follow with neurology.  Is looking forward to his visit with  his daughter which will be over 3 months.  Asked patient if he ever sustained a fall while he is out of the emergency department given the fact that he is on anticoagulation.  He plans to take his medications.  The patient is in agreement with the above plan. The patient left the office in stable condition.  The patient will follow up in 1 year or sooner if needed.   Medication Adjustments/Labs and Tests Ordered: Current medicines are reviewed at length with the patient today.  Concerns regarding medicines are outlined above.  No orders of the defined types were placed in this encounter.  Meds ordered this encounter  Medications   metoprolol succinate (TOPROL XL) 25 MG 24 hr tablet    Sig: Take 0.5 tablets (12.5 mg total) by mouth daily.    Dispense:  45 tablet    Refill:  3   amLODipine (NORVASC) 5 MG tablet    Sig: Take 1 tablet (5 mg total) by mouth daily.    Dispense:  90 tablet    Refill:  3    Patient Instructions  Medication Instructions:  Your physician has recommended you make the following change in your medication:  START: Toprol-XL 12.5 mg once daily *If you need a refill on your cardiac medications before your next appointment, please call your pharmacy*   Lab Work: None If you have labs (blood work) drawn today and your tests are completely normal, you will receive your results only by: MyChart Message (if you have MyChart) OR A paper copy in the mail If you have any lab test that is abnormal or we need to change your treatment, we will call you to review the results.   Testing/Procedures: None   Follow-Up: At [facility], you and your health needs are our priority.  As part of our continuing mission to provide you with exceptional heart care, we have created designated Provider Care Teams.  These Care Teams include your primary Cardiologist (physician) and Advanced Practice Providers (APPs -  Physician Assistants and Nurse Practitioners) who all work  together to provide you with the care you need, when you need it.  We recommend signing up for the patient portal called "MyChart".  Sign up information is provided on this After Visit Summary.  MyChart is used to connect with patients for  Virtual Visits (Telemedicine).  Patients are able to view lab/test results, encounter notes, upcoming appointments, etc.  Non-urgent messages can be sent to your provider as well.   To learn more about what you can do with MyChart, go to [email or web address].     The format for your next appointment:   In Person  Provider:   Thomasene Ripple, DO [address], [place], [place] [address]    Other Instructions     Adopting a Healthy Lifestyle.  Know what a healthy weight is for you (roughly BMI <25) and aim to maintain this   Aim for 7+ servings of fruits and vegetables daily   65-80+ fluid ounces of water or unsweet tea for healthy kidneys   Limit to max 1 drink of alcohol per day; avoid smoking/tobacco   Limit animal fats in diet for cholesterol and heart health - choose grass fed whenever available   Avoid highly processed foods, and foods high in saturated/trans fats   Aim for low stress - take time to unwind and care for your mental health   Aim for 150 min of moderate intensity exercise weekly for heart health, and weights twice weekly for bone health   Aim for 7-9 hours of sleep daily   When it comes to diets, agreement about the perfect plan isnt easy to find, even among the experts. Experts at the [facility] [employer] developed an idea known as the Healthy Eating Plate. Just imagine a plate divided into logical, healthy portions.   The emphasis is on diet quality:   Load up on vegetables and fruits - one-half of your plate: Aim for color and variety, and remember that potatoes dont count.   Go for whole grains - one-quarter of your plate: Whole wheat, barley, wheat berries, quinoa, oats, brown rice, and foods made  with them. If you want pasta, go with whole wheat pasta.   Protein power - one-quarter of your plate: Fish, chicken, beans, and nuts are all healthy, versatile protein sources. Limit red meat.   The diet, however, does go beyond the plate, offering a few other suggestions.   Use healthy plant oils, such as olive, canola, soy, corn, sunflower and peanut. Check the labels, and avoid partially hydrogenated oil, which have unhealthy trans fats.   If youre thirsty, drink water. Coffee and tea are good in moderation, but skip sugary drinks and limit milk and dairy products to one or two daily servings.   The type of carbohydrate in the diet is more important than the amount. Some sources of carbohydrates, such as vegetables, fruits, whole grains, and beans-are healthier than others.   Finally, stay active  Signed, Thomasene Ripple, DO  11/17/2020 4:56 PM    [facility]

## 2021-06-27 ENCOUNTER — Encounter: Payer: Self-pay | Admitting: Neurology

## 2021-06-27 ENCOUNTER — Ambulatory Visit: Payer: Medicare Other | Admitting: Neurology

## 2021-11-13 ENCOUNTER — Encounter: Payer: Self-pay | Admitting: Neurology

## 2021-11-13 ENCOUNTER — Ambulatory Visit: Payer: Medicare Other | Admitting: Neurology

## 2021-11-13 VITALS — BP 148/77 | HR 71 | Ht 64.0 in | Wt 170.0 lb

## 2021-11-13 DIAGNOSIS — Z8673 Personal history of transient ischemic attack (TIA), and cerebral infarction without residual deficits: Secondary | ICD-10-CM

## 2021-11-13 DIAGNOSIS — I480 Paroxysmal atrial fibrillation: Secondary | ICD-10-CM

## 2021-11-13 NOTE — Progress Notes (Signed)
[facility] [address] [place] [address] [phone]       OFFICE FOLLOW-UP NOTE  Mr. Delon Cappella Date of Birth:  Aug 08, 1955 Medical Record Number:  [id number]   HPI: Mr. Etsitty is a 66 year old Hispanic male seen today for initial office follow-up visit following hospital admission for stroke in May 2021.  Is accompanied by his daughter [name] who translates for him today with his permission.  I personally reviewed history of presenting illness, electronic medical records and imaging films in PACS. Livingston Stuard is an 66 y.o. male with a history of HTN and a-fib, on Eliquis, who presents via EMS as a Code Stroke after acute onset of right hemiplegia and aphasia while eating at a restaurant this evening. Time of onset is the same as LKW: 1845. He was witnessed by family to become non-verbal and not acting right, as well as unable to follow instructions. He has no prior history of stroke. He was not a tPA candidate as he was on eliquis. Found to have a L M1 LVO. Sent to IR for mechanical thrombectomy. With TICI 3 revascularization by Dr Loreta Ave.   Patient did very well and was kept in the ICU and extubated and had no physical deficits on exam.  CT angiogram is also shown a 5 mm anterior communicating artery aneurysm which was incidental.  MRI scan of the brain showed tiny scattered acute infarcts in the left MCA distribution mostly in the basal ganglia and punctate acute high right frontal incidental infarct.  MRA showed patent left M1 segment after treatment.  2D echo showed normal ejection fraction.  LDL cholesterol was elevated at 143 mg percent and hemoglobin A1c at 6.0.  Urine drug screen was negative.  Patient was on Eliquis prior to admission he was initially treated with IV heparin with plans to switch to warfarin however since patient was discharged over the weekend there was lack of ability to check his INR and to contact primary care physician to do this so  ends transition of care pharmacy arrange instead to give the patient 30-day free supply of Xarelto and patient was discharged on this with plans to switch to warfarin after being followed by primary care physician who would also follow INR.  Patient also returned subsequently and saw Dr. Whitney Post who acquired is asymptomatic a common rhythm on 08/05/2019 and procedure was uneventful.  He was started on aspirin 81 in addition to the Xarelto.  Patient is tolerating both medications well with minor bruising but no bleeding.  He is recovered back to his baseline and has no complaints today.  Blood pressures well controlled at home though today it is slightly elevated in the office at 147/91.  Is tolerating Lipitor well without muscle aches and pains.  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  He has not yet seen his primary care physician to set up switching Xarelto to warfarin and have regular INR checks.  He states he cannot afford the co-pay for his Xarelto Update 06/16/2020: Patient is seen today for follow-up after last visit nearly 10 months ago.  Is accompanied by Spanish language interpreter Rodena Medin was present throughout this visit.  Patient states he is doing well.  He said no recurrent stroke or TIA symptoms.  He has had no new neurological or other health problems.  He had trouble affording Eliquis since his cardiologist had switched him to Xarelto 20 mg daily which he cannot afford  the co-pay.  Is tolerating it well with only minor bruising and no bleeding.  Patient was advised at last visit to follow-up with neuro interventional radiologist to the follow-up on his cerebral aneurysms but unclear reason that has not happened.  He states his blood pressure is under good control on Norvasc and today it is borderline in the office at 149/84.  He remains on Lipitor 40 mg daily she is tolerating well without any side effects.  He has no complaints today Update 11/13/2021 : He  returns for follow-up after last visit almost a year and a half ago.  He is accompanied by [place] language interpreter Valentina Gu.  Patient states he is doing well.  He has had no recurrent stroke or TIA symptoms now for 2 and half years.  He has no residual stroke deficits.  He remains on Xarelto for his A-fib which is tolerating well without significant bleeding or bruising.  His blood pressure is well controlled with systolics in the 130-140s range at home is 148/77 in the office. Lipitor 40 mg daily which is tolerating well without muscle aches and pains.  It is unclear when his last lipid profile was checked.  He has no complaints today.Marland Kitchen  He has not kept any follow-up appointment for his cerebral aneurysm with neuro interventional radiologist. ROS:   14 system review of systems is positive for no complaints today  PMH:  Past Medical History:  Diagnosis Date   Acute lower UTI 06/24/2019   AKI (acute kidney injury) (HCC)    05/2019 (contrast nephropathy versus obstructive urinary retention with UTI)   Anemia    low iron   Aneurysm of anterior communicating artery 06/24/2019   Atrial fibrillation (HCC)    Cerebral aneurysm    ACOM aneurysm 06/21/19   Dyslipidemia 12/03/2018   Essential hypertension 12/03/2018   History of CVA (cerebrovascular accident) 07/10/2019   HTN (hypertension)    Leukocytosis 06/24/2019   Mixed hyperlipidemia 07/10/2019   Obesity (BMI 30-39.9) 12/03/2018   Paroxysmal atrial fibrillation (HCC) 12/03/2018   Persistent atrial fibrillation (HCC) 07/10/2019   Pre-procedure lab exam 12/31/2018   Stroke (cerebrum) (HCC) 06/20/2019   Stroke (HCC)    06/20/19    Social History:  Social History   Socioeconomic History   Marital status: Unknown    Spouse name: Not on file   Number of children: Not on file   Years of education: Not on file   Highest education level: Not on file  Occupational History   Occupation: part time  Tobacco Use   Smoking status: Never   Smokeless  tobacco: Never  Vaping Use   Vaping Use: Never used  Substance and Sexual Activity   Alcohol use: Not Currently    Comment: sober for over 40 years   Drug use: Never   Sexual activity: Not on file  Other Topics Concern   Not on file  Social History Narrative   Lives with wife   Right Handed    Drinks no caffeine   Social Determinants of Health   Financial Resource Strain: Not on file  Food Insecurity: Not on file  Transportation Needs: Not on file  Physical Activity: Not on file  Stress: Not on file  Social Connections: Not on file  Intimate Partner Violence: Not on file    Medications:   Current Outpatient Medications on File Prior to Visit  Medication Sig Dispense Refill   atorvastatin (LIPITOR) 40 MG tablet Take 1 tablet (40 mg total) by mouth  daily at 6 PM. 30 tablet 2   metoprolol succinate (TOPROL XL) 25 MG 24 hr tablet Take 0.5 tablets (12.5 mg total) by mouth daily. 45 tablet 3   rivaroxaban (XARELTO) 20 MG TABS tablet Take 1 tablet (20 mg total) by mouth daily with supper. Take with the biggest meal of the day at the same time every day. Do not miss a dose. Set your alarm to remind you to take your Xarelto. 30 tablet 1   amLODipine (NORVASC) 5 MG tablet Take 1 tablet (5 mg total) by mouth daily. 90 tablet 3   No current facility-administered medications on file prior to visit.    Allergies:   Allergies  Allergen Reactions   Contrast Media [Iodinated Contrast Media] Other (See Comments)    Affects Kidney Function     Physical Exam General: Frail middle-aged Hispanic male, seated, in no evident distress Head: head normocephalic and atraumatic.  Neck: supple with no carotid or supraclavicular bruits Cardiovascular: regular rate and rhythm, no murmurs Musculoskeletal: no deformity Skin:  no rash/petichiae Vascular:  Normal pulses all extremities Vitals:   11/13/21 1519  BP: (!) 148/77  Pulse: 71   Neurologic Exam Mental Status: Awake and fully alert.  Oriented to place and time. Recent and remote memory intact. Attention span, concentration and fund of knowledge appropriate. Mood and affect appropriate.  Cranial Nerves: Fundoscopic exam not done today.  Pupils equal, briskly reactive to light. Extraocular movements full without nystagmus. Visual fields full to confrontation. Hearing intact. Facial sensation intact. Face, tongue, palate moves normally and symmetrically.  Motor: Normal bulk and tone. Normal strength in all tested extremity muscles. Sensory.: intact to touch ,pinprick .position and vibratory sensation.  Coordination: Rapid alternating movements normal in all extremities. Finger-to-nose and heel-to-shin performed accurately bilaterally. Gait and Station: Arises from chair without difficulty. Stance is normal. Gait demonstrates normal stride length and balance . Able to heel, toe and tandem walk without difficulty.  Reflexes: 1+ and symmetric. Toes downgoing.      ASSESSMENT: 66 year old Hispanic male with left MCA infarct in May 2021 due to left MCA occlusion s/p mechanical thrombectomy due to atrial fibrillation.  Also incidental 5 mm anterior communicating artery aneurysm s/p endovascular coiling treatment on 08/05/2019.  He appears stable from neurovascular standpoint     PLAN: I had a long d/w patient using a spanish language interpreter about his recent stroke, atrial fibrillation,risk for recurrent stroke/TIAs, personally independently reviewed imaging studies and stroke evaluation results and answered questions.Continue Xarelto (rivaroxaban) 20 mg once daily  for secondary stroke prevention and maintain strict control of hypertension with blood pressure goal below 130/90, diabetes with hemoglobin A1c goal below 6.5% and lipids with LDL cholesterol goal below 70 mg/dL. I also advised the patient to eat a healthy diet with plenty of whole grains, cereals, fruits and vegetables, exercise regularly and maintain ideal body weight  Check f/u lipid panel and HbA1c today.Followup in the future with me in 1 year or call earlier if needed. Greater than 50% of time during this 35 minute visit was spent on counseling,explanation of diagnosis embolic stroke and brain aneurysm, planning of further management, discussion with patient and family and coordination of care Antony Contras, MD  [facility] [address] [place], [place] [address]  Phone [phone] Fax [phone]: This document was prepared with digital dictation and possible smart phrase technology. Any transcriptional errors that result from this process are unintentional

## 2021-11-13 NOTE — Patient Instructions (Signed)
I had a long d/w patient using a spanish language interpreter about his recent stroke, atrial fibrillation,risk for recurrent stroke/TIAs, personally independently reviewed imaging studies and stroke evaluation results and answered questions.Continue Xarelto (rivaroxaban) 20 mg once daily  for secondary stroke prevention and maintain strict control of hypertension with blood pressure goal below 130/90, diabetes with hemoglobin A1c goal below 6.5% and lipids with LDL cholesterol goal below 70 mg/dL. I also advised the patient to eat a healthy diet with plenty of whole grains, cereals, fruits and vegetables, exercise regularly and maintain ideal body weight Check f/u lipid panel and HbA1c today.Followup in the future with me in 1 year or call earlier if needed.  Prevencin del accidente cerebrovascular Stroke Prevention Algunas enfermedades y conductas pueden dar lugar a un aumento en el riesgo de sufrir un accidente cerebrovascular. Puede ayudar a prevenir un accidente cerebrovascular optando por una alimentacin saludable, hacer ejercicio, no fumar y Theatre manager bajo control cualquier afeccin mdica que tenga. El accidente cerebrovascular es una de las principales causas del deterioro funcional. La prevencin primaria es especialmente importante porque la mayora de los accidentes cerebrovasculares son eventos que se producen por primera vez. El accidente cerebrovascular cambia la vida no solo de [employer] lo tienen, sino tambin de su familia y otros cuidadores. Cmo puede afectarme esta enfermedad? Un accidente cerebrovascular es una emergencia mdica y debe tratarse de inmediato. El accidente cerebrovascular puede causar dao cerebral y, a veces, ser potencialmente mortal. Si la persona recibe tratamiento mdico de inmediato, hay ms probabilidades de sobrevivir y de recuperarse de un accidente cerebrovascular. [place]? Las siguientes enfermedades pueden aumentar el riesgo de esta  afeccin: Enfermedad cardiovascular. Presin arterial alta (hipertensin). Diabetes. Colesterol alto. Anemia drepanoctica. Trastornos de Emergency planning/management officer). Obesidad. Trastornos del sueo (apnea obstructiva del sueo). Otros factores de riesgo son los siguientes: Ser mayor de 28 aos de edad. Tener antecedentes de cogulos de [place], accidente cerebrovascular o miniaccidente cerebrovascular (accidente isqumico transitorio, AIT). Factores genticos, como raza, origen tnico o antecedentes familiares de accidente cerebrovascular. Fumar cigarrillos o consumir otros productos que contengan tabaco. Tomar pldoras anticonceptivas, en especial si tambin consume tabaco. Gran consumo de alcohol o drogas, especialmente cocana y metanfetamina. Falta de actividad fsica. Qu medidas de prevencin puedo tomar? Controle su estado de salud Niveles elevados de colesterol. Llevar una dieta saludable es importante para prevenir el colesterol alto. Si el colesterol no puede controlarse nicamente con la dieta, es posible que deba tomar medicamentos. Tome los medicamentos para Academic librarian como se lo haya indicado su mdico. Hipertensin. Para reducir su riesgo de accidente cerebrovascular, trate de mantener su presin arterial por debajo de 130/80. Llevar una dieta saludable y hacer ejercicio regularmente son importantes del control de la presin arterial. Si estas medidas no son suficientes para controlar su presin arterial, es posible que tenga que tomar medicamentos. Tome los medicamentos para controlar la hipertensin como se lo haya indicado su mdico. Pregntele a su mdico si debera medirse su presin arterial en el hogar. Controle su presin arterial todos los aos, incluso si su presin arterial es normal. La presin arterial aumenta con la edad y algunas afecciones mdicas. Diabetes. Llevar una dieta saludable y hacer ejercicio regularmente son partes  importantes del control de su nivel de azcar en la sangre (glucosa). Si su nivel de azcar en la sangre no puede controlarse mediante dieta y ejercicio, es posible que deba tomar medicamentos. Tome los medicamentos para controlar la diabetes como se lo haya  indicado su mdico. Hgase una evaluacin de la apnea obstructiva del sueo. Hable con su mdico sobre cmo hacer una evaluacin del sueo si ronca mucho o tiene excesiva somnolencia. Asegrese de tener bajo control cualquier otra afeccin mdica que tenga, como la fibrilacin auricular o la aterosclerosis. Nutricin Siga las instrucciones de su mdico sobre qu comer o beber para Contractor a Chief Operating Officer su estado de [place]. Estas instrucciones pueden incluir lo siguiente: Reducir su ingesta calrica diaria. Limitar la cantidad de sal (sodio) que ingiere a 1500 miligramos (mg) por Futures trader. Usar solo aceites saludables para cocinar, como el aceite de [place], canola o [place]. Consumir alimentos saludables. Para esto, puede hacer lo siguiente: Elija alimentos ricos en fibra, como cereales integrales y frutas y verduras frescas. Coma al menos 5 porciones de frutas y verduras al da. Trate de llenar la mitad del plato de cada comida con frutas y verduras. Elija alimentos con protenas magras, como cortes de carne magros, carne de ave sin piel, pescado, tofu, frijoles y nueces. Coma productos lcteos descremados. Evite los alimentos con alto contenido de [place]. Esto puede ayudar a Personal assistant presin arterial. Evite los alimentos que contienen grasas saturadas, grasas trans y [place]. Esto puede ayudar a Architectural technologist. Evite los alimentos procesados y preparados. Contar su consumo diario de carbohidratos.  Estilo de vida Si bebe alcohol: Limite la cantidad que bebe a lo siguiente: De 0 a 1 medida por da para las mujeres que no estn embarazadas. De 0 a 2 medidas por da para los hombres. Sepa cunta cantidad de alcohol hay en las bebidas  que toma. En los [address], una medida equivale a una botella de cerveza de 12 oz (355 ml), un vaso de vino de 5 oz (148 ml) o un vaso de una bebida alcohlica de alta graduacin de 1 oz (44 ml). No consuma ningn producto que contenga nicotina o tabaco. Estos productos incluyen cigarrillos, tabaco para Theatre manager y aparatos de vapeo, como los Administrator, Civil Service. Si necesita ayuda para dejar de fumar, consulte al mdico. Evite ser fumador pasivo. No consuma drogas. Actividad  Intente mantener un peso saludable. Trate de hacer al menos 30 minutos de ejercicio casi todos [place], por ejemplo: Caminar rpidamente. Andar en bicicleta. Natacin. Medicamentos [employer] medicamentos de venta libre y los recetados solamente como se lo haya indicado el mdico. Es posible que se le recomiende tomar aspirina o un diluyente de la sangre (antiplaquetario o Environmental education officer) para reducir Nurse, adult de formacin de cogulos de sangre que pueden provocar una accidente cerebrovascular. Evite tomar pldoras anticonceptivas. Hable con su mdico [employer] de tomar pldoras anticonceptivas si: Es mayor de 35 aos. Fuma. Tiene dolores de [place] muy intensos. Alguna vez ha tenido un cogulo de [place]. Dnde buscar ms informacin American Stroke Association (Asociacin Americana de Accidente Cerebrovascular): www.strokeassociation.org Solicite ayuda de inmediato si: Usted o un ser querido tiene algn sntoma de un accidente cerebrovascular. "BE FAST" es una manera fcil de recordar los principales signos de advertencia de un accidente cerebrovascular: B: Balance (equilibrio). Los signos son mareos, dificultad repentina para caminar o prdida del equilibrio. E: Eyes (ojos). Los signos son problemas para ver o un cambio repentino en la visin. F: Face (rostro). Los signos son debilidad repentina o adormecimiento del rostro, o el rostro o el prpado que se caen hacia un lado. A: Arms (brazos). Los signos  son debilidad o adormecimiento en un brazo. Esto sucede de repente y generalmente en un lado del cuerpo. S:  Speech (habla). Los signos son dificultad para hablar, hablar arrastrando las palabras o dificultad para comprender lo que las Forensic scientist. T: Time (tiempo). Es tiempo de llamar al servicio de Sports administrator. Anote la hora a la que [employer] sntomas. Usted o un ser querido tiene otros signos de un accidente cerebrovascular, como: Dolor de cabeza sbito e intenso que no tiene causa aparente. Nuseas o vmitos. Convulsiones. Estos sntomas pueden representar un problema grave que constituye Radio broadcast assistant. No espere a ver si los sntomas desaparecen. Solicite atencin mdica de inmediato. Comunquese con el servicio de emergencias de su localidad (911 en los [place]). No conduzca por sus propios medios [employer] hospital. Resumen Puede prevenir un accidente cerebrovascular optando por una alimentacin saludable, hacer ejercicio, no fumar, limitar el consumo de alcohol, y manteniendo bajo control cualquier afeccin mdica que tenga. No consuma ningn producto que contenga nicotina o tabaco. Estos incluyen cigarrillos, tabaco para mascar y aparatos de vapeo, como los Administrator, Civil Service. Si necesita ayuda para dejar de fumar, consulte al mdico. Recuerde las seales de alerta de un accidente cerebrovascular "BE FAST". Obtenga ayuda de inmediato si usted o un ser querido presenta alguna de estas seales de Control and instrumentation engineer. Esta informacin no tiene Theme park manager el consejo del mdico. Asegrese de hacerle al mdico cualquier pregunta que tenga. Document Revised: 10/02/2019 Document Reviewed: 10/02/2019 Elsevier Patient Education  2023 [employer].

## 2021-11-14 LAB — HEMOGLOBIN A1C
Est. average glucose Bld gHb Est-mCnc: 120 mg/dL
Hgb A1c MFr Bld: 5.8 % — ABNORMAL HIGH (ref 4.8–5.6)

## 2021-11-14 LAB — LIPID PANEL
Chol/HDL Ratio: 2.7 ratio (ref 0.0–5.0)
Cholesterol, Total: 123 mg/dL (ref 100–199)
HDL: 46 mg/dL (ref >39)
LDL Chol Calc (NIH): 62 mg/dL (ref 0–99)
Triglycerides: 72 mg/dL (ref 0–149)
VLDL Cholesterol Cal: 15 mg/dL (ref 5–40)

## 2021-12-02 NOTE — Progress Notes (Signed)
Patient has not reviewed these results currently inform patient

## 2021-12-05 ENCOUNTER — Telehealth: Payer: Self-pay | Admitting: Neurology

## 2021-12-05 NOTE — Telephone Encounter (Signed)
-----   Message from Garvin Fila, MD sent at 12/02/2021  3:20 PM EDT ----- Patient has not reviewed these results currently inform patient

## 2021-12-05 NOTE — Telephone Encounter (Signed)
Called the patient and there was no answer and VM was full. Called the daughter listed on file that was present as a Optometrist for the patietn at the visit and she didn't answer. LVM advising the results were posted on mychart or they could call us back to review.   **If daughter returns call please advise that the lab work was normal that was completed 11/13/2021.

## 2021-12-11 ENCOUNTER — Encounter: Payer: Self-pay | Admitting: Cardiology

## 2021-12-11 MED ORDER — METOPROLOL SUCCINATE ER 25 MG PO TB24: 12.5000 mg | ORAL_TABLET | Freq: Every day | ORAL | 1 refills | Status: DC

## 2022-02-23 ENCOUNTER — Other Ambulatory Visit: Payer: Self-pay

## 2022-02-23 ENCOUNTER — Encounter: Payer: Self-pay | Admitting: Cardiology

## 2022-02-23 ENCOUNTER — Ambulatory Visit: Payer: Medicare Other | Attending: Cardiology | Admitting: Cardiology

## 2022-02-23 VITALS — BP 142/72 | HR 65 | Ht 64.0 in | Wt 167.5 lb

## 2022-02-23 DIAGNOSIS — Z8673 Personal history of transient ischemic attack (TIA), and cerebral infarction without residual deficits: Secondary | ICD-10-CM

## 2022-02-23 DIAGNOSIS — I671 Cerebral aneurysm, nonruptured: Secondary | ICD-10-CM

## 2022-02-23 DIAGNOSIS — I480 Paroxysmal atrial fibrillation: Secondary | ICD-10-CM | POA: Diagnosis not present

## 2022-02-23 DIAGNOSIS — I10 Essential (primary) hypertension: Secondary | ICD-10-CM | POA: Diagnosis not present

## 2022-02-23 MED ORDER — ATORVASTATIN CALCIUM 40 MG PO TABS: 40.0000 mg | ORAL_TABLET | Freq: Every day | ORAL | 3 refills | Status: DC

## 2022-02-23 MED ORDER — RIVAROXABAN 20 MG PO TABS: 20.0000 mg | ORAL_TABLET | Freq: Every day | ORAL | 3 refills | Status: DC

## 2022-02-23 MED ORDER — AMLODIPINE BESYLATE 5 MG PO TABS: 5.0000 mg | ORAL_TABLET | Freq: Every day | ORAL | 3 refills | Status: AC

## 2022-02-23 MED ORDER — METOPROLOL SUCCINATE ER 25 MG PO TB24: 12.5000 mg | ORAL_TABLET | Freq: Every day | ORAL | 3 refills | Status: DC

## 2022-02-23 NOTE — Progress Notes (Signed)
Cardiology Office Note:    Date:  02/24/2022   ID:  Blake Carey, DOB 10-14-1955, MRN [id number]  PCP:  Rhea Bleacher, NP  Cardiologist:  Berniece Salines, DO  Electrophysiologist:  None   Referring MD: Rhea Bleacher, NP   " I am doing well"  History of Present Illness:    Blake Carey is a 67 y.o. male with a hx of atrial fibrillation previously on eliquis and had a recent left MCA embolic stroke status post TICI3 reperfusion (IR mechanical thrombectomy), therefore he was switched to Xarelto, saccular aneurysm of the ACOM status post coiling, Hypertension, Hyperlipidemia.   I did see the patient on 07/10/2019 and at that time he was status post hospitalization for his CVA.  Atthattime he appeared to be doing well from a cardiovascular standpoint.  We had given you information for medical assistance for his Xarelto.  His blood pressure slightly high we discussed cutting back salt he was in good spirits and was planning his coiling for saccular aneurysm repair.   I saw the patient in September 2021, at that time he appeared to be doing well but his blood pressure slightly elevated.  We discussed cutting back salt.   I saw the patient on January 13, 2020 at that time he was doing well from a cardiovascular standpoint.  We talked about his medications and the fact that he needs to be compliant with it.  He offers no complaints at this time.  His last visit was on 11/17/2020 at that time I started patient on Toprol-XL given his paroxysmal atrial fibrillation.  He is here today for follow-up visit.  He offers no complaints at this time.  He is doing well.  He is back to work.  He is very happy and pleased with his recovery.   Past Medical History:  Diagnosis Date   Acute lower UTI 06/24/2019   AKI (acute kidney injury) ([place])    05/2019 (contrast nephropathy versus obstructive urinary retention with UTI)   Anemia    low iron   Aneurysm of anterior communicating artery 06/24/2019    Atrial fibrillation ([place])    Cerebral aneurysm    ACOM aneurysm 06/21/19   Dyslipidemia 12/03/2018   Essential hypertension 12/03/2018   History of CVA (cerebrovascular accident) 07/10/2019   HTN (hypertension)    Leukocytosis 06/24/2019   Mixed hyperlipidemia 07/10/2019   Obesity (BMI 30-39.9) 12/03/2018   Paroxysmal atrial fibrillation ([place]) 12/03/2018   Persistent atrial fibrillation ([place]) 07/10/2019   Pre-procedure lab exam 12/31/2018   Stroke (cerebrum) ([place]) 06/20/2019   Stroke ([place])    06/20/19    Past Surgical History:  Procedure Laterality Date   APPENDECTOMY     IR 3D INDEPENDENT WKST  08/05/2019   IR ANGIOGRAM FOLLOW UP STUDY  08/05/2019   IR CT HEAD LTD  06/20/2019   IR PERCUTANEOUS ART THROMBECTOMY/INFUSION INTRACRANIAL INC DIAG ANGIO  06/20/2019   IR TRANSCATH/EMBOLIZ  08/05/2019       IR TRANSCATH/EMBOLIZ  08/05/2019   IR US GUIDE VASC ACCESS RIGHT  06/20/2019   IR US GUIDE VASC ACCESS RIGHT  08/05/2019   KNEE SURGERY Right    RADIOLOGY WITH ANESTHESIA N/A 06/20/2019   Procedure: IR WITH ANESTHESIA;  Surgeon: Luanne Bras, MD;  Location: [facility];  Service: Radiology;  Laterality: N/A;   RADIOLOGY WITH ANESTHESIA N/A 08/05/2019   Procedure: ANEURYSM EMBOLIZATION;  Surgeon: Pedro Earls, MD;  Location: [place];  Service: Radiology;  Laterality: N/A;  Current Medications: Current Meds  Medication Sig   [DISCONTINUED] atorvastatin (LIPITOR) 40 MG tablet Take 1 tablet (40 mg total) by mouth daily at 6 PM.   [DISCONTINUED] metoprolol succinate (TOPROL XL) 25 MG 24 hr tablet Take 0.5 tablets (12.5 mg total) by mouth daily.   [DISCONTINUED] rivaroxaban (XARELTO) 20 MG TABS tablet Take 1 tablet (20 mg total) by mouth daily with supper. Take with the biggest meal of the day at the same time every day. Do not miss a dose. Set your alarm to remind you to take your Xarelto.     Allergies:   Contrast media [iodinated contrast media]   Social History   Socioeconomic History    Marital status: Unknown    Spouse name: Not on file   Number of children: Not on file   Years of education: Not on file   Highest education level: Not on file  Occupational History   Occupation: part time  Tobacco Use   Smoking status: Never   Smokeless tobacco: Never  Vaping Use   Vaping Use: Never used  Substance and Sexual Activity   Alcohol use: Not Currently    Comment: sober for over 40 years   Drug use: Never   Sexual activity: Not on file  Other Topics Concern   Not on file  Social History Narrative   Lives with wife   Right Handed    Drinks no caffeine   Social Determinants of Health   Financial Resource Strain: Not on file  Food Insecurity: Not on file  Transportation Needs: Not on file  Physical Activity: Not on file  Stress: Not on file  Social Connections: Not on file     Family History: The patient's family history includes Prostate cancer in his father.  ROS:   Review of Systems  Constitution: Negative for decreased appetite, fever and weight gain.  HENT: Negative for congestion, ear discharge, hoarse voice and sore throat.   Eyes: Negative for discharge, redness, vision loss in right eye and visual halos.  Cardiovascular: Negative for chest pain, dyspnea on exertion, leg swelling, orthopnea and palpitations.  Respiratory: Negative for cough, hemoptysis, shortness of breath and snoring.   Endocrine: Negative for heat intolerance and polyphagia.  Hematologic/Lymphatic: Negative for bleeding problem. Does not bruise/bleed easily.  Skin: Negative for flushing, nail changes, rash and suspicious lesions.  Musculoskeletal: Negative for arthritis, joint pain, muscle cramps, myalgias, neck pain and stiffness.  Gastrointestinal: Negative for abdominal pain, bowel incontinence, diarrhea and excessive appetite.  Genitourinary: Negative for decreased libido, genital sores and incomplete emptying.  Neurological: Negative for brief paralysis, focal weakness,  headaches and loss of balance.  Psychiatric/Behavioral: Negative for altered mental status, depression and suicidal ideas.  Allergic/Immunologic: Negative for HIV exposure and persistent infections.    EKGs/Labs/Other Studies Reviewed:    The following studies were reviewed today:   EKG: Atrial fibrillation with controlled ventricular rate  Recent Labs: No results found for requested labs within last 365 days.  Recent Lipid Panel    Component Value Date/Time   CHOL 123 11/13/2021 1541   TRIG 72 11/13/2021 1541   HDL 46 11/13/2021 1541   CHOLHDL 2.7 11/13/2021 1541   CHOLHDL 4.2 06/21/2019 0236   VLDL 19 06/21/2019 0236   LDLCALC 62 11/13/2021 1541    Physical Exam:    VS:  BP (!) 142/72   Pulse 65   Ht 5\' 4"  (1.626 m)   Wt 76 kg   SpO2 99%   BMI 28.75  kg/m     Wt Readings from Last 3 Encounters:  02/23/22 76 kg  11/13/21 77.1 kg  11/17/20 77.2 kg     GEN: Well nourished, well developed in no acute distress HEENT: Normal NECK: No JVD; No carotid bruits LYMPHATICS: No lymphadenopathy CARDIAC: S1S2 noted,RRR, no murmurs, rubs, gallops RESPIRATORY:  Clear to auscultation without rales, wheezing or rhonchi  ABDOMEN: Soft, non-tender, non-distended, +bowel sounds, no guarding. EXTREMITIES: No edema, No cyanosis, no clubbing MUSCULOSKELETAL:  No deformity  SKIN: Warm and dry NEUROLOGIC:  Alert and oriented x 3, non-focal PSYCHIATRIC:  Normal affect, good insight  ASSESSMENT:    1. Paroxysmal atrial fibrillation (HCC)   2. History of CVA (cerebrovascular accident)   3. Aneurysm of anterior communicating artery   4. Essential hypertension     PLAN:    Clinically appears to be doing well from a cardiovascular standpoint.  He needed refills for his medication work on the same dose today.  90-day supply  Continues to follow with neurology.  He is seen in atrial fibrillation today but I am hesitant to proceed with rhythm control given the fact that he has not  had some of his medication and is requesting refills.  During his follow-up we will get an EKG and if he is in atrial fibrillation then we will attempt DCCV.  The patient is in agreement with the above plan. The patient left the office in stable condition.  The patient will follow up in 1 year or sooner if needed.   Medication Adjustments/Labs and Tests Ordered: Current medicines are reviewed at length with the patient today.  Concerns regarding medicines are outlined above.  Orders Placed This Encounter  Procedures   EKG 12-Lead    Meds ordered this encounter  Medications   metoprolol succinate (TOPROL XL) 25 MG 24 hr tablet    Sig: Take 0.5 tablets (12.5 mg total) by mouth daily.    Dispense:  45 tablet    Refill:  3   amLODipine (NORVASC) 5 MG tablet    Sig: Take 1 tablet (5 mg total) by mouth daily.    Dispense:  90 tablet    Refill:  3   atorvastatin (LIPITOR) 40 MG tablet    Sig: Take 1 tablet (40 mg total) by mouth daily at 6 PM.    Dispense:  90 tablet    Refill:  3    Patient Instructions  Medication Instructions:  All cardiac medications refilled today. *If you need a refill on your cardiac medications before your next appointment, please call your pharmacy*   Lab Work: None   Testing/Procedures: None   Follow-Up: At [facility], you and your health needs are our priority.  As part of our continuing mission to provide you with exceptional heart care, we have created designated Provider Care Teams.  These Care Teams include your primary Cardiologist (physician) and Advanced Practice Providers (APPs -  Physician Assistants and Nurse Practitioners) who all work together to provide you with the care you need, when you need it.  We recommend signing up for the patient portal called "MyChart".  Sign up information is provided on this After Visit Summary.  MyChart is used to connect with patients for Virtual Visits (Telemedicine).  Patients are able to view  lab/test results, encounter notes, upcoming appointments, etc.  Non-urgent messages can be sent to your provider as well.   To learn more about what you can do with MyChart, go to [email or web address].  Your next appointment:   1 year(s)  Provider:   Thomasene Ripple, DO      Adopting a Healthy Lifestyle.  Know what a healthy weight is for you (roughly BMI <25) and aim to maintain this   Aim for 7+ servings of fruits and vegetables daily   65-80+ fluid ounces of water or unsweet tea for healthy kidneys   Limit to max 1 drink of alcohol per day; avoid smoking/tobacco   Limit animal fats in diet for cholesterol and heart health - choose grass fed whenever available   Avoid highly processed foods, and foods high in saturated/trans fats   Aim for low stress - take time to unwind and care for your mental health   Aim for 150 min of moderate intensity exercise weekly for heart health, and weights twice weekly for bone health   Aim for 7-9 hours of sleep daily   When it comes to diets, agreement about the perfect plan isnt easy to find, even among the experts. Experts at the [facility] of [employer] developed an idea known as the Healthy Eating Plate. Just imagine a plate divided into logical, healthy portions.   The emphasis is on diet quality:   Load up on vegetables and fruits - one-half of your plate: Aim for color and variety, and remember that potatoes dont count.   Go for whole grains - one-quarter of your plate: Whole wheat, barley, wheat berries, quinoa, oats, brown rice, and foods made with them. If you want pasta, go with whole wheat pasta.   Protein power - one-quarter of your plate: Fish, chicken, beans, and nuts are all healthy, versatile protein sources. Limit red meat.   The diet, however, does go beyond the plate, offering a few other suggestions.   Use healthy plant oils, such as olive, canola, soy, corn, sunflower and peanut. Check the labels, and avoid  partially hydrogenated oil, which have unhealthy trans fats.   If youre thirsty, drink water. Coffee and tea are good in moderation, but skip sugary drinks and limit milk and dairy products to one or two daily servings.   The type of carbohydrate in the diet is more important than the amount. Some sources of carbohydrates, such as vegetables, fruits, whole grains, and beans-are healthier than others.   Finally, stay active  Signed, Thomasene Ripple, DO  02/24/2022 6:19 PM    [facility]

## 2022-02-23 NOTE — Patient Instructions (Signed)
Medication Instructions:  All cardiac medications refilled today. *If you need a refill on your cardiac medications before your next appointment, please call your pharmacy*   Lab Work: None   Testing/Procedures: None   Follow-Up: At [facility], you and your health needs are our priority.  As part of our continuing mission to provide you with exceptional heart care, we have created designated Provider Care Teams.  These Care Teams include your primary Cardiologist (physician) and Advanced Practice Providers (APPs -  Physician Assistants and Nurse Practitioners) who all work together to provide you with the care you need, when you need it.  We recommend signing up for the patient portal called "MyChart".  Sign up information is provided on this After Visit Summary.  MyChart is used to connect with patients for Virtual Visits (Telemedicine).  Patients are able to view lab/test results, encounter notes, upcoming appointments, etc.  Non-urgent messages can be sent to your provider as well.   To learn more about what you can do with MyChart, go to [email or web address].    Your next appointment:   1 year(s)  Provider:   Berniece Salines, DO

## 2022-02-23 NOTE — Telephone Encounter (Signed)
Prescription refill request for Xarelto received.  Indication:afib Last office visit:1/24 Weight:76  kg Age:67 Scr:1.2 CrCl:63.33  ml/min  Prescription refilled

## 2022-04-18 ENCOUNTER — Encounter: Payer: Self-pay | Admitting: Neurology

## 2022-10-22 ENCOUNTER — Encounter: Payer: Self-pay | Admitting: Cardiology

## 2022-11-14 ENCOUNTER — Ambulatory Visit: Payer: Medicare Other | Admitting: Neurology

## 2022-11-26 ENCOUNTER — Encounter: Payer: Self-pay | Admitting: Neurology

## 2022-11-26 ENCOUNTER — Ambulatory Visit: Payer: Medicare Other | Admitting: Neurology

## 2022-11-26 VITALS — BP 165/100 | HR 78 | Ht 63.0 in | Wt 177.0 lb

## 2022-11-26 DIAGNOSIS — Z8673 Personal history of transient ischemic attack (TIA), and cerebral infarction without residual deficits: Secondary | ICD-10-CM

## 2022-11-26 DIAGNOSIS — I482 Chronic atrial fibrillation, unspecified: Secondary | ICD-10-CM

## 2022-11-26 NOTE — Patient Instructions (Signed)
I had a long d/w patient using a spanish language interpreter about his recent stroke, atrial fibrillation,risk for recurrent stroke/TIAs, personally independently reviewed imaging studies and stroke evaluation results and answered questions.Continue Xarelto (rivaroxaban) 20 mg once daily  for secondary stroke prevention and maintain strict control of hypertension with blood pressure goal below 130/90, diabetes with hemoglobin A1c goal below 6.5% and lipids with LDL cholesterol goal below 70 mg/dL. I also advised the patient to eat a healthy diet with plenty of whole grains, cereals, fruits and vegetables, exercise regularly and maintain ideal body weight Check f/u lipid panel and HbA1c today.Check screening carotid ultrasound.Followup in the future with me in 1 year or call earlier if needed.

## 2022-11-26 NOTE — Progress Notes (Signed)
[facility] [address] [place]. [place] [address] [phone]       OFFICE FOLLOW-UP NOTE  Blake Carey Date of Birth:  1955/10/01 Medical Record Number:  [id number]   HPI: Blake Carey is a 67 year old Hispanic male seen today for initial office follow-up visit following hospital admission for stroke in May 2021.  Is accompanied by his daughter Blake Carey who translates for him today with his permission.  I personally reviewed history of presenting illness, electronic medical records and imaging films in PACS. Blake Carey is an 67 y.o. male with a history of HTN and a-fib, on Eliquis, who presents via EMS as a Code Stroke after acute onset of right hemiplegia and aphasia while eating at a restaurant this evening. Time of onset is the same as LKW: 1845. He was witnessed by family to become non-verbal and not acting right, as well as unable to follow instructions. He has no prior history of stroke. He was not a tPA candidate as he was on eliquis. Found to have a L M1 LVO. Sent to IR for mechanical thrombectomy. With TICI 3 revascularization by Dr Blake Ave.   Patient did very well and was kept in the ICU and extubated and had no physical deficits on exam.  CT angiogram is also shown a 5 mm anterior communicating artery aneurysm which was incidental.  MRI scan of the brain showed tiny scattered acute infarcts in the left MCA distribution mostly in the basal ganglia and punctate acute high right frontal incidental infarct.  MRA showed patent left M1 segment after treatment.  2D echo showed normal ejection fraction.  LDL cholesterol was elevated at 143 mg percent and hemoglobin A1c at 6.0.  Urine drug screen was negative.  Patient was on Eliquis prior to admission he was initially treated with IV heparin with plans to switch to warfarin however since patient was discharged over the weekend there was lack of ability to check his INR and to contact primary care physician to do this so  ends transition of care pharmacy arrange instead to give the patient 30-day free supply of Xarelto and patient was discharged on this with plans to switch to warfarin after being followed by primary care physician who would also follow INR.  Patient also returned subsequently and saw Dr. Whitney Carey who acquired is asymptomatic a common rhythm on 08/05/2019 and procedure was uneventful.  He was started on aspirin 81 in addition to the Xarelto.  Patient is tolerating both medications well with minor bruising but no bleeding.  He is recovered back to his baseline and has no complaints today.  Blood pressures well controlled at home though today it is slightly elevated in the office at 147/91.  Is tolerating Lipitor well without muscle aches and pains.  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  He has not yet seen his primary care physician to set up switching Xarelto to warfarin and have regular INR checks.  He states he cannot afford the co-pay for his Xarelto Update 06/16/2020: Patient is seen today for follow-up after last visit nearly 10 months ago.  Is accompanied by Spanish language interpreter Blake Carey was present throughout this visit.  Patient states he is doing well.  He said no recurrent stroke or TIA symptoms.  He has had no new neurological or other health problems.  He had trouble affording Eliquis since his cardiologist had switched him to Xarelto 20 mg daily which he cannot afford  the co-pay.  Is tolerating it well with only minor bruising and no bleeding.  Patient was advised at last visit to follow-up with neuro interventional radiologist to the follow-up on his cerebral aneurysms but unclear reason that has not happened.  He states his blood pressure is under good control on Norvasc and today it is borderline in the office at 149/84.  He remains on Lipitor 40 mg daily she is tolerating well without any side effects.  He has no complaints today Update 11/13/2021 : He  returns for follow-up after last visit almost a year and a half ago.  He is accompanied by [place] language interpreter Blake Carey.  Patient states he is doing well.  He has had no recurrent stroke or TIA symptoms now for 2 and half years.  He has no residual stroke deficits.  He remains on Xarelto for his A-fib which is tolerating well without significant bleeding or bruising.  His blood pressure is well controlled with systolics in the 130-140s range at home is 148/77 in the office. Lipitor 40 mg daily which is tolerating well without muscle aches and pains.  It is unclear when his last lipid profile was checked.  He has no complaints today.Marland Kitchen  He has not kept any follow-up appointment for his cerebral aneurysm with neuro interventional radiologist. Update 11/26/2022 : He returns for follow-up after last visit 1 year ago.  He is accompanied by Spanish language interpreter who was present throughout this visit.  He continues to do well.  He has had no recurrent TIA or strokes in comes for 3 and half years.  He has no residual deficits from his stroke.  He remains independent in all activities of daily living.  He is tolerating Xarelto well without bleeding bruising or other side effects.  States his blood pressure is under very good control.  He is tolerating Lipitor well without muscle aches and pains.  Lab work at last visit on 11/13/2021 had shown LDL cholesterol to be optimal at 62 mg percent and hemoglobin A1c was 5.8.  He denies any headaches or any other neurological symptoms.  He has no complaints today. ROS:   14 system review of systems is positive for no complaints today  PMH:  Past Medical History:  Diagnosis Date   Acute lower UTI 06/24/2019   AKI (acute kidney injury) (HCC)    05/2019 (contrast nephropathy versus obstructive urinary retention with UTI)   Anemia    low iron   Aneurysm of anterior communicating artery 06/24/2019   Atrial fibrillation (HCC)    Cerebral aneurysm    ACOM aneurysm  06/21/19   Dyslipidemia 12/03/2018   Essential hypertension 12/03/2018   History of CVA (cerebrovascular accident) 07/10/2019   HTN (hypertension)    Leukocytosis 06/24/2019   Mixed hyperlipidemia 07/10/2019   Obesity (BMI 30-39.9) 12/03/2018   Paroxysmal atrial fibrillation (HCC) 12/03/2018   Persistent atrial fibrillation (HCC) 07/10/2019   Pre-procedure lab exam 12/31/2018   Stroke (cerebrum) (HCC) 06/20/2019   Stroke (HCC)    06/20/19    Social History:  Social History   Socioeconomic History   Marital status: Unknown    Spouse name: Not on file   Number of children: Not on file   Years of education: Not on file   Highest education level: Not on file  Occupational History   Occupation: part time  Tobacco Use   Smoking status: Never   Smokeless tobacco: Never  Vaping Use   Vaping status: Never Used  Substance  and Sexual Activity   Alcohol use: Not Currently    Comment: sober for over 40 years   Drug use: Never   Sexual activity: Not on file  Other Topics Concern   Not on file  Social History Narrative   Lives with wife   Right Handed    Drinks no caffeine   Social Determinants of Health   Financial Resource Strain: Not on file  Food Insecurity: Not on file  Transportation Needs: Not on file  Physical Activity: Not on file  Stress: Not on file  Social Connections: Not on file  Intimate Partner Violence: Not on file    Medications:   Current Outpatient Medications on File Prior to Visit  Medication Sig Dispense Refill   amLODipine (NORVASC) 5 MG tablet Take 1 tablet (5 mg total) by mouth daily. 90 tablet 3   atorvastatin (LIPITOR) 40 MG tablet Take 1 tablet (40 mg total) by mouth daily at 6 PM. 90 tablet 3   folic acid (FOLVITE) 1 MG tablet Take 1 mg by mouth daily.     metoprolol succinate (TOPROL XL) 25 MG 24 hr tablet Take 0.5 tablets (12.5 mg total) by mouth daily. 45 tablet 3   Omega-3 Fatty Acids (FISH OIL) 300 MG CAPS Take by mouth.     rivaroxaban (XARELTO)  20 MG TABS tablet Take 1 tablet (20 mg total) by mouth daily with supper. Take with the biggest meal of the day at the same time every day. Do not miss a dose. Set your alarm to remind you to take your Xarelto. 90 tablet 3   No current facility-administered medications on file prior to visit.    Allergies:   Allergies  Allergen Reactions   Contrast Media [Iodinated Contrast Media] Other (See Comments)    Affects Kidney Function     Physical Exam General: Frail middle-aged Hispanic male, seated, in no evident distress Head: head normocephalic and atraumatic.  Neck: supple with no carotid or supraclavicular bruits Cardiovascular: regular rate and rhythm, no murmurs Musculoskeletal: no deformity Skin:  no rash/petichiae Vascular:  Normal pulses all extremities Vitals:   11/26/22 1519 11/26/22 1532  BP: (!) 174/91 (!) 165/100  Pulse: 77 78   Neurologic Exam Mental Status: Awake and fully alert. Oriented to place and time. Recent and remote memory intact. Attention span, concentration and fund of knowledge appropriate. Mood and affect appropriate.  Cranial Nerves: Fundoscopic exam not done today.  Pupils equal, briskly reactive to light. Extraocular movements full without nystagmus. Visual fields full to confrontation. Hearing intact. Facial sensation intact. Face, tongue, palate moves normally and symmetrically.  Motor: Normal bulk and tone. Normal strength in all tested extremity muscles. Sensory.: intact to touch ,pinprick .position and vibratory sensation.  Coordination: Rapid alternating movements normal in all extremities. Finger-to-nose and heel-to-shin performed accurately bilaterally. Gait and Station: Arises from chair without difficulty. Stance is normal. Gait demonstrates normal stride length and balance . Able to heel, toe and tandem walk without difficulty.  Reflexes: 1+ and symmetric. Toes downgoing.      ASSESSMENT: 67 year old Hispanic male with left MCA infarct in  May 2021 due to left MCA occlusion s/p mechanical thrombectomy due to atrial fibrillation.  Also incidental 5 mm anterior communicating artery aneurysm s/p endovascular coiling treatment on 08/05/2019.  He appears stable from neurovascular standpoint     PLAN: I had a long d/w patient using a spanish language interpreter about his recent stroke, atrial fibrillation,risk for recurrent stroke/TIAs, personally independently reviewed imaging studies  and stroke evaluation results and answered questions.Continue Xarelto (rivaroxaban) 20 mg once daily  for secondary stroke prevention and maintain strict control of hypertension with blood pressure goal below 130/90, diabetes with hemoglobin A1c goal below 6.5% and lipids with LDL cholesterol goal below 70 mg/dL. I also advised the patient to eat a healthy diet with plenty of whole grains, cereals, fruits and vegetables, exercise regularly and maintain ideal body weight Check f/u lipid panel and HbA1c today.Check screening carotid ultrasound.Followup in the future with me in 1 year or call earlier if needed. Greater than 50% of time during this 35 minute visit was spent on counseling,explanation of diagnosis embolic stroke and brain aneurysm, planning of further management, discussion with patient and family and coordination of care Delia Heady, MD  [facility] [address] [place], [place] [address]  Phone [phone] Fax [phone]: This document was prepared with digital dictation and possible smart phrase technology. Any transcriptional errors that result from this process are unintentional

## 2022-11-27 LAB — LIPID PANEL
Chol/HDL Ratio: 3.1 ratio (ref 0.0–5.0)
Cholesterol, Total: 162 mg/dL (ref 100–199)
HDL: 53 mg/dL (ref >39)
LDL Chol Calc (NIH): 92 mg/dL (ref 0–99)
Triglycerides: 92 mg/dL (ref 0–149)
VLDL Cholesterol Cal: 17 mg/dL (ref 5–40)

## 2022-11-27 LAB — HEMOGLOBIN A1C
Est. average glucose Bld gHb Est-mCnc: 123 mg/dL
Hgb A1c MFr Bld: 5.9 % — ABNORMAL HIGH (ref 4.8–5.6)

## 2022-12-02 NOTE — Progress Notes (Signed)
Kindly inform the patient that screening test for diabetes was satisfactory. Cholesterol profile was mostly okay except bad cholesterol was slightly high. Kindly increase Lipitor dose to 80 mg daily and contact primary care physician for more aggressive cholesterol control

## 2022-12-02 NOTE — Progress Notes (Signed)
Kindly inform the patient that screening test for diabetes was satisfactory.  Cholesterol profile was mostly okay except bad cholesterol was slightly high.  Kindly contact primary care physician for more aggressive cholesterol control

## 2022-12-05 ENCOUNTER — Telehealth: Payer: Self-pay | Admitting: Anesthesiology

## 2022-12-05 NOTE — Telephone Encounter (Signed)
-----   Message from Delia Heady sent at 12/02/2022  3:35 PM EST ----- Kindly inform the patient that screening test for diabetes was satisfactory. Cholesterol profile was mostly okay except bad cholesterol was slightly high. Kindly increase Lipitor dose to 80 mg daily and contact primary care physician for more aggressive cholesterol control

## 2022-12-13 ENCOUNTER — Ambulatory Visit (HOSPITAL_COMMUNITY): Payer: Medicare Other

## 2022-12-21 ENCOUNTER — Ambulatory Visit (HOSPITAL_COMMUNITY)
Admission: RE | Admit: 2022-12-21 | Discharge: 2022-12-21 | Disposition: A | Discharge status: Home / Self Care | Payer: Medicare Other | Source: Ambulatory Visit | Attending: Neurology | Admitting: Neurology

## 2022-12-21 DIAGNOSIS — Z8673 Personal history of transient ischemic attack (TIA), and cerebral infarction without residual deficits: Secondary | ICD-10-CM | POA: Diagnosis present

## 2022-12-30 NOTE — Progress Notes (Signed)
Kindly inform the patient that carotid ultrasound study shows no significant narrowing of either carotid arteries in the neck

## 2023-01-01 NOTE — Telephone Encounter (Signed)
Called the patient via interpretor line. Attempted the patient's number and was unable to LVM. Called the daughter's number on the DPR and there was no answer. Interpretor LVM with the information of the results and instructed them to call back with any questions.

## 2023-02-21 ENCOUNTER — Encounter: Payer: Self-pay | Admitting: Cardiology

## 2023-02-21 MED ORDER — METOPROLOL SUCCINATE ER 25 MG PO TB24: 12.5000 mg | ORAL_TABLET | Freq: Every day | ORAL | 0 refills | Status: DC

## 2023-03-22 ENCOUNTER — Other Ambulatory Visit: Payer: Self-pay | Admitting: Cardiology

## 2023-03-28 ENCOUNTER — Ambulatory Visit: Payer: Medicare Other | Admitting: Cardiology

## 2023-05-03 ENCOUNTER — Encounter: Payer: Self-pay | Admitting: Cardiology

## 2023-05-03 ENCOUNTER — Ambulatory Visit: Payer: Medicare Other | Attending: Cardiology | Admitting: Cardiology

## 2023-05-03 VITALS — BP 156/92 | HR 64 | Ht 62.0 in | Wt 171.8 lb

## 2023-05-03 DIAGNOSIS — I480 Paroxysmal atrial fibrillation: Secondary | ICD-10-CM

## 2023-05-03 DIAGNOSIS — I10 Essential (primary) hypertension: Secondary | ICD-10-CM | POA: Diagnosis not present

## 2023-05-03 DIAGNOSIS — Z8673 Personal history of transient ischemic attack (TIA), and cerebral infarction without residual deficits: Secondary | ICD-10-CM

## 2023-05-03 MED ORDER — CARVEDILOL 12.5 MG PO TABS: 12.5000 mg | ORAL_TABLET | Freq: Two times a day (BID) | ORAL | 3 refills | Status: AC

## 2023-05-03 NOTE — Patient Instructions (Addendum)
 Medication Instructions:  Your physician has recommended you make the following change in your medication:  STOP: Toprol-XL (Metoprolol succinate)  START: Coreg 12.5 mg twice daily *If you need a refill on your cardiac medications before your next appointment, please call your pharmacy*  Follow-Up: At [facility], you and your health needs are our priority.  As part of our continuing mission to provide you with exceptional heart care, our providers are all part of one team.  This team includes your primary Cardiologist (physician) and Advanced Practice Providers or APPs (Physician Assistants and Nurse Practitioners) who all work together to provide you with the care you need, when you need it.  Your next appointment:   24 week(s)  Provider:   Thomasene Ripple, DO or APP   Other Instructions:   1st Floor: - Lobby - Registration  - Pharmacy  - Lab - Cafe  2nd Floor: - PV Lab - Diagnostic Testing (echo, CT, nuclear med)  3rd Floor: - Vacant  4th Floor: - TCTS (cardiothoracic surgery) - AFib Clinic - Structural Heart Clinic - Vascular Surgery  - Vascular Ultrasound  5th Floor: - HeartCare Cardiology (general and EP) - Clinical Pharmacy for coumadin, hypertension, lipid, weight-loss medications, and med management appointments    Valet parking services will be available as well.

## 2023-05-03 NOTE — Progress Notes (Signed)
 Cardiology Office Note:    Date:  05/03/2023   ID:  Blake Carey, DOB [birth year], MRN [id number]  PCP:  Erskine Emery, NP  Cardiologist:  Thomasene Ripple, DO  Electrophysiologist:  None   Referring MD: Erskine Emery, NP   " I am doing well"  History of Present Illness:    Blake Carey is a 68 y.o. male with a hx of atrial fibrillation previously on eliquis and had a recent left MCA embolic stroke status post TICI3 reperfusion (IR mechanical thrombectomy), therefore he was switched to Xarelto, saccular aneurysm of the ACOM status post coiling, Hypertension, Hyperlipidemia.   I did see the patient on 07/10/2019 and at that time he was status post hospitalization for his CVA.  Atthattime he appeared to be doing well from a cardiovascular standpoint.  We had given you information for medical assistance for his Xarelto.  His blood pressure slightly high we discussed cutting back salt he was in good spirits and was planning his coiling for saccular aneurysm repair.   I saw the patient in September 2021, at that time he appeared to be doing well but his blood pressure slightly elevated.  We discussed cutting back salt.   I saw the patient on January 13, 2020 at that time he was doing well from a cardiovascular standpoint.  We talked about his medications and the fact that he needs to be compliant with it.  He offers no complaints at this time.  His last visit was on 11/17/2020 at that time I started patient on Toprol-XL given his paroxysmal atrial fibrillation.  He is here today for follow-up visit.  At his last visit on February 23, 2022 he was doing well from a cardiovascular standpoint.  He was in atrial fibrillation at that time I suggest that we attempt cardioversion but he had declined.  He is here today for a follow-up visit.  Past Medical History:  Diagnosis Date   Acute lower UTI 06/24/2019   AKI (acute kidney injury) (HCC)    05/2019 (contrast nephropathy versus obstructive urinary  retention with UTI)   Anemia    low iron   Aneurysm of anterior communicating artery 06/24/2019   Atrial fibrillation (HCC)    Cerebral aneurysm    ACOM aneurysm 06/21/19   Dyslipidemia 12/03/2018   Essential hypertension 12/03/2018   History of CVA (cerebrovascular accident) 07/10/2019   HTN (hypertension)    Leukocytosis 06/24/2019   Mixed hyperlipidemia 07/10/2019   Obesity (BMI 30-39.9) 12/03/2018   Paroxysmal atrial fibrillation (HCC) 12/03/2018   Persistent atrial fibrillation (HCC) 07/10/2019   Pre-procedure lab exam 12/31/2018   Stroke (cerebrum) (HCC) 06/20/2019   Stroke (HCC)    06/20/19    Past Surgical History:  Procedure Laterality Date   APPENDECTOMY     IR 3D INDEPENDENT WKST  08/05/2019   IR ANGIOGRAM FOLLOW UP STUDY  08/05/2019   IR CT HEAD LTD  06/20/2019   IR PERCUTANEOUS ART THROMBECTOMY/INFUSION INTRACRANIAL INC DIAG ANGIO  06/20/2019   IR TRANSCATH/EMBOLIZ  08/05/2019       IR TRANSCATH/EMBOLIZ  08/05/2019   IR US GUIDE VASC ACCESS RIGHT  06/20/2019   IR US GUIDE VASC ACCESS RIGHT  08/05/2019   KNEE SURGERY Right    RADIOLOGY WITH ANESTHESIA N/A 06/20/2019   Procedure: IR WITH ANESTHESIA;  Surgeon: Julieanne Cotton, MD;  Location: [facility] OR;  Service: Radiology;  Laterality: N/A;   RADIOLOGY WITH ANESTHESIA N/A 08/05/2019   Procedure: ANEURYSM EMBOLIZATION;  Surgeon: Tommie Sams,  Jerilynn Mages, MD;  Location: [facility] OR;  Service: Radiology;  Laterality: N/A;    Current Medications: Current Meds  Medication Sig   amLODipine (NORVASC) 5 MG tablet Take 1 tablet (5 mg total) by mouth daily.   atorvastatin (LIPITOR) 40 MG tablet TAKE 1 TABLET BY MOUTH ONCE DAILY AT  6PM   carvedilol (COREG) 12.5 MG tablet Take 1 tablet (12.5 mg total) by mouth 2 (two) times daily.   folic acid (FOLVITE) 1 MG tablet Take 1 mg by mouth daily.   Omega-3 Fatty Acids (FISH OIL) 300 MG CAPS Take by mouth.   rivaroxaban (XARELTO) 20 MG TABS tablet Take 1 tablet (20 mg total) by mouth daily with supper.  Take with the biggest meal of the day at the same time every day. Do not miss a dose. Set your alarm to remind you to take your Xarelto.   [DISCONTINUED] metoprolol succinate (TOPROL XL) 25 MG 24 hr tablet Take 0.5 tablets (12.5 mg total) by mouth daily.     Allergies:   Contrast media [iodinated contrast media]   Social History   Socioeconomic History   Marital status: Unknown    Spouse name: Not on file   Number of children: Not on file   Years of education: Not on file   Highest education level: Not on file  Occupational History   Occupation: part time  Tobacco Use   Smoking status: Never   Smokeless tobacco: Never  Vaping Use   Vaping status: Never Used  Substance and Sexual Activity   Alcohol use: Not Currently    Comment: sober for over 40 years   Drug use: Never   Sexual activity: Not on file  Other Topics Concern   Not on file  Social History Narrative   Lives with wife   Right Handed    Drinks no caffeine   Social Drivers of Corporate investment banker Strain: Not on file  Food Insecurity: Not on file  Transportation Needs: Not on file  Physical Activity: Not on file  Stress: Not on file  Social Connections: Not on file     Family History: The patient's family history includes Prostate cancer in his father.  ROS:   Review of Systems  Constitution: Negative for decreased appetite, fever and weight gain.  HENT: Negative for congestion, ear discharge, hoarse voice and sore throat.   Eyes: Negative for discharge, redness, vision loss in right eye and visual halos.  Cardiovascular: Negative for chest pain, dyspnea on exertion, leg swelling, orthopnea and palpitations.  Respiratory: Negative for cough, hemoptysis, shortness of breath and snoring.   Endocrine: Negative for heat intolerance and polyphagia.  Hematologic/Lymphatic: Negative for bleeding problem. Does not bruise/bleed easily.  Skin: Negative for flushing, nail changes, rash and suspicious lesions.   Musculoskeletal: Negative for arthritis, joint pain, muscle cramps, myalgias, neck pain and stiffness.  Gastrointestinal: Negative for abdominal pain, bowel incontinence, diarrhea and excessive appetite.  Genitourinary: Negative for decreased libido, genital sores and incomplete emptying.  Neurological: Negative for brief paralysis, focal weakness, headaches and loss of balance.  Psychiatric/Behavioral: Negative for altered mental status, depression and suicidal ideas.  Allergic/Immunologic: Negative for HIV exposure and persistent infections.    EKGs/Labs/Other Studies Reviewed:    The following studies were reviewed today:   EKG: Atrial fibrillation with controlled ventricular rate, heart rate 64 beats minute  Recent Labs: No results found for requested labs within last 365 days.  Recent Lipid Panel    Component Value Date/Time  CHOL 162 11/26/2022 1553   TRIG 92 11/26/2022 1553   HDL 53 11/26/2022 1553   CHOLHDL 3.1 11/26/2022 1553   CHOLHDL 4.2 06/21/2019 0236   VLDL 19 06/21/2019 0236   LDLCALC 92 11/26/2022 1553    Physical Exam:    VS:  BP (!) 156/92   Pulse 64   Ht 5\' 2"  (1.575 m)   Wt 171 lb 12.8 oz (77.9 kg)   SpO2 97%   BMI 31.42 kg/m     Wt Readings from Last 3 Encounters:  05/03/23 171 lb 12.8 oz (77.9 kg)  11/26/22 177 lb (80.3 kg)  02/23/22 167 lb 8 oz (76 kg)     GEN: Well nourished, well developed in no acute distress HEENT: Normal NECK: No JVD; No carotid bruits LYMPHATICS: No lymphadenopathy CARDIAC: S1S2 noted,RRR, no murmurs, rubs, gallops RESPIRATORY:  Clear to auscultation without rales, wheezing or rhonchi  ABDOMEN: Soft, non-tender, non-distended, +bowel sounds, no guarding. EXTREMITIES: No edema, No cyanosis, no clubbing MUSCULOSKELETAL:  No deformity  SKIN: Warm and dry NEUROLOGIC:  Alert and oriented x 3, non-focal PSYCHIATRIC:  Normal affect, good insight  ASSESSMENT:    1. Paroxysmal atrial fibrillation (HCC)   2. Essential  hypertension   3. History of CVA (cerebrovascular accident)     PLAN:    He is hypertensive in the office today.  We talked about adjusting his medication looking back in the record on October 28 he was also hypertensive with systolic 174/91.  We need to adjust his antihypertensive medication.  Go to stop Toprol XL.  Start the patient on carvedilol 12.5 mg twice daily.  He needs to follow-up with the medication change to check on his blood pressure if the availability is not in my schedule he can see an APP or one of our pharmacist team member.  He is seen in atrial fibrillation today but I am hesitant to proceed with rhythm control given the fact that he has not had some of his medication and is requesting refills.  During his follow-up we will get an EKG and if he is in atrial fibrillation then we will attempt DCCV.  Hx of CVA -stable.   The patient is in agreement with the above plan. The patient left the office in stable condition.  The patient will follow up in  24 months or sooner if needed.   Medication Adjustments/Labs and Tests Ordered: Current medicines are reviewed at length with the patient today.  Concerns regarding medicines are outlined above.  Orders Placed This Encounter  Procedures   EKG 12-Lead    Meds ordered this encounter  Medications   carvedilol (COREG) 12.5 MG tablet    Sig: Take 1 tablet (12.5 mg total) by mouth 2 (two) times daily.    Dispense:  180 tablet    Refill:  3    Patient Instructions  Medication Instructions:  Your physician has recommended you make the following change in your medication:  STOP: Toprol-XL (Metoprolol succinate)  START: Coreg 12.5 mg twice daily *If you need a refill on your cardiac medications before your next appointment, please call your pharmacy*  Follow-Up: At [facility], you and your health needs are our priority.  As part of our continuing mission to provide you with exceptional heart care, our providers  are all part of one team.  This team includes your primary Cardiologist (physician) and Advanced Practice Providers or APPs (Physician Assistants and Nurse Practitioners) who all work together to provide you with  the care you need, when you need it.  Your next appointment:   24 week(s)  Provider:   Thomasene Ripple, DO or APP   Other Instructions:   1st Floor: - Lobby - Registration  - Pharmacy  - Lab - Cafe  2nd Floor: - PV Lab - Diagnostic Testing (echo, CT, nuclear med)  3rd Floor: - Vacant  4th Floor: - TCTS (cardiothoracic surgery) - AFib Clinic - Structural Heart Clinic - Vascular Surgery  - Vascular Ultrasound  5th Floor: - [facility] (general and EP) - Clinical Pharmacy for coumadin, hypertension, lipid, weight-loss medications, and med management appointments    Valet parking services will be available as well.      Adopting a Healthy Lifestyle.  Know what a healthy weight is for you (roughly BMI <25) and aim to maintain this   Aim for 7+ servings of fruits and vegetables daily   65-80+ fluid ounces of water or unsweet tea for healthy kidneys   Limit to max 1 drink of alcohol per day; avoid smoking/tobacco   Limit animal fats in diet for cholesterol and heart health - choose grass fed whenever available   Avoid highly processed foods, and foods high in saturated/trans fats   Aim for low stress - take time to unwind and care for your mental health   Aim for 150 min of moderate intensity exercise weekly for heart health, and weights twice weekly for bone health   Aim for 7-9 hours of sleep daily   When it comes to diets, agreement about the perfect plan isnt easy to find, even among the experts. Experts at the [facility] [employer] developed an idea known as the Healthy Eating Plate. Just imagine a plate divided into logical, healthy portions.   The emphasis is on diet quality:   Load up on vegetables and fruits - one-half of  your plate: Aim for color and variety, and remember that potatoes dont count.   Go for whole grains - one-quarter of your plate: Whole wheat, barley, wheat berries, quinoa, oats, brown rice, and foods made with them. If you want pasta, go with whole wheat pasta.   Protein power - one-quarter of your plate: Fish, chicken, beans, and nuts are all healthy, versatile protein sources. Limit red meat.   The diet, however, does go beyond the plate, offering a few other suggestions.   Use healthy plant oils, such as olive, canola, soy, corn, sunflower and peanut. Check the labels, and avoid partially hydrogenated oil, which have unhealthy trans fats.   If youre thirsty, drink water. Coffee and tea are good in moderation, but skip sugary drinks and limit milk and dairy products to one or two daily servings.   The type of carbohydrate in the diet is more important than the amount. Some sources of carbohydrates, such as vegetables, fruits, whole grains, and beans-are healthier than others.   Finally, stay active  Signed, Thomasene Ripple, DO  05/03/2023 8:51 AM    [facility]

## 2023-06-04 ENCOUNTER — Encounter: Payer: Self-pay | Admitting: Cardiology

## 2023-06-04 NOTE — Progress Notes (Unsigned)
 Cardiology Office Note:  .   Date:  06/05/2023  ID:  Blake Carey, DOB [birth year], MRN [id number] PCP: Marce Sensing, NP  [facility] Providers Cardiologist:  Jerryl Morin, DO    History of Present Illness: .   Blake Carey is a 68 y.o. male with past medical history of atrial fibrillation, history of stroke, history of cerebral aneurysm, hypertension, dyslipidemia.  12/21/2022 carotid duplex Mild bilateral carotid artery stenosis 04/01/2019 echo EF 60 to 65%, diastolic parameters were indeterminate, LA moderately dilated, trivial MR  He established with [facility] in 2021 following hospitalization for stroke.  It appears there have been some issues with medication adherence in the past.  Most recently he was evaluated on 05/03/2023, his blood pressure was elevated, his Toprol  was stopped and he was started on Coreg , he is also noted to be in atrial fibrillation however rhythm control was not pursued with concern that he was not appropriately anticoagulated secondary to adherence issues.  He presents today accompanied by the medical interpreter and his daughter for follow-up.  He was apparently evaluated in urgent care for hypertensive crisis with elevated blood pressure reading at home greater than 200 systolic however by the time he arrived to urgent care his blood pressure was controlled.  He has been taking his medications as prescribed without any missed doses.  There was some discussion during his previous visit with Dr. Emmette Harms about possibly pursuing cardioversion, however he does not recall this and states he is not interested in this as he is feeling good. We discussed repeating an echocardiogram, to assess for size of atria, however he politely declines. He saw his PCP yesterday and had lab work, will request from their office. He denies chest pain, palpitations, dyspnea, hematochezia, materia, hemoptysis, pnd, orthopnea, n, v, dizziness, syncope, edema, weight gain, or early satiety.    ROS: Review of Systems  All other systems reviewed and are negative.    Studies Reviewed: .        Cardiac Studies & Procedures   ______________________________________________________________________________________________     ECHOCARDIOGRAM  ECHOCARDIOGRAM COMPLETE 04/01/2019  Narrative ECHOCARDIOGRAM REPORT    Patient Name:   KHRIZ BAILIE Date of Exam: 04/01/2019 Medical Rec #:  [id number]      Height:       63.0 in Accession #:    [id number]     Weight:       176.0 lb Date of Birth:  1955/02/12      BSA:          1.831 m Patient Age:    63 years       BP:           172/92 mmHg Patient Gender: M              HR:           60 bpm. Exam Location:  [facility]  Procedure: 2D Echo  Indications:    Atrial Fibrillation 427.31 / I48.91  History:        Patient has no prior history of Echocardiogram examinations. Risk Factors:Hypertension and Dyslipidemia.  Sonographer:    Kristen Petri Referring Phys: [id number] KARDIE TOBB  IMPRESSIONS   1. Left ventricular ejection fraction, by estimation, is 60 to 65%. The left ventricle has normal function. The left ventricle has no regional wall motion abnormalities. Left ventricular diastolic parameters are indeterminate. 2. Right ventricular systolic function is normal. The right ventricular size is normal. There is normal pulmonary artery systolic pressure. 3. Left atrial  size was moderately dilated. 4. The mitral valve is normal in structure and function. Trivial mitral valve regurgitation. No evidence of mitral stenosis. 5. The aortic valve is normal in structure and function. Aortic valve regurgitation is mild. No aortic stenosis is present. 6. The inferior vena cava is normal in size with greater than 50% respiratory variability, suggesting right atrial pressure of 3 mmHg.  FINDINGS Left Ventricle: Left ventricular ejection fraction, by estimation, is 60 to 65%. The left ventricle has normal function. The left ventricle has no  regional wall motion abnormalities. The left ventricular internal cavity size was normal in size. There is no left ventricular hypertrophy. Left ventricular diastolic parameters are indeterminate.  Right Ventricle: The right ventricular size is normal. No increase in right ventricular wall thickness. Right ventricular systolic function is normal. There is normal pulmonary artery systolic pressure. The tricuspid regurgitant velocity is 2.38 m/s, and with an assumed right atrial pressure of 3 mmHg, the estimated right ventricular systolic pressure is 25.7 mmHg.  Left Atrium: Left atrial size was moderately dilated.  Right Atrium: Right atrial size was normal in size.  Pericardium: There is no evidence of pericardial effusion.  Mitral Valve: The mitral valve is normal in structure and function. Normal mobility of the mitral valve leaflets. Trivial mitral valve regurgitation. No evidence of mitral valve stenosis.  Tricuspid Valve: The tricuspid valve is normal in structure. Tricuspid valve regurgitation is not demonstrated. No evidence of tricuspid stenosis.  Aortic Valve: The aortic valve is normal in structure and function. Aortic valve regurgitation is mild. Aortic regurgitation PHT measures 813 msec. No aortic stenosis is present.  Pulmonic Valve: The pulmonic valve was normal in structure. Pulmonic valve regurgitation is not visualized. No evidence of pulmonic stenosis.  Aorta: The aortic root is normal in size and structure.  Venous: The inferior vena cava is normal in size with greater than 50% respiratory variability, suggesting right atrial pressure of 3 mmHg.  IAS/Shunts: No atrial level shunt detected by color flow Doppler.   LEFT VENTRICLE PLAX 2D LVIDd:         4.95 cm  Diastology LVIDs:         3.50 cm  LV e' lateral:   16.00 cm/s LV PW:         1.10 cm  LV E/e' lateral: 7.0 LV IVS:        1.05 cm  LV e' medial:    7.62 cm/s LVOT diam:     1.80 cm  LV E/e' medial:  14.7 LV  SV:         74 LV SV Index:   40 LVOT Area:     2.54 cm   RIGHT VENTRICLE            IVC RV S prime:     7.18 cm/s  IVC diam: 1.30 cm TAPSE (M-mode): 2.3 cm  LEFT ATRIUM             Index       RIGHT ATRIUM           Index LA diam:        5.40 cm 2.95 cm/m  RA Area:     19.70 cm LA Vol (A2C):   91.3 ml 49.85 ml/m RA Volume:   47.90 ml  26.15 ml/m LA Vol (A4C):   81.4 ml 44.47 ml/m LA Biplane Vol: 89.5 ml 48.87 ml/m AORTIC VALVE LVOT Vmax:   124.00 cm/s LVOT Vmean:  89.300 cm/s LVOT VTI:  0.289 m AI PHT:      813 msec  AORTA Ao Root diam: 3.10 cm Ao Asc diam:  3.70 cm  MITRAL VALVE                TRICUSPID VALVE MV Area (PHT): 3.42 cm     TR Peak grad:   22.7 mmHg MV Decel Time: 222 msec     TR Vmax:        238.00 cm/s MV E velocity: 112.00 cm/s SHUNTS Systemic VTI:  0.29 m Systemic Diam: 1.80 cm  Ralene Burger MD Electronically signed by Ralene Burger MD Signature Date/Time: 04/01/2019/4:37:20 PM    Final          ______________________________________________________________________________________________      Risk Assessment/Calculations:    CHA2DS2-VASc Score = 4   This indicates a 4.8% annual risk of stroke. The patient's score is based upon: CHF History: 0 HTN History: 1 Diabetes History: 0 Stroke History: 2 Vascular Disease History: 0 Age Score: 1 Gender Score: 0            Physical Exam:   VS:  BP 122/64   Pulse 72   Ht 5\' 3"  (1.6 m)   Wt 172 lb 6.4 oz (78.2 kg)   SpO2 97%   BMI 30.54 kg/m    Wt Readings from Last 3 Encounters:  06/05/23 172 lb 6.4 oz (78.2 kg)  05/03/23 171 lb 12.8 oz (77.9 kg)  11/26/22 177 lb (80.3 kg)    GEN: Well nourished, well developed in no acute distress NECK: No JVD; No carotid bruits CARDIAC: irregularly irregular no murmurs, rubs, gallops RESPIRATORY:  Clear to auscultation without rales, wheezing or rhonchi  ABDOMEN: Soft, non-tender, non-distended EXTREMITIES:  No edema; No deformity    ASSESSMENT AND PLAN: .   Permanent atrial fibrillation/hypercoagulable state-CHA2DS2-VASc score of 4, he is in sinus rhythm but his rate is controlled.  Continue Xarelto  20 mg once daily--recently had lab work by PCP yesterday and request results from their office, he states he is compliant with taking it every evening, denies hematochezia, hematuria, mopped assist.  Continue Coreg  12.5 mg twice daily.  Hypertension- blood pressure is controlled today 122/64, continue Coreg  12.5 mg twice daily, continue Norvasc  5 mg daily.  Will have him keep a blood pressure log for 2 weeks to make sure his blood pressure is well-controlled.  Dyslipidemia-appears to be monitored by his PCP, currently on Lipitor 40 mg daily.  History of stroke-no apparent deficits, currently on Lipitor and Xarelto .       Dispo: Blood pressure log for 2 weeks, request lab work from PCP, follow up 3 months with Dr. Emmette Harms.   Signed, Terrance Ferretti, NP

## 2023-06-05 ENCOUNTER — Encounter: Payer: Self-pay | Admitting: Cardiology

## 2023-06-05 ENCOUNTER — Ambulatory Visit: Attending: Cardiology | Admitting: Cardiology

## 2023-06-05 VITALS — BP 122/64 | HR 72 | Ht 63.0 in | Wt 172.4 lb

## 2023-06-05 DIAGNOSIS — I480 Paroxysmal atrial fibrillation: Secondary | ICD-10-CM | POA: Diagnosis not present

## 2023-06-05 DIAGNOSIS — I10 Essential (primary) hypertension: Secondary | ICD-10-CM

## 2023-06-05 DIAGNOSIS — D6859 Other primary thrombophilia: Secondary | ICD-10-CM

## 2023-06-05 DIAGNOSIS — E782 Mixed hyperlipidemia: Secondary | ICD-10-CM

## 2023-06-05 DIAGNOSIS — Z8673 Personal history of transient ischemic attack (TIA), and cerebral infarction without residual deficits: Secondary | ICD-10-CM

## 2023-06-05 NOTE — Patient Instructions (Signed)
 Medication Instructions:  Your physician recommends that you continue on your current medications as directed. Please refer to the Current Medication list given to you today.  *If you need a refill on your cardiac medications before your next appointment, please call your pharmacy*   Lab Work: None Ordered If you have labs (blood work) drawn today and your tests are completely normal, you will receive your results only by: MyChart Message (if you have MyChart) OR A paper copy in the mail If you have any lab test that is abnormal or we need to change your treatment, we will call you to review the results.   Testing/Procedures: None Ordered   Follow-Up: At [facility], you and your health needs are our priority.  As part of our continuing mission to provide you with exceptional heart care, we have created designated Provider Care Teams.  These Care Teams include your primary Cardiologist (physician) and Advanced Practice Providers (APPs -  Physician Assistants and Nurse Practitioners) who all work together to provide you with the care you need, when you need it.  We recommend signing up for the patient portal called "MyChart".  Sign up information is provided on this After Visit Summary.  MyChart is used to connect with patients for Virtual Visits (Telemedicine).  Patients are able to view lab/test results, encounter notes, upcoming appointments, etc.  Non-urgent messages can be sent to your provider as well.   To learn more about what you can do with MyChart, go to [email or web address].    Your next appointment:   3 month follow up with Dr. Emmette Harms

## 2023-07-12 ENCOUNTER — Other Ambulatory Visit: Payer: Self-pay | Admitting: Cardiology

## 2023-07-23 ENCOUNTER — Other Ambulatory Visit: Payer: Self-pay

## 2023-07-23 MED ORDER — ATORVASTATIN CALCIUM 40 MG PO TABS: 40.0000 mg | ORAL_TABLET | Freq: Every day | ORAL | 3 refills | Status: AC

## 2023-07-27 ENCOUNTER — Encounter (HOSPITAL_COMMUNITY): Payer: Self-pay | Admitting: Interventional Radiology

## 2023-10-11 ENCOUNTER — Encounter (HOSPITAL_BASED_OUTPATIENT_CLINIC_OR_DEPARTMENT_OTHER): Payer: Self-pay | Admitting: *Deleted

## 2023-10-14 ENCOUNTER — Encounter: Payer: Self-pay | Admitting: *Deleted

## 2023-10-15 ENCOUNTER — Ambulatory Visit: Attending: Cardiology | Admitting: Cardiology

## 2023-11-26 ENCOUNTER — Encounter: Payer: Self-pay | Admitting: Neurology

## 2023-11-26 ENCOUNTER — Ambulatory Visit: Payer: Medicare Other | Admitting: Neurology

## 2023-11-26 VITALS — BP 176/92 | HR 62 | Ht 65.0 in | Wt 173.0 lb

## 2023-11-26 DIAGNOSIS — Z8673 Personal history of transient ischemic attack (TIA), and cerebral infarction without residual deficits: Secondary | ICD-10-CM | POA: Diagnosis not present

## 2023-11-26 DIAGNOSIS — I671 Cerebral aneurysm, nonruptured: Secondary | ICD-10-CM

## 2023-11-26 NOTE — Patient Instructions (Signed)
 I had a long d/w patient using a spanish language interpreter about his recent stroke, atrial fibrillation,risk for recurrent stroke/TIAs, personally independently reviewed imaging studies and stroke evaluation results and answered questions.Continue Xarelto  (rivaroxaban ) 20 mg once daily  for secondary stroke prevention and maintain strict control of hypertension with blood pressure goal below 130/90, diabetes with hemoglobin A1c goal below 6.5% and lipids with LDL cholesterol goal below 70 mg/dL. I also advised the patient to eat a healthy diet with plenty of whole grains, cereals, fruits and vegetables, exercise regularly and maintain ideal body weight Check f/u [facility] brain for aneurysm surveillance.Followup in the future with me only if neededr or call earlier if needed.

## 2023-11-26 NOTE — Progress Notes (Signed)
 [facility] [address] [place].  [address] [phone]       OFFICE FOLLOW-UP NOTE  Mr. Blake Carey Date of Birth:  [birth year] Medical Record Number:  [id number]   HPI: Mr. Teaster is a 68 year old Hispanic male seen today for initial office follow-up visit following hospital admission for stroke in May 2021.  Is accompanied by his daughter Bobbette who translates for him today with his permission.  I personally reviewed history of presenting illness, electronic medical records and imaging films in PACS. Amay Rothman is an 68 y.o. male with a history of HTN and a-fib, on Eliquis , who presents via EMS as a Code Stroke after acute onset of right hemiplegia and aphasia while eating at a restaurant this evening. Time of onset is the same as LKW: 1845. He was witnessed by family to become non-verbal and not acting right, as well as unable to follow instructions. He has no prior history of stroke. He was not a tPA candidate as he was on eliquis . Found to have a L M1 LVO. Sent to IR for mechanical thrombectomy. With TICI 3 revascularization by Dr Alona.   Patient did very well and was kept in the ICU and extubated and had no physical deficits on exam.  CT angiogram is also shown a 5 mm anterior communicating artery aneurysm which was incidental.  MRI scan of the brain showed tiny scattered acute infarcts in the left MCA distribution mostly in the basal ganglia and punctate acute high right frontal incidental infarct.  MRA showed patent left M1 segment after treatment.  2D echo showed normal ejection fraction.  LDL cholesterol was elevated at 143 mg percent and hemoglobin A1c at 6.0.  Urine drug screen was negative.  Patient was on Eliquis  prior to admission he was initially treated with IV heparin  with plans to switch to warfarin however since patient was discharged over the weekend there was lack of ability to check his INR and to contact primary care physician to do this so  ends transition of care pharmacy arrange instead to give the patient 30-day free supply of Xarelto  and patient was discharged on this with plans to switch to warfarin after being followed by primary care physician who would also follow INR.  Patient also returned subsequently and saw Dr. CHARM Stallion who acquired is asymptomatic a common rhythm on 08/05/2019 and procedure was uneventful.  He was started on aspirin  81 in addition to the Xarelto .  Patient is tolerating both medications well with minor bruising but no bleeding.  He is recovered back to his baseline and has no complaints today.  Blood pressures well controlled at home though today it is slightly elevated in the office at 147/91.  Is tolerating Lipitor well without muscle aches and pains.  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  He has not yet seen his primary care physician to set up switching Xarelto  to warfarin and have regular INR checks.  He states he cannot afford the co-pay for his Xarelto  Update 06/16/2020: Patient is seen today for follow-up after last visit nearly 10 months ago.  Is accompanied by Spanish language interpreter Trude was present throughout this visit.  Patient states he is doing well.  He said no recurrent stroke or TIA symptoms.  He has had no new neurological or other health problems.  He had trouble affording Eliquis  since his cardiologist had switched him to Xarelto  20 mg daily which he cannot afford  the co-pay.  Is tolerating it well with only minor bruising and no bleeding.  Patient was advised at last visit to follow-up with neuro interventional radiologist to the follow-up on his cerebral aneurysms but unclear reason that has not happened.  He states his blood pressure is under good control on Norvasc  and today it is borderline in the office at 149/84.  He remains on Lipitor 40 mg daily she is tolerating well without any side effects.  He has no complaints today Update 11/13/2021 : He  returns for follow-up after last visit almost a year and a half ago.  He is accompanied by Spanish language interpreter Dorian.  Patient states he is doing well.  He has had no recurrent stroke or TIA symptoms now for 2 and half years.  He has no residual stroke deficits.  He remains on Xarelto  for his A-fib which is tolerating well without significant bleeding or bruising.  His blood pressure is well controlled with systolics in the 130-140s range at home is 148/77 in the office. Lipitor 40 mg daily which is tolerating well without muscle aches and pains.  It is unclear when his last lipid profile was checked.  He has no complaints today.SABRA  He has not kept any follow-up appointment for his cerebral aneurysm with neuro interventional radiologist. Update 11/26/2022 : He returns for follow-up after last visit 1 year ago.  He is accompanied by Spanish language interpreter who was present throughout this visit.  He continues to do well.  He has had no recurrent TIA or strokes in comes for 3 and half years.  He has no residual deficits from his stroke.  He remains independent in all activities of daily living.  He is tolerating Xarelto  well without bleeding bruising or other side effects.  States his blood pressure is under very good control.  He is tolerating Lipitor well without muscle aches and pains.  Lab work at last visit on 11/13/2021 had shown LDL cholesterol to be optimal at 62 mg percent and hemoglobin A1c was 5.8.  He denies any headaches or any other neurological symptoms.  He has no complaints today. Update 11/26/2023 : Patient returns for follow-up after last visit a year ago.  He is accompanied by Spanish language interpreter was present throughout the visit.  Continues to do well without any recurrent TIA or stroke symptoms 4-1/2 years.  He is independent in all activities of daily living.  He has no new complaints today.  Denies any recurrent TIA or stroke symptoms.  He states his blood pressure is  under good control at home but today it is elevated in office at 176/92.  He remains on Xarelto  which is tolerating well without bleeding and only minor bruising.  He is tolerating atorvastatin  well without muscle aches and pains.  He states he had follow-up lipid profile checked by primary care physician 4 months ago which was satisfactory but I do not have those results.  At last visit.  Check lab work on 11/26/2022 and LDL was slightly suboptimal at 92 mg percent and hemoglobin A1c of 5.9.  Carotid ultrasound on 12/21/22 showed no significant extracranial stenosis.He has no complaints today ROS:   14 system review of systems is positive for no complaints today  PMH:  Past Medical History:  Diagnosis Date   Acute lower UTI 06/24/2019   AKI (acute kidney injury)    05/2019 (contrast nephropathy versus obstructive urinary retention with UTI)   Anemia    low iron  Aneurysm of anterior communicating artery 06/24/2019   Atrial fibrillation (HCC)    Brain aneurysm 07/10/2019   Cerebral aneurysm    ACOM aneurysm 06/21/19   Dyslipidemia 12/03/2018   Essential hypertension 12/03/2018   History of CVA (cerebrovascular accident) 07/10/2019   HTN (hypertension)    Leukocytosis 06/24/2019   Mixed hyperlipidemia 07/10/2019   Obesity (BMI 30-39.9) 12/03/2018   Paroxysmal atrial fibrillation (HCC) 12/03/2018   Persistent atrial fibrillation (HCC) 07/10/2019   Pre-procedure lab exam 12/31/2018   Stroke (cerebrum) (HCC) 06/20/2019   Stroke (HCC)    06/20/19    Social History:  Social History   Socioeconomic History   Marital status: Unknown    Spouse name: Not on file   Number of children: Not on file   Years of education: Not on file   Highest education level: Not on file  Occupational History   Occupation: part time  Tobacco Use   Smoking status: Never   Smokeless tobacco: Never  Vaping Use   Vaping status: Never Used  Substance and Sexual Activity   Alcohol use: Not Currently     Comment: sober for over 40 years   Drug use: Never   Sexual activity: Not on file  Other Topics Concern   Not on file  Social History Narrative   Lives with wife   Right Handed    Drinks no caffeine   Social Drivers of Corporate Investment Banker Strain: Not on file  Food Insecurity: Not on file  Transportation Needs: Not on file  Physical Activity: Not on file  Stress: Not on file  Social Connections: Not on file  Intimate Partner Violence: Not on file    Medications:   Current Outpatient Medications on File Prior to Visit  Medication Sig Dispense Refill   amLODipine  (NORVASC ) 5 MG tablet Take 1 tablet (5 mg total) by mouth daily. 90 tablet 3   atorvastatin  (LIPITOR) 40 MG tablet Take 1 tablet (40 mg total) by mouth daily at 6 PM. 90 tablet 3   carvedilol  (COREG ) 12.5 MG tablet Take 1 tablet (12.5 mg total) by mouth 2 (two) times daily. 180 tablet 3   folic acid (FOLVITE) 1 MG tablet Take 1 mg by mouth daily.     losartan (COZAAR) 25 MG tablet Take 25 mg by mouth daily.     Omega-3 Fatty Acids (FISH OIL) 300 MG CAPS Take by mouth.     rivaroxaban  (XARELTO ) 20 MG TABS tablet Take 1 tablet (20 mg total) by mouth daily with supper. Take with the biggest meal of the day at the same time every day. Do not miss a dose. Set your alarm to remind you to take your Xarelto . 90 tablet 3   torsemide (DEMADEX) 10 MG tablet Take 10 mg by mouth daily.     No current facility-administered medications on file prior to visit.    Allergies:   Allergies  Allergen Reactions   Contrast Media [Iodinated Contrast Media] Other (See Comments)    Affects Kidney Function     Physical Exam General: Frail middle-aged Hispanic male, seated, in no evident distress Head: head normocephalic and atraumatic.  Neck: supple with no carotid or supraclavicular bruits Cardiovascular: regular rate and rhythm, no murmurs Musculoskeletal: no deformity Skin:  no rash/petichiae Vascular:  Normal pulses all  extremities Vitals:   11/26/23 1507  BP: (!) 176/92  Pulse: 62  SpO2: 99%   Neurologic Exam Mental Status: Awake and fully alert. Oriented to place and time.  Recent and remote memory intact. Attention span, concentration and fund of knowledge appropriate. Mood and affect appropriate.  Cranial Nerves: Fundoscopic exam not done today.  Pupils equal, briskly reactive to light. Extraocular movements full without nystagmus. Visual fields full to confrontation. Hearing intact. Facial sensation intact. Face, tongue, palate moves normally and symmetrically.  Motor: Normal bulk and tone. Normal strength in all tested extremity muscles. Sensory.: intact to touch ,pinprick .position and vibratory sensation.  Coordination: Rapid alternating movements normal in all extremities. Finger-to-nose and heel-to-shin performed accurately bilaterally. Gait and Station: Arises from chair without difficulty. Stance is normal. Gait demonstrates normal stride length and balance . Able to heel, toe and tandem walk without difficulty.  Reflexes: 1+ and symmetric. Toes downgoing.      ASSESSMENT: 68 year old Hispanic male with left MCA infarct in May 2021 due to left MCA occlusion s/p mechanical thrombectomy due to atrial fibrillation.  Also incidental 5 mm anterior communicating artery aneurysm s/p endovascular coiling treatment on 08/05/2019.  He appears stable from neurovascular standpoint     PLAN: I had a long d/w patient using a spanish language interpreter about his recent stroke, atrial fibrillation,risk for recurrent stroke/TIAs, personally independently reviewed imaging studies and stroke evaluation results and answered questions.Continue Xarelto  (rivaroxaban ) 20 mg once daily  for secondary stroke prevention and maintain strict control of hypertension with blood pressure goal below 130/90, diabetes with hemoglobin A1c goal below 6.5% and lipids with LDL cholesterol goal below 70 mg/dL. I also advised the  patient to eat a healthy diet with plenty of whole grains, cereals, fruits and vegetables, exercise regularly and maintain ideal body weight Check f/u MRA brain for aneurysm surveillance.Followup in the future with me only if neededr or call earlier if needed.   I personally spent a total of 35 minutes in the care of the patient today including getting/reviewing separately obtained history, performing a medically appropriate exam/evaluation, counseling and educating, placing orders, referring and communicating with other health care professionals, documenting clinical information in the EHR, independently interpreting results, and coordinating care.        Eather Popp, MD  [facility] [address] [place], [place] [address]  Phone [phone] Fax [phone]: This document was prepared with digital dictation and possible smart phrase technology. Any transcriptional errors that result from this process are unintentional

## 2024-02-03 ENCOUNTER — Encounter: Payer: Self-pay | Admitting: Cardiology

## 2024-02-04 MED ORDER — RIVAROXABAN 20 MG PO TABS: 20.0000 mg | ORAL_TABLET | Freq: Every day | ORAL | 0 refills | Status: AC
